# Patient Record
Sex: Male | Born: 1955
Health system: Southern US, Community
[De-identification: ages and names within clinical notes are randomized; demographics above are authoritative.]

## PROBLEM LIST (undated history)

## (undated) DIAGNOSIS — N189 Chronic kidney disease, unspecified: Secondary | ICD-10-CM

## (undated) DIAGNOSIS — F32A Depression, unspecified: Secondary | ICD-10-CM

## (undated) DIAGNOSIS — F329 Major depressive disorder, single episode, unspecified: Secondary | ICD-10-CM

## (undated) DIAGNOSIS — M1990 Unspecified osteoarthritis, unspecified site: Secondary | ICD-10-CM

## (undated) DIAGNOSIS — I10 Essential (primary) hypertension: Secondary | ICD-10-CM

## (undated) HISTORY — DX: Unspecified osteoarthritis, unspecified site: M19.90

## (undated) HISTORY — PX: TONSILLECTOMY: SUR1361

## (undated) HISTORY — DX: Essential (primary) hypertension: I10

## (undated) HISTORY — DX: Chronic kidney disease, unspecified: N18.9

---

## 2011-10-13 ENCOUNTER — Emergency Department (HOSPITAL_COMMUNITY)
Admission: EM | Admit: 2011-10-13 | Discharge: 2011-10-13 | Disposition: A | Discharge status: Home / Self Care | Payer: Self-pay | Attending: Emergency Medicine | Admitting: Emergency Medicine

## 2011-10-13 ENCOUNTER — Encounter (HOSPITAL_COMMUNITY): Payer: Self-pay | Admitting: *Deleted

## 2011-10-13 DIAGNOSIS — S91009A Unspecified open wound, unspecified ankle, initial encounter: Secondary | ICD-10-CM | POA: Insufficient documentation

## 2011-10-13 DIAGNOSIS — W01119A Fall on same level from slipping, tripping and stumbling with subsequent striking against unspecified sharp object, initial encounter: Secondary | ICD-10-CM | POA: Insufficient documentation

## 2011-10-13 DIAGNOSIS — W278XXA Contact with other nonpowered hand tool, initial encounter: Secondary | ICD-10-CM | POA: Insufficient documentation

## 2011-10-13 DIAGNOSIS — IMO0002 Reserved for concepts with insufficient information to code with codable children: Secondary | ICD-10-CM

## 2011-10-13 DIAGNOSIS — S81009A Unspecified open wound, unspecified knee, initial encounter: Secondary | ICD-10-CM | POA: Insufficient documentation

## 2011-10-13 MED ORDER — CEPHALEXIN 500 MG PO CAPS: 500.0000 mg | ORAL_CAPSULE | Freq: Two times a day (BID) | ORAL | Status: DC

## 2011-10-13 NOTE — ED Provider Notes (Signed)
History  This chart was scribed for Eric Gaskins, MD by Ladona Ridgel Day. This patient was seen in room TR06C/TR06C and the patient's care was started at 1332.   CSN: [id number]  Arrival date & time 10/13/11  1332   First MD Initiated Contact with Patient 10/13/11 1359      Chief Complaint  Patient presents with  . Extremity Laceration   Patient is a 56 y.o. male presenting with skin laceration. The history is provided by the patient. No language interpreter was used.  Laceration  The incident occurred 1 to 2 hours ago. The laceration is located on the left leg. The laceration is 1 cm in size. The laceration mechanism was a a metal edge. The pain is mild. The pain has been constant since onset. It is unknown if a foreign body is present. His tetanus status is UTD.   Eric Crane is a 56 y.o. male who presents to the Emergency Department complaining of LLE laceration from the hitch of a lawnmower after falling while operating it today. His TD is UTD. He states mild blood loss but he wrapped it with an ace bandage to prevent any subsequent bleeding. He denies any other injuries. He states able to move his left foot normally and has normal sensation, no tingling or numbness.  PMH  History reviewed. No pertinent past surgical history.  History reviewed. No pertinent family history.  History  Substance Use Topics  . Smoking status: Never Smoker   . Smokeless tobacco: Not on file  . Alcohol Use: No      Review of Systems  Constitutional: Negative for fever.  Respiratory: Negative for shortness of breath.   Cardiovascular: Negative for chest pain.  Gastrointestinal: Negative for abdominal pain.  Skin: Positive for wound (laceration LLE).    Allergies  Review of patient's allergies indicates no known allergies.  Home Medications   Current Outpatient Rx  Name Route Sig Dispense Refill  . B COMPLEX PO TABS Oral Take 1 tablet by mouth daily.    Marland Kitchen GLUCOSAMINE COMPLEX PO Oral  Take 1 tablet by mouth daily.    . CO Q 10 100 MG PO CAPS Oral Take 1 capsule by mouth daily.    Marland Kitchen DHEA 50 MG PO CAPS Oral Take 1 capsule by mouth daily.    . OMEGA-3 FATTY ACIDS 1000 MG PO CAPS Oral Take 1 g by mouth daily.    Marland Kitchen MAGNESIUM 100 MG PO CAPS Oral Take 1 capsule by mouth daily.    . MULTI-VITAMIN/MINERALS PO TABS Oral Take 1 tablet by mouth daily.    Marland Kitchen VITAMIN E 400 UNITS PO CAPS Oral Take 400 Units by mouth daily.    Marland Kitchen ZINC 50 MG PO CAPS Oral Take 1 capsule by mouth daily.      Triage Vitals: BP 160/79  Pulse 55  Temp 98 F (36.7 C) (Oral)  Resp 17  SpO2 98%  Physical Exam CONSTITUTIONAL: Well developed/well nourished HEAD AND FACE: Normocephalic/atraumatic EYES: EOMI/PERRL ENMT: Mucous membranes moist NECK: supple no meningeal signs SPINE:entire spine nontender CV: S1/S2 noted, no murmurs/rubs/gallops noted LUNGS: Lungs are clear to auscultation bilaterally, no apparent distress ABDOMEN: soft, nontender, no rebound or guarding GU:no cva tenderness NEURO: Pt is awake/alert, moves all extremitiesx4 EXTREMITIES: pulses normal, full ROM.  Full rom of left ankle and no focal weakness noted with power of left foot/toes SKIN: warm, color normal, a 2 cm laceration to distal aspect of left tibia.  No bone/tendon exposed PSYCH: no  abnormalities of mood noted ED Course  LACERATION REPAIR Date/Time: 10/13/2011 2:40 PM Performed by: Manuela Schwartz Authorized by: Eric Crane Consent: Verbal consent obtained. Body area: lower extremity Location details: left lower leg Foreign bodies: no foreign bodies Tendon involvement: none Nerve involvement: none Anesthesia: local infiltration Local anesthetic: lidocaine 1% with epinephrine Irrigation solution: saline Irrigation method: syringe Amount of cleaning: standard Debridement: none Wound skin closure material used: 7 staples. Patient tolerance: Patient tolerated the procedure well with no immediate  complications. Comments: Prepped with betadine and rinsed with high pressure saline. Placed 7 staples on 2 cm LLE laceration. Patient tolerated the procedure well with no immediate complications.    DIAGNOSTIC STUDIES: Oxygen Saturation is 98% on room air, normal by my interpretation.    COORDINATION OF CARE: At 210 PM Discussed treatment plan with patient which includes wound . Patient agrees.   Pt requesting oral abx for fear of wound infection.  Xray deferred as no bony tenderness and thoroughly cleansed and no foreign bodies noted     MDM  Nursing notes including past medical history and social history reviewed and considered in documentation    I personally performed the services described in this documentation, which was scribed in my presence. The recorded information has been reviewed and considered.          Eric Gaskins, MD 10/13/11 [phone]

## 2011-10-13 NOTE — ED Notes (Addendum)
Pt with laceration to left lower extremity, sustained by lawn mower. Reports turned lawn mower off and the mower got caught on a rock and pts leg went under leg. CMS intact distally. On exam laceration appears to not have involved tendons and bones. Pt is able to flex and extend foot without difficulty.

## 2011-10-13 NOTE — ED Notes (Signed)
MD in room now .  ?

## 2011-10-13 NOTE — ED Notes (Signed)
NAD noted at time of d/c home 

## 2015-10-02 ENCOUNTER — Other Ambulatory Visit: Payer: Self-pay | Admitting: Gastroenterology

## 2015-11-06 NOTE — Progress Notes (Signed)
Spoke with pt today-states" procedure has been cancelled for 11-12-15, has spoken to Dr. Henriette CombsJohnson's office, will reschedule at a later time"

## 2015-11-12 ENCOUNTER — Ambulatory Visit (HOSPITAL_COMMUNITY): Admission: RE | Admit: 2015-11-12 | Payer: 59 | Source: Ambulatory Visit | Admitting: Gastroenterology

## 2015-11-12 ENCOUNTER — Encounter (HOSPITAL_COMMUNITY): Admission: RE | Payer: Self-pay | Source: Ambulatory Visit

## 2015-11-12 SURGERY — COLONOSCOPY WITH PROPOFOL
Anesthesia: Monitor Anesthesia Care

## 2016-01-09 ENCOUNTER — Other Ambulatory Visit: Payer: Self-pay | Admitting: Gastroenterology

## 2016-02-17 ENCOUNTER — Encounter (HOSPITAL_COMMUNITY): Payer: Self-pay

## 2016-02-18 ENCOUNTER — Ambulatory Visit (HOSPITAL_COMMUNITY)
Admission: RE | Admit: 2016-02-18 | Discharge: 2016-02-18 | Disposition: A | Discharge status: Home / Self Care | Payer: 59 | Source: Ambulatory Visit | Attending: Gastroenterology | Admitting: Gastroenterology

## 2016-02-18 ENCOUNTER — Ambulatory Visit (HOSPITAL_COMMUNITY): Payer: 59 | Admitting: Anesthesiology

## 2016-02-18 ENCOUNTER — Encounter (HOSPITAL_COMMUNITY): Payer: Self-pay

## 2016-02-18 ENCOUNTER — Encounter (HOSPITAL_COMMUNITY)
Admission: RE | Disposition: A | Discharge status: Home / Self Care | Payer: Self-pay | Source: Ambulatory Visit | Attending: Gastroenterology

## 2016-02-18 DIAGNOSIS — F319 Bipolar disorder, unspecified: Secondary | ICD-10-CM | POA: Diagnosis not present

## 2016-02-18 DIAGNOSIS — F329 Major depressive disorder, single episode, unspecified: Secondary | ICD-10-CM | POA: Insufficient documentation

## 2016-02-18 DIAGNOSIS — Z1211 Encounter for screening for malignant neoplasm of colon: Secondary | ICD-10-CM | POA: Insufficient documentation

## 2016-02-18 DIAGNOSIS — Z86010 Personal history of colonic polyps: Secondary | ICD-10-CM | POA: Insufficient documentation

## 2016-02-18 DIAGNOSIS — J45909 Unspecified asthma, uncomplicated: Secondary | ICD-10-CM | POA: Diagnosis not present

## 2016-02-18 HISTORY — DX: Major depressive disorder, single episode, unspecified: F32.9

## 2016-02-18 HISTORY — DX: Depression, unspecified: F32.A

## 2016-02-18 HISTORY — PX: COLONOSCOPY WITH PROPOFOL: SHX5780

## 2016-02-18 SURGERY — COLONOSCOPY WITH PROPOFOL
Anesthesia: Monitor Anesthesia Care

## 2016-02-18 MED ORDER — SODIUM CHLORIDE 0.9 % IV SOLN: INTRAVENOUS | Status: DC

## 2016-02-18 MED ORDER — PROPOFOL 500 MG/50ML IV EMUL
INTRAVENOUS | Status: DC | PRN
  Administered 2016-02-18: 100 ug/kg/min via INTRAVENOUS

## 2016-02-18 MED ORDER — PROPOFOL 10 MG/ML IV BOLUS
INTRAVENOUS | Status: AC
  Filled 2016-02-18: qty 40

## 2016-02-18 MED ORDER — LACTATED RINGERS IV SOLN
INTRAVENOUS | Status: DC
Start: 2016-02-18 — End: 2016-02-18
  Administered 2016-02-18: 13:00:00 via INTRAVENOUS

## 2016-02-18 MED ORDER — PROPOFOL 500 MG/50ML IV EMUL
INTRAVENOUS | Status: DC | PRN
  Administered 2016-02-18: 50 mg via INTRAVENOUS
  Administered 2016-02-18: 10 mg via INTRAVENOUS

## 2016-02-18 SURGICAL SUPPLY — 21 items

## 2016-02-18 NOTE — Transfer of Care (Signed)
Immediate Anesthesia Transfer of Care Note  Patient: Eric Crane  Procedure(s) Performed: Procedure(s): COLONOSCOPY WITH PROPOFOL (N/A)  Patient Location: PACU  Anesthesia Type:MAC  Level of Consciousness: awake, alert  and oriented  Airway & Oxygen Therapy: Patient Spontanous Breathing and Patient connected to face mask oxygen  Post-op Assessment: Report given to RN and Post -op Vital signs reviewed and stable  Post vital signs: Reviewed and stable  Last Vitals:  Vitals:   02/18/16 1241  BP: 139/65  Pulse: 60  Resp: 10  Temp: 36.4 C    Last Pain:  Vitals:   02/18/16 1241  TempSrc: Oral         Complications: No apparent anesthesia complications

## 2016-02-18 NOTE — Op Note (Signed)
[facility] Patient Name: Eric Crane Procedure Date: 02/18/2016 MRN: [id number] Attending MD: Charolett Bumpers , MD Date of Birth: 1955/08/21 CSN: [id number] Age: 61 Admit Type: Outpatient Procedure:                Colonoscopy Indications:              High risk colon cancer surveillance: Personal                            history of non-advanced adenoma Providers:                Charolett Bumpers, MD, Omelia Blackwater RN, RN,                            [facility] Tech, Technician, Roni Bread,                            CRNA Referring MD:              Medicines:                Propofol per Anesthesia Complications:            No immediate complications. Estimated Blood Loss:     Estimated blood loss: none. Procedure:                Pre-Anesthesia Assessment:                           - Prior to the procedure, a History and Physical                            was performed, and patient medications and                            allergies were reviewed. The patient's tolerance of                            previous anesthesia was also reviewed. The risks                            and benefits of the procedure and the sedation                            options and risks were discussed with the patient.                            All questions were answered, and informed consent                            was obtained. Prior Anticoagulants: The patient has                            taken aspirin, last dose was 1 day prior to                            procedure.  ASA Grade Assessment: II - A patient                            with mild systemic disease. After reviewing the                            risks and benefits, the patient was deemed in                            satisfactory condition to undergo the procedure.                           After obtaining informed consent, the colonoscope                            was passed under direct vision.  Throughout the                            procedure, the patient's blood pressure, pulse, and                            oxygen saturations were monitored continuously. The                            EC-[id number] ([id number] scope was introduced through                            the anus and advanced to the the cecum, identified                            by appendiceal orifice and ileocecal valve. The                            colonoscopy was performed without difficulty. The                            patient tolerated the procedure well. The quality                            of the bowel preparation was good. The appendiceal                            orifice and the rectum were photographed. Scope In: 1:44:58 PM Scope Out: 2:08:08 PM Scope Withdrawal Time: 0 hours 12 minutes 58 seconds  Total Procedure Duration: 0 hours 23 minutes 10 seconds  Findings:      The perianal and digital rectal examinations were normal.      The entire examined colon appeared normal. Impression:               - The entire examined colon is normal.                           - No specimens collected. Moderate Sedation:      N/A- Per Anesthesia Care Recommendation:           -  Patient has a contact number available for                            emergencies. The signs and symptoms of potential                            delayed complications were discussed with the                            patient. Return to normal activities tomorrow.                            Written discharge instructions were provided to the                            patient.                           - Repeat colonoscopy in 5 years for surveillance.                           - Resume previous diet.                           - Continue present medications. Procedure Code(s):        --- Professional ---                           Z6109, Colorectal cancer screening; colonoscopy on                            individual at high  risk Diagnosis Code(s):        --- Professional ---                           Z86.010, Personal history of colonic polyps CPT copyright 2016 American Medical Association. All rights reserved. The codes documented in this report are preliminary and upon coder review may  be revised to meet current compliance requirements. Danise Edge, MD Charolett Bumpers, MD 02/18/2016 2:18:22 PM This report has been signed electronically. Number of Addenda: 0

## 2016-02-18 NOTE — Anesthesia Preprocedure Evaluation (Signed)
Anesthesia Evaluation  Patient identified by MRN, date of birth, ID band Patient awake    Reviewed: Allergy & Precautions, NPO status , Patient's Chart, lab work & pertinent test results  History of Anesthesia Complications Negative for: history of anesthetic complications  Airway Mallampati: II  TM Distance: >3 FB Neck ROM: Full    Dental  (+) Upper Dentures, Lower Dentures   Pulmonary neg pulmonary ROS,    breath sounds clear to auscultation       Cardiovascular negative cardio ROS   Rhythm:Regular     Neuro/Psych PSYCHIATRIC DISORDERS Depression negative neurological ROS     GI/Hepatic negative GI ROS, Neg liver ROS,   Endo/Other  negative endocrine ROS  Renal/GU negative Renal ROS     Musculoskeletal negative musculoskeletal ROS (+)   Abdominal   Peds  Hematology negative hematology ROS (+)   Anesthesia Other Findings   Reproductive/Obstetrics                             Anesthesia Physical Anesthesia Plan  ASA: II  Anesthesia Plan: MAC   Post-op Pain Management:    Induction: Intravenous  Airway Management Planned: Natural Airway, Nasal Cannula and Simple Face Mask  Additional Equipment: None  Intra-op Plan:   Post-operative Plan:   Informed Consent: I have reviewed the patients History and Physical, chart, labs and discussed the procedure including the risks, benefits and alternatives for the proposed anesthesia with the patient or authorized representative who has indicated his/her understanding and acceptance.   Dental advisory given  Plan Discussed with: CRNA and Surgeon  Anesthesia Plan Comments:         Anesthesia Quick Evaluation

## 2016-02-18 NOTE — H&P (Signed)
Procedure: Surveillance colonoscopy. History of adenomatous colon polyps removed colonoscopically in the past  History: The patient is a 61 year old male born [birth year]. He is scheduled to undergo a surveillance colonoscopy today.  Past medical history: Right arm fracture surgery performed at age 61. Bipolar disorder. Asthma.  Medication allergies: None  Exam: The patient is alert and lying comfortably on the endoscopy stretcher. Abdomen is soft and nontender to palpation. Lungs are clear to auscultation. Cardiac exam reveals a regular rhythm  Plan: Proceed with surveillance colonoscopy

## 2016-02-18 NOTE — Discharge Instructions (Signed)

## 2016-02-19 ENCOUNTER — Encounter (HOSPITAL_COMMUNITY): Payer: Self-pay | Admitting: Gastroenterology

## 2016-02-19 NOTE — Anesthesia Postprocedure Evaluation (Addendum)
Anesthesia Post Note  Patient: Eric Crane  Procedure(s) Performed: Procedure(s) (LRB): COLONOSCOPY WITH PROPOFOL (N/A)  Patient location during evaluation: PACU Anesthesia Type: MAC Level of consciousness: awake and alert Pain management: pain level controlled Vital Signs Assessment: post-procedure vital signs reviewed and stable Respiratory status: spontaneous breathing, nonlabored ventilation, respiratory function stable and patient connected to nasal cannula oxygen Cardiovascular status: stable and blood pressure returned to baseline Anesthetic complications: no       Last Vitals:  Vitals:   02/18/16 1417 02/18/16 1440  BP: 131/70 (!) 173/88  Pulse: 63   Resp: 12   Temp: 36.4 C     Last Pain:  Vitals:   02/18/16 1417  TempSrc: Oral                 Keyshun Elpers

## 2016-03-09 ENCOUNTER — Other Ambulatory Visit: Payer: Self-pay | Admitting: Internal Medicine

## 2016-03-09 ENCOUNTER — Ambulatory Visit
Admission: RE | Admit: 2016-03-09 | Discharge: 2016-03-09 | Disposition: A | Discharge status: Home / Self Care | Payer: 59 | Source: Ambulatory Visit | Attending: Internal Medicine | Admitting: Internal Medicine

## 2016-03-09 DIAGNOSIS — R1032 Left lower quadrant pain: Secondary | ICD-10-CM

## 2016-03-09 DIAGNOSIS — M25552 Pain in left hip: Secondary | ICD-10-CM | POA: Diagnosis not present

## 2016-06-01 DIAGNOSIS — H40013 Open angle with borderline findings, low risk, bilateral: Secondary | ICD-10-CM | POA: Diagnosis not present

## 2016-06-29 NOTE — Addendum Note (Signed)
Addendum  created 06/29/16 1024 by Val EagleMoser, Jermiya Reichl, MD   Sign clinical note

## 2016-09-29 DIAGNOSIS — Z0000 Encounter for general adult medical examination without abnormal findings: Secondary | ICD-10-CM | POA: Diagnosis not present

## 2016-09-29 DIAGNOSIS — E7800 Pure hypercholesterolemia, unspecified: Secondary | ICD-10-CM | POA: Diagnosis not present

## 2016-09-29 DIAGNOSIS — Z5181 Encounter for therapeutic drug level monitoring: Secondary | ICD-10-CM | POA: Diagnosis not present

## 2016-12-14 DIAGNOSIS — H40013 Open angle with borderline findings, low risk, bilateral: Secondary | ICD-10-CM | POA: Diagnosis not present

## 2017-06-14 DIAGNOSIS — H40013 Open angle with borderline findings, low risk, bilateral: Secondary | ICD-10-CM | POA: Diagnosis not present

## 2017-10-04 DIAGNOSIS — E7800 Pure hypercholesterolemia, unspecified: Secondary | ICD-10-CM | POA: Diagnosis not present

## 2017-10-04 DIAGNOSIS — R739 Hyperglycemia, unspecified: Secondary | ICD-10-CM | POA: Diagnosis not present

## 2017-12-15 DIAGNOSIS — H40013 Open angle with borderline findings, low risk, bilateral: Secondary | ICD-10-CM | POA: Diagnosis not present

## 2018-01-25 DIAGNOSIS — H6123 Impacted cerumen, bilateral: Secondary | ICD-10-CM | POA: Diagnosis not present

## 2018-06-15 DIAGNOSIS — H40023 Open angle with borderline findings, high risk, bilateral: Secondary | ICD-10-CM | POA: Diagnosis not present

## 2018-12-30 ENCOUNTER — Ambulatory Visit (INDEPENDENT_AMBULATORY_CARE_PROVIDER_SITE_OTHER): Payer: 59 | Admitting: Physician Assistant

## 2018-12-30 ENCOUNTER — Encounter: Payer: Self-pay | Admitting: Physician Assistant

## 2018-12-30 ENCOUNTER — Ambulatory Visit: Payer: Self-pay

## 2018-12-30 ENCOUNTER — Other Ambulatory Visit: Payer: Self-pay

## 2018-12-30 ENCOUNTER — Ambulatory Visit (INDEPENDENT_AMBULATORY_CARE_PROVIDER_SITE_OTHER): Payer: 59

## 2018-12-30 VITALS — Ht 68.0 in | Wt 183.0 lb

## 2018-12-30 DIAGNOSIS — M25561 Pain in right knee: Secondary | ICD-10-CM | POA: Diagnosis not present

## 2018-12-30 NOTE — Progress Notes (Addendum)
Office Visit Note   Patient: Eric Crane           Date of Birth: 1955/10/28           MRN: [id number] Visit Date: 12/30/2018              Requested by: Kirby Funk, MD 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    Assessment & Plan: Visit Diagnoses:  1. Right knee pain, unspecified chronicity     Plan: Clinical exam and history most consistent with arthritis of the right knee with a ruptured Baker's cyst.  I would like for him to work on ankle range of motion and I also would like for him to use a compression sock 24/7.  He does not have any pain with active or passive dorsiflexion of the ankle and a negative Homans' sign and no focal tenderness in the back of the calf.  However I did tell him if these symptoms changed or he began having the symptoms he needed to be evaluated urgently he will follow up in 4 weeks  Follow-Up Instructions: No follow-ups on file.   Ortho Exam  Patient is alert, oriented, no adenopathy, well-dressed, normal affect, normal respiratory effort. Right knee moderate amount of soft tissue swelling extending from the knee into the foot both his dorsalis pedis and his posterior tibial pulses are palpable and strong.  He has a moderate amount of soft tissue swelling but no discoloration.  He does also have some pain over the medial joint line and varus alignment of his leg.  Compartments are  compressible.Distal sensation is intact. No pain in calf with passive stretch. Active Ankle ROM is intact.   Imaging: No results found. No images are attached to the encounter.  Labs: No results found for: HGBA1C, ESRSEDRATE, CRP, LABURIC, REPTSTATUS, GRAMSTAIN, CULT, LABORGA   No results found for: ALBUMIN, PREALBUMIN, LABURIC  No results found for: MG No results found for: VD25OH  No results found for: PREALBUMIN No flowsheet data found.   Body mass index is 27.83 kg/m.  Orders:  Orders Placed This Encounter  Procedures   XR Knee 1-2 Views Right   XR Tibia/Fibula Right   No orders of the defined types were placed in this encounter.    Procedures: No procedures performed  Clinical Data: No additional findings.  ROS:  All other systems negative, except as noted in the HPI. Review of Systems  Objective: Vital Signs: Ht 5\' 8"  (1.727 m)    Wt 183 lb (83 kg)    BMI 27.83 kg/m   Specialty Comments:  No specialty comments available.  PMFS History: There are no active problems to display for this patient.  Past Medical History:  Diagnosis Date   Depression     No family history on file.  Past Surgical History:  Procedure Laterality Date  COLONOSCOPY WITH PROPOFOL N/A 02/18/2016   Procedure: COLONOSCOPY WITH PROPOFOL;  Surgeon: Garlan Fair, MD;  Location: [facility];  Service: Endoscopy;  Laterality: N/A;   Social History   Occupational History   Not on file  Tobacco Use   Smoking status: Never Smoker   Smokeless tobacco: Never Used  Substance and Sexual Activity   Alcohol use: No   Drug use: No   Sexual activity: Not on file

## 2019-01-02 ENCOUNTER — Telehealth: Payer: Self-pay | Admitting: Physician Assistant

## 2019-01-02 MED ORDER — IBUPROFEN 800 MG PO TABS: 800.0000 mg | ORAL_TABLET | Freq: Three times a day (TID) | ORAL | 1 refills | Status: DC | PRN

## 2019-01-02 NOTE — Telephone Encounter (Signed)
Patient called and requesting am inflammatory medication for swelling in legs. Patient states he has taken Advil and tylenol and swelling has not gone down. Patient seen PA Audrea Muscat Person 12/30/2018. Patient would like medication to be sent to [facility] in [place]. Patient phone number is [phone]

## 2019-01-02 NOTE — Telephone Encounter (Signed)
I left a message for the patient. He is supposed to be wearing a compression sock. If he is and the swelling is not getting better I would like for him to get a doppler US of the leg. If He feels he is getting better but still has swelling we can try  Ibuprofen 600 TID with food 90.

## 2019-01-02 NOTE — Telephone Encounter (Signed)
Ok, thank you

## 2019-01-02 NOTE — Addendum Note (Signed)
Addended by: Georgette Dover on: 01/02/2019 03:19 PM   Modules accepted: Orders

## 2019-01-02 NOTE — Telephone Encounter (Signed)
Swelling is actually getting better. He just would like the NSAID to help things along

## 2019-01-02 NOTE — Telephone Encounter (Signed)
Eric Crane please advise, thank you. 

## 2019-01-13 ENCOUNTER — Other Ambulatory Visit: Payer: Self-pay

## 2019-01-13 ENCOUNTER — Ambulatory Visit (INDEPENDENT_AMBULATORY_CARE_PROVIDER_SITE_OTHER): Payer: 59 | Admitting: Physician Assistant

## 2019-01-13 ENCOUNTER — Encounter: Payer: Self-pay | Admitting: Physician Assistant

## 2019-01-13 VITALS — Ht 68.0 in | Wt 183.0 lb

## 2019-01-13 DIAGNOSIS — M25561 Pain in right knee: Secondary | ICD-10-CM

## 2019-01-13 MED ORDER — METHYLPREDNISOLONE ACETATE 40 MG/ML IJ SUSP
40.0000 mg | INTRAMUSCULAR | Status: AC | PRN
  Administered 2019-01-13: 15:00:00 40 mg via INTRA_ARTICULAR

## 2019-01-13 MED ORDER — LIDOCAINE HCL 1 % IJ SOLN
5.0000 mL | INTRAMUSCULAR | Status: AC | PRN
  Administered 2019-01-13: 5 mL

## 2019-01-13 NOTE — Progress Notes (Signed)
Office Visit Note   Patient: Eric Crane           Date of Birth: 1955/11/22           MRN: [id number] Visit Date: 01/13/2019              Requested by: Lavone Orn, MD [address] [place],  [place] [address] PCP: Lavone Orn, MD  Chief Complaint  Patient presents with  . Right Knee - Pain      HPI: This is a pleasant 63 year old gentleman that I have been following for right knee pain and lower leg swelling the swelling in his calf has gotten significantly better with compression socks.  But still remains is aching and pain in his knee.  He focuses around the joint line.  He denies any locking or popping he does feel like he still has a little swelling and going down stairs is difficult he denies any instability  Assessment & Plan: Visit Diagnoses: No diagnosis found.  Plan: He will follow up in 3 weeks.  If he does not have significant relief I told him the next step may be an MRI.  He is in agreement with the plan  Follow-Up Instructions: No follow-ups on file.   Ortho Exam  Patient is alert, oriented, no adenopathy, well-dressed, normal affect, normal respiratory effort. Right knee: Mild soft tissue swelling no cellulitis or erythema tenderness along the joint line increased with terminal flexion  Imaging: No results found. No images are attached to the encounter.  Labs: No results found for: HGBA1C, ESRSEDRATE, CRP, LABURIC, REPTSTATUS, GRAMSTAIN, CULT, LABORGA   No results found for: ALBUMIN, PREALBUMIN, LABURIC  No results found for: MG No results found for: VD25OH  No results found for: PREALBUMIN No flowsheet data found.   Body mass index is 27.83 kg/m.  Orders:  No orders of the defined types were placed in this encounter.  No orders of the defined types were placed in this encounter.    Procedures: Large Joint Inj: R knee on 01/13/2019 3:20 PM Indications: pain and diagnostic evaluation Details: 22 G 1.5 in needle,  anteromedial approach  Arthrogram: No  Medications: 40 mg methylPREDNISolone acetate 40 MG/ML; 5 mL lidocaine 1 % Outcome: tolerated well, no immediate complications Procedure, treatment alternatives, risks and benefits explained, specific risks discussed. Consent was given by the patient. Immediately prior to procedure a time out was called to verify the correct patient, procedure, equipment, support staff and site/side marked as required. Patient was prepped and draped in the usual sterile fashion.      Clinical Data: No additional findings.  ROS:  All other systems negative, except as noted in the HPI. Review of Systems  Objective: Vital Signs: Ht 5\' 8"  (1.727 m)   Wt 183 lb (83 kg)   BMI 27.83 kg/m   Specialty Comments:  No specialty comments available.  PMFS History: There are no problems to display for this patient.  Past Medical History:  Diagnosis Date  . Depression     No family history on file.  Past Surgical History:  Procedure Laterality Date  . COLONOSCOPY WITH PROPOFOL N/A 02/18/2016   Procedure: COLONOSCOPY WITH PROPOFOL;  Surgeon: Garlan Fair, MD;  Location: [facility];  Service: Endoscopy;  Laterality: N/A;   Social History   Occupational History  . Not on file  Tobacco Use  . Smoking status: Never Smoker  . Smokeless tobacco: Never Used  Substance and Sexual Activity  . Alcohol  use: No  . Drug use: No  . Sexual activity: Not on file

## 2019-02-03 ENCOUNTER — Ambulatory Visit: Payer: 59 | Admitting: Physician Assistant

## 2019-02-07 ENCOUNTER — Encounter: Payer: Self-pay | Admitting: Physician Assistant

## 2019-02-07 ENCOUNTER — Other Ambulatory Visit: Payer: Self-pay

## 2019-02-07 ENCOUNTER — Ambulatory Visit (INDEPENDENT_AMBULATORY_CARE_PROVIDER_SITE_OTHER): Payer: 59 | Admitting: Physician Assistant

## 2019-02-07 VITALS — Ht 68.0 in | Wt 183.0 lb

## 2019-02-07 DIAGNOSIS — M25561 Pain in right knee: Secondary | ICD-10-CM

## 2019-02-07 MED ORDER — IBUPROFEN 800 MG PO TABS: 800.0000 mg | ORAL_TABLET | Freq: Two times a day (BID) | ORAL | 2 refills | Status: DC | PRN

## 2019-02-07 NOTE — Progress Notes (Signed)
Office Visit Note   Patient: Eric Crane           Date of Birth: Mar 08, 1955           MRN: [id number] Visit Date: 02/07/2019              Requested by: Kirby Funk, MD [address] [place],  [place] [address] PCP: Kirby Funk, MD  Chief Complaint  Patient presents with  . Right Knee - Follow-up    01/13/19 s/p cortisone injection       HPI: This is a pleasant 64 year old gentleman seen today in follow-up for right knee pain as well as some swelling to his right lower leg.  He has had good relief of the swelling with compression garments and is wearing these today.  He continues to have some aching medial knee pain.  With occasional swelling and pain extending into the popliteal fossa.  He did have a Depo-Medrol injection about 3 weeks ago which she states has worked well he is now having a variable amount of aching knee pain this is worse with weightbearing.  He feels comfortable tolerating this level of pain.  Would not like to proceed with any further evaluation.   Assessment & Plan: Visit Diagnoses: No diagnosis found.  Plan: He will follow up as needed.  We will refill his ibuprofen as this is providing him adequate relief.  Follow-Up Instructions: Return if symptoms worsen or fail to improve.   Ortho Exam  Patient is alert, oriented, no adenopathy, well-dressed, normal affect, normal respiratory effort. Exam unchanged. Right knee: Mild soft tissue swelling no cellulitis or erythema. tenderness along the joint line increased with terminal flexion  Imaging: No results found. No images are attached to the encounter.  Labs: No results found for: HGBA1C, ESRSEDRATE, CRP, LABURIC, REPTSTATUS, GRAMSTAIN, CULT, LABORGA   No results found for: ALBUMIN, PREALBUMIN, LABURIC  No results found for: MG No results found for: VD25OH  No results found for: PREALBUMIN No flowsheet data found.   Body mass index is 27.83 kg/m.  Orders:  No orders of  the defined types were placed in this encounter.  Meds ordered this encounter  Medications  . ibuprofen (ADVIL) 800 MG tablet    Sig: Take 1 tablet (800 mg total) by mouth 2 (two) times daily as needed.    Dispense:  60 tablet    Refill:  2     Procedures: No procedures performed  Clinical Data: No additional findings.  ROS:  All other systems negative, except as noted in the HPI. Review of Systems  Constitutional: Negative for chills and fever.  Musculoskeletal: Positive for arthralgias and joint swelling.    Objective: Vital Signs: Ht 5\' 8"  (1.727 m)   Wt 183 lb (83 kg)   BMI 27.83 kg/m   Specialty Comments:  No specialty comments available.  PMFS History: There are no problems to display for this patient.  Past Medical History:  Diagnosis Date  . Depression     History reviewed. No pertinent family history.  Past Surgical History:  Procedure Laterality Date  . COLONOSCOPY WITH PROPOFOL N/A 02/18/2016   Procedure: COLONOSCOPY WITH PROPOFOL;  Surgeon: 02/20/2016, MD;  Location: [facility];  Service: Endoscopy;  Laterality: N/A;   Social History   Occupational History  . Not on file  Tobacco Use  . Smoking status: Never Smoker  . Smokeless tobacco: Never Used  Substance and Sexual Activity  . Alcohol use: No  .  Drug use: No  . Sexual activity: Not on file       

## 2019-02-24 ENCOUNTER — Other Ambulatory Visit: Payer: Self-pay | Admitting: Family

## 2019-02-24 MED ORDER — IBUPROFEN 800 MG PO TABS: 800.0000 mg | ORAL_TABLET | Freq: Two times a day (BID) | ORAL | 0 refills | Status: DC | PRN

## 2019-04-24 ENCOUNTER — Telehealth: Payer: Self-pay | Admitting: Physician Assistant

## 2019-04-24 NOTE — Telephone Encounter (Signed)
Pls advise. Thanks.  

## 2019-04-24 NOTE — Telephone Encounter (Signed)
This is for Eric Crane F

## 2019-04-24 NOTE — Telephone Encounter (Signed)
Patient stated he had seen [place] and she diagnosed him with a torn  monecious and it is still giving him problems. Patient stated that he was told the   next step would be an MRI.  Please call patient to advise.  [phone]

## 2019-04-24 NOTE — Telephone Encounter (Signed)
Note for Eric Crane

## 2019-04-26 ENCOUNTER — Other Ambulatory Visit: Payer: Self-pay | Admitting: Physician Assistant

## 2019-04-26 MED ORDER — PREDNISONE 10 MG PO TABS: 20.0000 mg | ORAL_TABLET | Freq: Every day | ORAL | 1 refills | Status: DC

## 2019-04-26 NOTE — Telephone Encounter (Signed)
Appt cx.

## 2019-04-26 NOTE — Telephone Encounter (Signed)
I spoke with the patient today. He is having shooting pain from his lower back down his leg. He thinks it is from his altered gate  from when his knee was sore. No weakness . Advil has helped wondering if there is anything else. I called him in some prednisone to take with food. He should stop other NSAIDS.  Follow up if no better  Can we cancel his appointment that he had next Wed Thanks

## 2019-05-03 ENCOUNTER — Ambulatory Visit: Payer: 59 | Admitting: Physician Assistant

## 2019-07-13 ENCOUNTER — Telehealth: Payer: Self-pay | Admitting: Orthopedic Surgery

## 2019-07-13 NOTE — Telephone Encounter (Signed)
Pt would like to see if he can be prescribed some steroids for the back pain radiating down his leg; pt would like a CB letting him know wether this will be possible or not?   [phone]

## 2019-07-13 NOTE — Telephone Encounter (Signed)
Can you please call and make an appt? We have not treated him for his back and his last appt was in January. Will need appt for eval before we can treat.

## 2019-07-14 ENCOUNTER — Ambulatory Visit (INDEPENDENT_AMBULATORY_CARE_PROVIDER_SITE_OTHER): Payer: 59 | Admitting: Physician Assistant

## 2019-07-14 ENCOUNTER — Other Ambulatory Visit: Payer: Self-pay

## 2019-07-14 ENCOUNTER — Encounter: Payer: Self-pay | Admitting: Physician Assistant

## 2019-07-14 ENCOUNTER — Ambulatory Visit: Payer: Self-pay

## 2019-07-14 VITALS — Ht 68.0 in | Wt 183.0 lb

## 2019-07-14 DIAGNOSIS — M545 Low back pain, unspecified: Secondary | ICD-10-CM

## 2019-07-14 MED ORDER — PREDNISONE 10 MG PO TABS: 20.0000 mg | ORAL_TABLET | Freq: Every day | ORAL | 1 refills | Status: DC

## 2019-07-14 NOTE — Progress Notes (Signed)
Office Visit Note   Patient: Eric Crane           Date of Birth: [birth year]           MRN: [id number] Visit Date: 07/14/2019              Requested by: Kirby Funk, MD [address] [place],  [place] [address] PCP: Kirby Funk, MD  Chief Complaint  Patient presents with  . Right Leg - Pain, Follow-up      HPI: This is a pleasant gentleman who was seen for right knee issues in the past.  He had been taking a course of steroids for lower back pain going into his right leg.  Prior to giving him any more steroids he felt this need to be evaluated.  He denies any previous difficulties with this back many years ago he had similar symptoms.  He denies any weakness  Assessment & Plan: Visit Diagnoses:  1. Low back pain, unspecified back pain laterality, unspecified chronicity, unspecified whether sciatica present     Plan: Patient has significant degenerative changes in his spine and a scoliosis in the lower back.  Surprisingly he has been very asymptomatic until recently.  I will renew his steroids once however he knows that this will be the final time if his symptoms return we recommend an MRI and follow-up with Dr. Alvester Morin.  Also if he gets any significant symptoms such as weakness he should contact us immediately  Follow-Up Instructions: No follow-ups on file.   Ortho Exam  Patient is alert, oriented, no adenopathy, well-dressed, normal affect, normal respiratory effort. Focused examination of his spine demonstrates no abnormality.  He has good plantar flexion dorsiflexion on the right side some tenderness in the lower back.  Negative straight leg raise.  Imaging: No results found. No images are attached to the encounter.  Labs: No results found for: HGBA1C, ESRSEDRATE, CRP, LABURIC, REPTSTATUS, GRAMSTAIN, CULT, LABORGA   No results found for: ALBUMIN, PREALBUMIN, LABURIC  No results found for: MG No results found for: VD25OH  No results found for:  PREALBUMIN No flowsheet data found.   Body mass index is 27.83 kg/m.  Orders:  Orders Placed This Encounter  Procedures  . XR Lumbar Spine 2-3 Views   Meds ordered this encounter  Medications  . predniSONE (DELTASONE) 10 MG tablet    Sig: Take 2 tablets (20 mg total) by mouth daily with breakfast.    Dispense:  50 tablet    Refill:  1     Procedures: No procedures performed  Clinical Data: No additional findings.  ROS:  All other systems negative, except as noted in the HPI. Review of Systems  Objective: Vital Signs: Ht 5\' 8"  (1.727 m)   Wt 183 lb (83 kg)   BMI 27.83 kg/m   Specialty Comments:  No specialty comments available.  PMFS History: There are no problems to display for this patient.  Past Medical History:  Diagnosis Date  . Depression     No family history on file.  Past Surgical History:  Procedure Laterality Date  . COLONOSCOPY WITH PROPOFOL N/A 02/18/2016   Procedure: COLONOSCOPY WITH PROPOFOL;  Surgeon: 02/20/2016, MD;  Location: [facility];  Service: Endoscopy;  Laterality: N/A;   Social History   Occupational History  . Not on file  Tobacco Use  . Smoking status: Never Smoker  . Smokeless tobacco: Never Used  Substance and Sexual Activity  . Alcohol use: No  .  Drug use: No  . Sexual activity: Not on file       

## 2019-09-11 ENCOUNTER — Other Ambulatory Visit: Payer: Self-pay

## 2019-09-11 ENCOUNTER — Telehealth: Payer: Self-pay | Admitting: Physician Assistant

## 2019-09-11 DIAGNOSIS — M545 Low back pain, unspecified: Secondary | ICD-10-CM

## 2019-09-11 NOTE — Telephone Encounter (Signed)
Called pt to advise that order has been placed and that once pre cert is obtained will call to sch procedure. I did fail to note in the order that [facility] advised follow up with FN after MRI obtained. Can you please include this? Otherwise pt is aware and will await call.

## 2019-09-11 NOTE — Telephone Encounter (Signed)
Patient called requesting PA Persons send referral for MRI. Patient states back is worse and need to be scheduled. Please call patient about this matter at [phone].

## 2019-09-12 NOTE — Telephone Encounter (Signed)
Noted in referral notes 

## 2019-09-19 ENCOUNTER — Other Ambulatory Visit: Payer: Self-pay | Admitting: Physician Assistant

## 2019-09-19 ENCOUNTER — Telehealth: Payer: Self-pay | Admitting: Physician Assistant

## 2019-09-19 MED ORDER — PREDNISONE 10 MG PO TABS: 20.0000 mg | ORAL_TABLET | Freq: Every day | ORAL | 1 refills | Status: DC

## 2019-09-19 NOTE — Telephone Encounter (Signed)
I called and lm on vm for pt to advise that rx has been sent to the pharm.

## 2019-09-19 NOTE — Telephone Encounter (Signed)
Patient called requesting a refill of steroid medication. Patient states his appt is set for MRI and the steroid medication is his only pain relief. Please call patient when medication has been sent to pharmacy on file. Patient phone number is [phone].

## 2019-09-19 NOTE — Telephone Encounter (Signed)
Please see message and advise 

## 2019-09-19 NOTE — Telephone Encounter (Signed)
Rx sent to [facility]

## 2019-09-20 ENCOUNTER — Telehealth: Payer: Self-pay | Admitting: Physician Assistant

## 2019-09-20 NOTE — Telephone Encounter (Signed)
Pt called and sw triage and advised that he will pick up rx at the pharm that we sent rx to.

## 2019-09-20 NOTE — Telephone Encounter (Signed)
Pt called stating his rx was sent to the wrong pharmacy; he would like [place] to send it to the [facility] [place] on [address]

## 2019-09-27 ENCOUNTER — Ambulatory Visit
Admission: RE | Admit: 2019-09-27 | Discharge: 2019-09-27 | Disposition: A | Discharge status: Home / Self Care | Payer: 59 | Source: Ambulatory Visit | Attending: Physician Assistant | Admitting: Physician Assistant

## 2019-09-27 DIAGNOSIS — M545 Low back pain, unspecified: Secondary | ICD-10-CM

## 2019-10-03 ENCOUNTER — Ambulatory Visit (INDEPENDENT_AMBULATORY_CARE_PROVIDER_SITE_OTHER): Payer: 59 | Admitting: Orthopedic Surgery

## 2019-10-03 ENCOUNTER — Encounter: Payer: Self-pay | Admitting: Orthopedic Surgery

## 2019-10-03 VITALS — Ht 68.0 in | Wt 183.0 lb

## 2019-10-03 DIAGNOSIS — M5416 Radiculopathy, lumbar region: Secondary | ICD-10-CM

## 2019-10-03 DIAGNOSIS — M48061 Spinal stenosis, lumbar region without neurogenic claudication: Secondary | ICD-10-CM | POA: Diagnosis not present

## 2019-10-03 NOTE — Progress Notes (Signed)
   Office Visit Note   Patient: Eric Crane           Date of Birth: May 04, 1955           MRN: [id number] Visit Date: 10/03/2019              Requested by: Kirby Funk, MD [address] [place],  [place] [address] PCP: Kirby Funk, MD  Chief Complaint  Patient presents with  . Lower Back - Follow-up    MRI review L spine       HPI: Patient is a 34 gentleman who presents in follow-up status post MRI scan for his right-sided radicular pain.  Patient states he feels like this all started when he was limping.  He states the prednisone has provided some temporary relief.  Assessment & Plan: Visit Diagnoses:  1. Spinal stenosis of lumbar region without neurogenic claudication   2. Radiculopathy, lumbar region     Plan: With the MRI scan findings showing these severe foraminal impingement on the right at L5-S1 and L4-5 feel the patient should benefit from a transforaminal injection.  A referral has been made to Dr. Alvester Morin.  Follow-Up Instructions: No follow-ups on file.   Ortho Exam  Patient is alert, oriented, no adenopathy, well-dressed, normal affect, normal respiratory effort. Examination patient has a negative straight leg raise bilaterally good motor strength in all motor groups of both lower extremities he has radicular pain from his lumbar spine down the lateral aspect of the right leg.  Review of the MRI scan shows a high-grade foraminal impingement at the right at L4-5 and L5-S1 consistent with his symptoms.  Patient also has further degenerative changes but do not seem consistent with his clinical findings.  Imaging: No results found. No images are attached to the encounter.  Labs: No results found for: HGBA1C, ESRSEDRATE, CRP, LABURIC, REPTSTATUS, GRAMSTAIN, CULT, LABORGA   No results found for: ALBUMIN, PREALBUMIN, LABURIC  No results found for: MG No results found for: VD25OH  No results found for: PREALBUMIN No flowsheet data  found.   Body mass index is 27.83 kg/m.  Orders:  Orders Placed This Encounter  Procedures  . Ambulatory referral to Physical Medicine Rehab   No orders of the defined types were placed in this encounter.    Procedures: No procedures performed  Clinical Data: No additional findings.  ROS:  All other systems negative, except as noted in the HPI. Review of Systems  Objective: Vital Signs: Ht 5\' 8"  (1.727 m)   Wt 183 lb (83 kg)   BMI 27.83 kg/m   Specialty Comments:  No specialty comments available.  PMFS History: There are no problems to display for this patient.  Past Medical History:  Diagnosis Date  . Depression     History reviewed. No pertinent family history.  Past Surgical History:  Procedure Laterality Date  . COLONOSCOPY WITH PROPOFOL N/A 02/18/2016   Procedure: COLONOSCOPY WITH PROPOFOL;  Surgeon: 02/20/2016, MD;  Location: [facility];  Service: Endoscopy;  Laterality: N/A;   Social History   Occupational History  . Not on file  Tobacco Use  . Smoking status: Never Smoker  . Smokeless tobacco: Never Used  Substance and Sexual Activity  . Alcohol use: No  . Drug use: No  . Sexual activity: Not on file

## 2019-10-09 ENCOUNTER — Telehealth: Payer: Self-pay | Admitting: Physical Medicine and Rehabilitation

## 2019-10-09 NOTE — Telephone Encounter (Signed)
Please advise 

## 2019-10-09 NOTE — Telephone Encounter (Signed)
Pt called checking on his referral for Dr.Newton; states his pain is getting a worse and he would like to get scheduled as soon as possible.   [phone]

## 2019-10-10 NOTE — Telephone Encounter (Signed)
Called pt and sch for 10/26/19

## 2019-10-26 ENCOUNTER — Encounter: Payer: Self-pay | Admitting: Physical Medicine and Rehabilitation

## 2019-10-26 ENCOUNTER — Ambulatory Visit (INDEPENDENT_AMBULATORY_CARE_PROVIDER_SITE_OTHER): Payer: 59 | Admitting: Physical Medicine and Rehabilitation

## 2019-10-26 ENCOUNTER — Other Ambulatory Visit: Payer: Self-pay

## 2019-10-26 ENCOUNTER — Ambulatory Visit: Payer: Self-pay

## 2019-10-26 VITALS — BP 141/75 | HR 60

## 2019-10-26 DIAGNOSIS — M5416 Radiculopathy, lumbar region: Secondary | ICD-10-CM

## 2019-10-26 MED ORDER — BETAMETHASONE SOD PHOS & ACET 6 (3-3) MG/ML IJ SUSP
12.0000 mg | Freq: Once | INTRAMUSCULAR | Status: AC
  Administered 2019-10-26: 12 mg

## 2019-10-26 NOTE — Progress Notes (Signed)
Pt state lower back pain that travels down his right leg. Pt state walking on har floors at work. Pt state he waits till it goes a way or bringing hes knees to his chest mhelps ease the pain. Pt state pain meds helps.  Numeric Pain Rating Scale and Functional Assessment Average Pain 3   In the last MONTH (on 0-10 scale) has pain interfered with the following?  1. General activity like being  able to carry out your everyday physical activities such as walking, climbing stairs, carrying groceries, or moving a chair?  Rating(4)   +Driver, -BT, -Dye Allergies.

## 2019-10-26 NOTE — Progress Notes (Signed)
Eric Crane - 64 y.o. male MRN [id number]  Date of birth: Jun 11, 1955  Office Visit Note: Visit Date: 10/26/2019 PCP: Kirby Funk, MD Referred by: Kirby Funk, MD  Subjective: Chief Complaint  Patient presents with  . Lower Back - Pain  . Right Leg - Pain   HPI:  Eric Crane is a 64 y.o. male who comes in today at the request of Dr. Aldean Baker for planned Right L5-S1 Lumbar epidural steroid injection with fluoroscopic guidance.  The patient has failed conservative care including home exercise, medications, time and activity modification.  This injection will be diagnostic and hopefully therapeutic.  Please see requesting physician notes for further details and justification.  MRI reviewed with images and spine model.  MRI reviewed in the note below.   ROS Otherwise per HPI.  Assessment & Plan: Visit Diagnoses:  1. Lumbar radiculopathy     Plan: No additional findings.   Meds & Orders:  Meds ordered this encounter  Medications  . betamethasone acetate-betamethasone sodium phosphate (CELESTONE) injection 12 mg    Orders Placed This Encounter  Procedures  . XR C-ARM NO REPORT  . Epidural Steroid injection    Follow-up: Return if symptoms worsen or fail to improve.   Procedures: No procedures performed  Lumbosacral Transforaminal Epidural Steroid Injection - Sub-Pedicular Approach with Fluoroscopic Guidance  Patient: Eric Crane      Date of Birth: May 01, 1955 MRN: [id number] PCP: Kirby Funk, MD      Visit Date: 10/26/2019   Universal Protocol:    Date/Time: 10/26/2019  Consent Given By: the patient  Position: PRONE  Additional Comments: Vital signs were monitored before and after the procedure. Patient was prepped and draped in the usual sterile fashion. The correct patient, procedure, and site was verified.   Injection Procedure Details:  Procedure Site One Meds Administered:  Meds ordered this encounter  Medications  .  betamethasone acetate-betamethasone sodium phosphate (CELESTONE) injection 12 mg    Laterality: Right  Location/Site:  L5-S1  Needle size: 22 G  Needle type: Spinal  Needle Placement: Transforaminal  Findings:    -Comments: Excellent flow of contrast along the nerve, nerve root and into the epidural space.  Procedure Details: After squaring off the end-plates to get a true AP view, the C-arm was positioned so that an oblique view of the foramen as noted above was visualized. The target area is just inferior to the "nose of the scotty dog" or sub pedicular. The soft tissues overlying this structure were infiltrated with 2-3 ml. of 1% Lidocaine without Epinephrine.  The spinal needle was inserted toward the target using a "trajectory" view along the fluoroscope beam.  Under AP and lateral visualization, the needle was advanced so it did not puncture dura and was located close the 6 O'Clock position of the pedical in AP tracterory. Biplanar projections were used to confirm position. Aspiration was confirmed to be negative for CSF and/or blood. A 1-2 ml. volume of Isovue-250 was injected and flow of contrast was noted at each level. Radiographs were obtained for documentation purposes.   After attaining the desired flow of contrast documented above, a 0.5 to 1.0 ml test dose of 0.25% Marcaine was injected into each respective transforaminal space.  The patient was observed for 90 seconds post injection.  After no sensory deficits were reported, and normal lower extremity motor function was noted,   the above injectate was administered so that equal amounts of the injectate were placed at each foramen (level) into the  transforaminal epidural space.   Additional Comments:  The patient tolerated the procedure well Dressing: 2 x 2 sterile gauze and Band-Aid    Post-procedure details: Patient was observed during the procedure. Post-procedure instructions were reviewed.  Patient left the  clinic in stable condition.      Clinical History: Low back pain with right leg and hip pain.  EXAM: MRI LUMBAR SPINE WITHOUT CONTRAST  TECHNIQUE: Multiplanar, multisequence MR imaging of the lumbar spine was performed. No intravenous contrast was administered.  COMPARISON:  Lumbar radiography 07/14/2019  FINDINGS: Segmentation: Presuming hypoplastic ribs at T12 there are 5 lumbar type vertebrae.  Alignment:  Dextroscoliosis.  L5-S1 anterolisthesis.  Vertebrae: No evidence of fracture, discitis, or aggressive bone lesion.  Conus medullaris and cauda equina: Conus extends to the T12-L1 level. Conus and cauda equina appear normal.  Paraspinal and other soft tissues: Bilateral renal cystic intensities.  Disc levels:  T12- L1: Asymmetric leftward disc narrowing and bulging with endplate ridging. Asymmetric left facet spurring. Moderate left foraminal narrowing. Patent spinal canal. Bulky left far-lateral spur deforming the upper psoas.  L1-L2: Asymmetric leftward disc narrowing and endplate ridging with bulging. Moderate left foraminal narrowing. The canal is patent  L2-L3: Asymmetric leftward disc narrowing and bulging with endplate ridging. Mild facet spurring.  L3-L4: Disc collapse and right eccentric bulging/ridging. Degenerative facet spurring asymmetric to the right where there is at least moderate foraminal stenosis. Moderate spinal stenosis. Asymmetric effacement of the right subarticular recess but the descending nerve root is medial and not compressed.  L4-L5: Asymmetric rightward disc collapse and far-lateral ridging. Asymmetric right degenerative facet spurring. Right foraminal impingement. The canal and bilateral subarticular recesses are patent.  L5-S1:Facet osteoarthritis with spurring and anterolisthesis. Bilateral joint effusion is present. The disc is narrowed and bulging with a right foraminal protrusion superimposed based  on sagittal images. Advanced right foraminal impingement with L5 root flattening. Right subarticular recess stenosis without static compression.  IMPRESSION: 1. Generalized degenerative disease with dextroscoliosis. 2. T12-L1 and L1-2 moderate left foraminal stenosis. A bulky left far-lateral osteophyte at T12-L1 displaces the upper psoas and could contact the upper lumbar plexus. 3. L3-4 moderate right foraminal and subarticular recess stenosis. 4. L4-5 and L5-S1 high-grade right foraminal impingement.   Electronically Signed   By: Marnee Spring M.D.   On: 09/28/2019 10:16     Objective:  VS:  HT:    WT:   BMI:     BP:(!) 141/75  HR:60bpm  TEMP: ( )  RESP:  Physical Exam Constitutional:      General: He is not in acute distress.    Appearance: Normal appearance. He is not ill-appearing.  HENT:     Head: Normocephalic and atraumatic.     Right Ear: External ear normal.     Left Ear: External ear normal.  Eyes:     Extraocular Movements: Extraocular movements intact.  Cardiovascular:     Rate and Rhythm: Normal rate.     Pulses: Normal pulses.  Abdominal:     General: There is no distension.     Palpations: Abdomen is soft.  Musculoskeletal:        General: No tenderness or signs of injury.     Right lower leg: No edema.     Left lower leg: No edema.     Comments: Patient has good distal strength without clonus.  Skin:    Findings: No erythema or rash.  Neurological:     General: No focal deficit present.  Mental Status: He is alert and oriented to person, place, and time.     Sensory: No sensory deficit.     Motor: No weakness or abnormal muscle tone.     Coordination: Coordination normal.  Psychiatric:        Mood and Affect: Mood normal.        Behavior: Behavior normal.      Imaging: No results found.

## 2019-10-26 NOTE — Procedures (Signed)
Lumbosacral Transforaminal Epidural Steroid Injection - Sub-Pedicular Approach with Fluoroscopic Guidance  Patient: Eric Crane      Date of Birth: January 26, 1956 MRN: [id number] PCP: Kirby Funk, MD      Visit Date: 10/26/2019   Universal Protocol:    Date/Time: 10/26/2019  Consent Given By: the patient  Position: PRONE  Additional Comments: Vital signs were monitored before and after the procedure. Patient was prepped and draped in the usual sterile fashion. The correct patient, procedure, and site was verified.   Injection Procedure Details:  Procedure Site One Meds Administered:  Meds ordered this encounter  Medications  . betamethasone acetate-betamethasone sodium phosphate (CELESTONE) injection 12 mg    Laterality: Right  Location/Site:  L5-S1  Needle size: 22 G  Needle type: Spinal  Needle Placement: Transforaminal  Findings:    -Comments: Excellent flow of contrast along the nerve, nerve root and into the epidural space.  Procedure Details: After squaring off the end-plates to get a true AP view, the C-arm was positioned so that an oblique view of the foramen as noted above was visualized. The target area is just inferior to the "nose of the scotty dog" or sub pedicular. The soft tissues overlying this structure were infiltrated with 2-3 ml. of 1% Lidocaine without Epinephrine.  The spinal needle was inserted toward the target using a "trajectory" view along the fluoroscope beam.  Under AP and lateral visualization, the needle was advanced so it did not puncture dura and was located close the 6 O'Clock position of the pedical in AP tracterory. Biplanar projections were used to confirm position. Aspiration was confirmed to be negative for CSF and/or blood. A 1-2 ml. volume of Isovue-250 was injected and flow of contrast was noted at each level. Radiographs were obtained for documentation purposes.   After attaining the desired flow of contrast documented  above, a 0.5 to 1.0 ml test dose of 0.25% Marcaine was injected into each respective transforaminal space.  The patient was observed for 90 seconds post injection.  After no sensory deficits were reported, and normal lower extremity motor function was noted,   the above injectate was administered so that equal amounts of the injectate were placed at each foramen (level) into the transforaminal epidural space.   Additional Comments:  The patient tolerated the procedure well Dressing: 2 x 2 sterile gauze and Band-Aid    Post-procedure details: Patient was observed during the procedure. Post-procedure instructions were reviewed.  Patient left the clinic in stable condition.

## 2019-12-17 ENCOUNTER — Other Ambulatory Visit: Payer: Self-pay

## 2019-12-17 ENCOUNTER — Emergency Department (HOSPITAL_BASED_OUTPATIENT_CLINIC_OR_DEPARTMENT_OTHER)
Admission: EM | Admit: 2019-12-17 | Discharge: 2019-12-17 | Disposition: A | Discharge status: Home / Self Care | Payer: 59 | Attending: Emergency Medicine | Admitting: Emergency Medicine

## 2019-12-17 ENCOUNTER — Encounter (HOSPITAL_BASED_OUTPATIENT_CLINIC_OR_DEPARTMENT_OTHER): Payer: Self-pay | Admitting: Emergency Medicine

## 2019-12-17 DIAGNOSIS — Z7982 Long term (current) use of aspirin: Secondary | ICD-10-CM | POA: Insufficient documentation

## 2019-12-17 DIAGNOSIS — S81811A Laceration without foreign body, right lower leg, initial encounter: Secondary | ICD-10-CM | POA: Diagnosis not present

## 2019-12-17 DIAGNOSIS — W208XXA Other cause of strike by thrown, projected or falling object, initial encounter: Secondary | ICD-10-CM | POA: Insufficient documentation

## 2019-12-17 DIAGNOSIS — Z23 Encounter for immunization: Secondary | ICD-10-CM | POA: Insufficient documentation

## 2019-12-17 DIAGNOSIS — Z79899 Other long term (current) drug therapy: Secondary | ICD-10-CM | POA: Insufficient documentation

## 2019-12-17 MED ORDER — LIDOCAINE-EPINEPHRINE 2 %-1:100000 IJ SOLN: 20.0000 mL | Freq: Once | INTRAMUSCULAR | Status: DC

## 2019-12-17 MED ORDER — LIDOCAINE-EPINEPHRINE (PF) 2 %-1:200000 IJ SOLN
INTRAMUSCULAR | Status: AC
  Administered 2019-12-17: 20 mL
  Filled 2019-12-17: qty 20

## 2019-12-17 MED ORDER — TETANUS-DIPHTH-ACELL PERTUSSIS 5-2.5-18.5 LF-MCG/0.5 IM SUSY
0.5000 mL | PREFILLED_SYRINGE | Freq: Once | INTRAMUSCULAR | Status: AC
  Administered 2019-12-17: 0.5 mL via INTRAMUSCULAR
  Filled 2019-12-17: qty 0.5

## 2019-12-17 NOTE — ED Provider Notes (Signed)
[facility] EMERGENCY DEPARTMENT Provider Note   CSN: [id number] Arrival date & time: 12/17/19  1553     History Chief Complaint  Patient presents with  . Extremity Laceration    Eric Crane is a 64 y.o. male history depression who presented with laceration.  Patient states that he was cutting down a tree and the tree fell onto his right leg and he has a laceration of the right lower leg. Denies any other injuries.  He is unclear when his last tetanus shot was.  The history is provided by the patient.       Past Medical History:  Diagnosis Date  . Depression     There are no problems to display for this patient.   Past Surgical History:  Procedure Laterality Date  . COLONOSCOPY WITH PROPOFOL N/A 02/18/2016   Procedure: COLONOSCOPY WITH PROPOFOL;  Surgeon: Charolett Bumpers, MD;  Location: [facility];  Service: Endoscopy;  Laterality: N/A;       No family history on file.  Social History   Tobacco Use  . Smoking status: Never Smoker  . Smokeless tobacco: Never Used  Substance Use Topics  . Alcohol use: No  . Drug use: No    Home Medications Prior to Admission medications   Medication Sig Start Date End Date Taking? Authorizing Provider  aspirin 81 MG tablet Take 81 mg by mouth daily.    [provider]  b complex vitamins tablet Take 1 tablet by mouth daily.    [provider]  Boswellia-Glucosamine-Vit D (GLUCOSAMINE COMPLEX PO) Take 1 tablet by mouth daily.    [provider]  CALCIUM PO Take 1 tablet by mouth daily.    [provider]  cholecalciferol (VITAMIN D) 1000 units tablet Take 1,000 Units by mouth daily.    [provider]  Coenzyme Q10 (CO Q 10) 100 MG CAPS Take 100 mg by mouth daily.     [provider]  DHEA 50 MG CAPS Take 100 mg by mouth daily.     [provider]  fish oil-omega-3 fatty acids 1000 MG capsule Take 1 g by mouth daily.    [provider]   Flaxseed, Linseed, (FLAXSEED OIL) 1000 MG CAPS Take 1,000 mg by mouth daily.    [provider]  ibuprofen (ADVIL) 800 MG tablet Take 1 tablet (800 mg total) by mouth 2 (two) times daily as needed. 02/24/19   Adonis Huguenin, NP  lamoTRIgine (LAMICTAL) 200 MG tablet Take 400 mg by mouth daily.    [provider]  Magnesium 100 MG CAPS Take 1 capsule by mouth daily.    [provider]  Multiple Vitamins-Minerals (MULTIVITAMIN WITH MINERALS) tablet Take 1 tablet by mouth daily.    [provider]  niacin 250 MG tablet Take 250 mg by mouth at bedtime.    [provider]  predniSONE (DELTASONE) 10 MG tablet Take 2 tablets (20 mg total) by mouth daily with breakfast. 09/19/19   Persons, [place], PA  QUEtiapine (SEROQUEL XR) 400 MG 24 hr tablet Take 800 mg by mouth at bedtime.    [provider]  selenium 50 MCG TABS tablet Take 50 mcg by mouth daily.    [provider]  vitamin E 400 UNIT capsule Take 400 Units by mouth daily.    [provider]  Zinc 50 MG CAPS Take 1 capsule by mouth daily.    [provider]    Allergies  Patient has no known allergies.  Review of Systems   Review of Systems  Skin: Positive for wound.  All other systems reviewed and are negative.   Physical Exam Updated Vital Signs BP (!) 152/91 (BP Location: Right Arm)   Pulse 70   Temp 98.4 F (36.9 C) (Oral)   Resp 18   Ht 5\' 8"  (1.727 m)   Wt 79.4 kg   SpO2 97%   BMI 26.61 kg/m   Physical Exam Vitals and nursing note reviewed.  HENT:     Head: Normocephalic.     Nose: Nose normal.     Mouth/Throat:     Mouth: Mucous membranes are moist.  Eyes:     Extraocular Movements: Extraocular movements intact.     Pupils: Pupils are equal, round, and reactive to light.  Cardiovascular:     Rate and Rhythm: Normal rate and regular rhythm.     Pulses: Normal pulses.     Heart sounds: Normal heart sounds.  Pulmonary:     Effort:  Pulmonary effort is normal.     Breath sounds: Normal breath sounds.  Abdominal:     General: Abdomen is flat.     Palpations: Abdomen is soft.  Musculoskeletal:        General: Normal range of motion.     Cervical back: Normal range of motion.  Skin:    Capillary Refill: Capillary refill takes less than 2 seconds.     Comments: R lower leg 8 cm laceration with some subcutaneous tissue exposed, missing part of the skin on the lateral aspect.  Patient is able to wiggle his toes and has 2+ DP pulses.  There is no bony tenderness he is able to bear weight on the right leg.  Neurological:     General: No focal deficit present.     Mental Status: He is alert and oriented to person, place, and time.  Psychiatric:        Mood and Affect: Mood normal.        Behavior: Behavior normal.     ED Results / Procedures / Treatments   Labs (all labs ordered are listed, but only abnormal results are displayed) Labs Reviewed - No data to display  EKG None  Radiology No results found.  Procedures Procedures (including critical care time)  LACERATION REPAIR Performed by: Authorized by: Richardean Canal Consent: Verbal consent obtained. Risks and benefits: risks, benefits and alternatives were discussed Consent given by: patient Patient identity confirmed: provided demographic data Prepped and Draped in normal sterile fashion Wound explored  Laceration Location: R lower leg   Laceration Length: 8 cm  No Foreign Bodies seen or palpated  Anesthesia: local infiltration  Local anesthetic: lidocaine 2 % with epinephrine  Anesthetic total: 10 ml  Irrigation method: syringe Amount of cleaning: standard  Skin closure: multi layer  Number of sutures: 4 4-0 subcutaneous and 8 4-0 ethilon simple interrupted   Technique: multi layer as above   Patient tolerance: Patient tolerated the procedure well with no immediate complications.   Medications Ordered in ED Medications   lidocaine-EPINEPHrine (XYLOCAINE W/EPI) 2 %-1:100000 (with pres) injection 20 mL (has no administration in time range)  lidocaine-EPINEPHrine (XYLOCAINE W/EPI) 2 %-1:200000 (PF) injection (has no administration in time range)  Tdap (BOOSTRIX) injection 0.5 mL (has no administration in time range)    ED Course  I have reviewed the triage vital signs and the nursing notes.  Pertinent labs & imaging results that  were available during my care of the patient were reviewed by me and considered in my medical decision making (see chart for details).    MDM Rules/Calculators/A&P                         Eric Crane is a 64 y.o. male presenting with right leg laceration.  I was able to irrigate the wound extensively.  There is no foreign body found.  I was able to suture the laceration.  Patient is missing a small piece of skin on the lateral aspect.  Told him that there may be a scar.  Patient can return in a week for suture removal.   Final Clinical Impression(s) / ED Diagnoses Final diagnoses:  Laceration of right lower extremity, initial encounter    Rx / DC Orders ED Discharge Orders    None       Charlynne Pander, MD 12/17/19 1652

## 2019-12-17 NOTE — Discharge Instructions (Addendum)
Take tylenol, motrin for pain  Suture removal in a week   See your doctor   Return to ER if you have uncontrolled bleeding, worse redness, severe pain

## 2019-12-17 NOTE — ED Triage Notes (Signed)
Reports while cutting a tree with a chain saw the tree fell onto his right leg causing a laceration.  Wrapped in towel at triage.

## 2019-12-17 NOTE — ED Notes (Signed)
Pt discharged to home. Discharge instructions have been discussed with patient and/or family members. Pt verbally acknowledges understanding d/c instructions, and endorses comprehension to checkout at registration before leaving.  °

## 2019-12-17 NOTE — ED Notes (Signed)
Pt sts the tree he was cutting down slid into his RLE

## 2020-01-18 ENCOUNTER — Telehealth: Payer: Self-pay | Admitting: Orthopedic Surgery

## 2020-01-18 NOTE — Telephone Encounter (Signed)
Can you advise or hold for [place]?

## 2020-01-18 NOTE — Telephone Encounter (Signed)
Pt called wondering if [place] could get him refill on steroids because he left his job and no longer has insurance. If so he would like you to fill it [facility] [address] [place].

## 2020-01-18 NOTE — Telephone Encounter (Signed)
Hold for MA.

## 2020-01-22 NOTE — Telephone Encounter (Signed)
No as he has not been seen since September

## 2020-01-22 NOTE — Telephone Encounter (Signed)
I called patient and advised. He thinks that he will have insurance beginning in January.  He will call back to schedule appointment when insurance has gone into effect.

## 2020-01-22 NOTE — Telephone Encounter (Signed)
Please advise 

## 2020-02-01 ENCOUNTER — Other Ambulatory Visit: Payer: Self-pay | Admitting: Physician Assistant

## 2020-02-01 ENCOUNTER — Other Ambulatory Visit: Payer: Self-pay

## 2020-02-01 ENCOUNTER — Encounter: Payer: Self-pay | Admitting: Orthopedic Surgery

## 2020-02-01 ENCOUNTER — Ambulatory Visit: Payer: 59 | Admitting: Physician Assistant

## 2020-02-01 DIAGNOSIS — M48061 Spinal stenosis, lumbar region without neurogenic claudication: Secondary | ICD-10-CM

## 2020-02-01 MED ORDER — PREDNISONE 10 MG PO TABS: 20.0000 mg | ORAL_TABLET | Freq: Every day | ORAL | 1 refills | Status: DC

## 2020-02-01 MED ORDER — IBUPROFEN 800 MG PO TABS: 800.0000 mg | ORAL_TABLET | Freq: Two times a day (BID) | ORAL | 0 refills | Status: DC | PRN

## 2020-02-01 NOTE — Progress Notes (Signed)
   Office Visit Note   Patient: Eric Crane           Date of Birth: 03-Nov-1955           MRN: [id number] Visit Date: 02/01/2020              Requested by: Kirby Funk, MD [address] [place],  [place] [address] PCP: Kirby Funk, MD  Chief Complaint  Patient presents with  . Right Leg - Pain  . Lower Back - Pain      HPI: This is a pleasant 65 year old gentleman with a history of lumbar stenosis.  He had a epidural steroid injection with Dr. Alvester Morin approximately 3 months ago.  He feels he did better with intermittent steroids and is wondering if he could have a refill of the steroids as he has had a recent flare.  Assessment & Plan: Visit Diagnoses: No diagnosis found.  Plan: Patient was given a prescription for his prednisone.  He was told not to take this at the same time as the ibuprofen which he also requested a refill of for his knee.  He understands the risks of GI complications including ulcers.  Follow-up as needed  Follow-Up Instructions: No follow-ups on file.   Ortho Exam  Patient is alert, oriented, no adenopathy, well-dressed, normal affect, normal respiratory effort. Focused examination demonstrates radiating pain down his buttock into his right leg.  He does not have any plantar flexion strength deficits.  No palpable abnormalities  Imaging: No results found. No images are attached to the encounter.  Labs: No results found for: HGBA1C, ESRSEDRATE, CRP, LABURIC, REPTSTATUS, GRAMSTAIN, CULT, LABORGA   No results found for: ALBUMIN, PREALBUMIN, LABURIC  No results found for: MG No results found for: VD25OH  No results found for: PREALBUMIN No flowsheet data found.   There is no height or weight on file to calculate BMI.  Orders:  No orders of the defined types were placed in this encounter.  Meds ordered this encounter  Medications  . ibuprofen (ADVIL) 800 MG tablet    Sig: Take 1 tablet (800 mg total) by mouth 2 (two)  times daily as needed.    Dispense:  180 tablet    Refill:  0  . predniSONE (DELTASONE) 10 MG tablet    Sig: Take 2 tablets (20 mg total) by mouth daily with breakfast.    Dispense:  50 tablet    Refill:  1     Procedures: No procedures performed  Clinical Data: No additional findings.  ROS:  All other systems negative, except as noted in the HPI. Review of Systems  Objective: Vital Signs: There were no vitals taken for this visit.  Specialty Comments:  No specialty comments available.  PMFS History: There are no problems to display for this patient.  Past Medical History:  Diagnosis Date  . Depression     No family history on file.  Past Surgical History:  Procedure Laterality Date  . COLONOSCOPY WITH PROPOFOL N/A 02/18/2016   Procedure: COLONOSCOPY WITH PROPOFOL;  Surgeon: Charolett Bumpers, MD;  Location: [facility];  Service: Endoscopy;  Laterality: N/A;   Social History   Occupational History  . Not on file  Tobacco Use  . Smoking status: Never Smoker  . Smokeless tobacco: Never Used  Substance and Sexual Activity  . Alcohol use: No  . Drug use: No  . Sexual activity: Not on file

## 2020-04-03 ENCOUNTER — Other Ambulatory Visit: Payer: Self-pay | Admitting: Family

## 2020-04-03 MED ORDER — IBUPROFEN 800 MG PO TABS: 800.0000 mg | ORAL_TABLET | Freq: Two times a day (BID) | ORAL | 0 refills | Status: DC | PRN

## 2020-06-17 ENCOUNTER — Other Ambulatory Visit: Payer: Self-pay | Admitting: Physician Assistant

## 2020-06-17 ENCOUNTER — Telehealth: Payer: Self-pay | Admitting: Physician Assistant

## 2020-06-17 MED ORDER — IBUPROFEN 800 MG PO TABS: 800.0000 mg | ORAL_TABLET | Freq: Two times a day (BID) | ORAL | 0 refills | Status: DC | PRN

## 2020-06-17 MED ORDER — PREDNISONE 10 MG PO TABS: 20.0000 mg | ORAL_TABLET | Freq: Every day | ORAL | 1 refills | Status: DC

## 2020-06-17 NOTE — Telephone Encounter (Signed)
Patient called needing Rx refilled   Prednisone and Ibuprofen. The number to contact patient is [phone]

## 2020-06-17 NOTE — Telephone Encounter (Signed)
Last refill 01/2020

## 2020-06-17 NOTE — Telephone Encounter (Signed)
I refilled both but he should not take these at the same time which I told him at his last visit. Will need follow up if he wants prednisone refilled again

## 2020-06-18 NOTE — Telephone Encounter (Signed)
I called patient and advised. 

## 2020-10-02 ENCOUNTER — Other Ambulatory Visit: Payer: Self-pay | Admitting: Physician Assistant

## 2020-10-23 ENCOUNTER — Encounter: Payer: Self-pay | Admitting: Physician Assistant

## 2020-10-23 ENCOUNTER — Ambulatory Visit (INDEPENDENT_AMBULATORY_CARE_PROVIDER_SITE_OTHER): Payer: 59 | Admitting: Physician Assistant

## 2020-10-23 ENCOUNTER — Ambulatory Visit: Payer: Self-pay

## 2020-10-23 DIAGNOSIS — M25561 Pain in right knee: Secondary | ICD-10-CM | POA: Diagnosis not present

## 2020-10-23 MED ORDER — IBUPROFEN 800 MG PO TABS: 800.0000 mg | ORAL_TABLET | Freq: Two times a day (BID) | ORAL | 0 refills | Status: DC | PRN

## 2020-10-23 NOTE — Progress Notes (Signed)
Office Visit Note   Patient: Eric Crane           Date of Birth: [birth year]           MRN: [id number] Visit Date: 10/23/2020              Requested by: Kirby Funk, MD [address] [place],  [place] [address] PCP: Kirby Funk, MD  No chief complaint on file.     HPI: Patient is a pleasant 65 year old gentleman with a history of right knee varus arthritis.  He has been taking ibuprofen 800 mg usually daily or every other day as needed.  He recently noticed a painless clicking around the outside of his knee And was concerned and thought he should get it checked out.  This is not really painful at all.  Assessment & Plan: Visit Diagnoses:  1. Right knee pain, unspecified chronicity     Plan: Discussed with the patient close chain quadricep strengthening exercises.  He should do 33 times a day this was demonstrated.  I did refill his ibuprofen.  I did have a conversation with him that he needs to establish with a primary care provider as he has not seen one in 2 years and if he is on chronic anti-inflammatories he needs to have his renal function followed.  I will give him 1 more refill.  He is going to call his insurance has not recently changed and establish with a primary care  Follow-Up Instructions: No follow-ups on file.   Ortho Exam  Patient is alert, oriented, no adenopathy, well-dressed, normal affect, normal respiratory effort. Right knee no effusion no redness no cellulitis.  He has clinical varus malalignment.  No tenderness over the medial lateral joint line.  He does have some painless clicking along the lateral patella border.  Overall good stability  Imaging: No results found. No images are attached to the encounter.  Labs: No results found for: HGBA1C, ESRSEDRATE, CRP, LABURIC, REPTSTATUS, GRAMSTAIN, CULT, LABORGA   No results found for: ALBUMIN, PREALBUMIN, CBC  No results found for: MG No results found for: VD25OH  No results  found for: PREALBUMIN No flowsheet data found.   There is no height or weight on file to calculate BMI.  Orders:  Orders Placed This Encounter  Procedures   XR Knee 1-2 Views Right   Meds ordered this encounter  Medications   ibuprofen (ADVIL) 800 MG tablet    Sig: Take 1 tablet (800 mg total) by mouth 2 (two) times daily as needed.    Dispense:  180 tablet    Refill:  0     Procedures: No procedures performed  Clinical Data: No additional findings.  ROS:  All other systems negative, except as noted in the HPI. Review of Systems  Objective: Vital Signs: There were no vitals taken for this visit.  Specialty Comments:  No specialty comments available.  PMFS History: There are no problems to display for this patient.  Past Medical History:  Diagnosis Date   Depression     No family history on file.  Past Surgical History:  Procedure Laterality Date   COLONOSCOPY WITH PROPOFOL N/A 02/18/2016   Procedure: COLONOSCOPY WITH PROPOFOL;  Surgeon: Charolett Bumpers, MD;  Location: [facility];  Service: Endoscopy;  Laterality: N/A;   Social History   Occupational History   Not on file  Tobacco Use   Smoking status: Never   Smokeless tobacco: Never  Substance and Sexual Activity  Alcohol use: No   Drug use: No   Sexual activity: Not on file

## 2021-03-10 ENCOUNTER — Ambulatory Visit (INDEPENDENT_AMBULATORY_CARE_PROVIDER_SITE_OTHER): Payer: 59 | Admitting: Registered Nurse

## 2021-03-10 ENCOUNTER — Encounter: Payer: Self-pay | Admitting: Registered Nurse

## 2021-03-10 VITALS — BP 143/68 | HR 66 | Temp 98.3°F | Resp 16 | Ht 67.0 in | Wt 185.6 lb

## 2021-03-10 DIAGNOSIS — Z13228 Encounter for screening for other metabolic disorders: Secondary | ICD-10-CM

## 2021-03-10 DIAGNOSIS — Z13220 Encounter for screening for lipoid disorders: Secondary | ICD-10-CM

## 2021-03-10 DIAGNOSIS — Z130 Encounter for screening for diseases of the blood and blood-forming organs and certain disorders involving the immune mechanism: Secondary | ICD-10-CM

## 2021-03-10 DIAGNOSIS — Z0000 Encounter for general adult medical examination without abnormal findings: Secondary | ICD-10-CM | POA: Diagnosis not present

## 2021-03-10 DIAGNOSIS — R062 Wheezing: Secondary | ICD-10-CM

## 2021-03-10 DIAGNOSIS — Z1329 Encounter for screening for other suspected endocrine disorder: Secondary | ICD-10-CM

## 2021-03-10 DIAGNOSIS — M545 Low back pain, unspecified: Secondary | ICD-10-CM | POA: Diagnosis not present

## 2021-03-10 DIAGNOSIS — G8929 Other chronic pain: Secondary | ICD-10-CM

## 2021-03-10 MED ORDER — ALBUTEROL SULFATE HFA 108 (90 BASE) MCG/ACT IN AERS: 2.0000 | INHALATION_SPRAY | Freq: Four times a day (QID) | RESPIRATORY_TRACT | 2 refills | Status: DC | PRN

## 2021-03-10 MED ORDER — IBUPROFEN 800 MG PO TABS: 800.0000 mg | ORAL_TABLET | Freq: Two times a day (BID) | ORAL | 0 refills | Status: DC | PRN

## 2021-03-10 NOTE — Patient Instructions (Addendum)
[name] -  [name] to meet you  Your exam is normal  Let's check labs  See you annually, sooner with concerns  Thank you,  Eric Crane     If you have lab work done today you will be contacted with your lab results within the next 2 weeks.  If you have not heard from [place] then please contact us. The fastest way to get your results is to register for My Chart.   IF you received an x-ray today, you will receive an invoice from [facility]. Please contact [facility] at [phone] with questions or concerns regarding your invoice.   IF you received labwork today, you will receive an invoice from [place]. Please contact [facility] at [phone] with questions or concerns regarding your invoice.   Our billing staff will not be able to assist you with questions regarding bills from these companies.  You will be contacted with the lab results as soon as they are available. The fastest way to get your results is to activate your My Chart account. Instructions are located on the last page of this paperwork. If you have not heard from [place] regarding the results in 2 weeks, please contact this office.

## 2021-03-10 NOTE — Progress Notes (Signed)
New Patient Office Visit  Subjective:  Patient ID: Eric Crane, male    DOB: April 04, 1955  Age: 66 y.o. MRN: [id number]  CC:  Chief Complaint  Patient presents with   New Patient (Initial Visit)    Patient states he is here to establish care and CPE.    HPI Eric Crane presents for visit to est care and CPE  No acute concerns.   Needs refill on ibuprofen, uses PRN for pain.  Needs refill on albuterol. Wheezes with allergens. Uses sparingly.  Very active, plays hockey 2-3 times a week Works part time at [employer]  Hx of depression Managed by Dr. Evelene Crane. Stable. No concerns.   Histories reviewed and updated with patient.   Past Medical History:  Diagnosis Date   Depression     Past Surgical History:  Procedure Laterality Date   COLONOSCOPY WITH PROPOFOL N/A 02/18/2016   Procedure: COLONOSCOPY WITH PROPOFOL;  Surgeon: Eric Bumpers, MD;  Location: [facility];  Service: Endoscopy;  Laterality: N/A;    Family History  Problem Relation Age of Onset   Heart disease Mother     Social History   Socioeconomic History   Marital status: Divorced    Spouse name: Not on file   Number of children: 0   Years of education: Not on file   Highest education level: Not on file  Occupational History   Occupation: Cytogeneticist  Tobacco Use   Smoking status: Never   Smokeless tobacco: Never  Vaping Use   Vaping Use: Never used  Substance and Sexual Activity   Alcohol use: No   Drug use: No   Sexual activity: Not on file  Other Topics Concern   Not on file  Social History Narrative   Not on file   Social Determinants of Health   Financial Resource Strain: Not on file  Food Insecurity: Not on file  Transportation Needs: Not on file  Physical Activity: Not on file  Stress: Not on file  Social Connections: Not on file  Intimate Partner Violence: Not on file    ROS Review of Systems  Constitutional: Negative.   HENT: Negative.     Eyes: Negative.   Respiratory: Negative.    Cardiovascular: Negative.   Gastrointestinal: Negative.   Genitourinary: Negative.   Musculoskeletal: Negative.   Skin: Negative.   Neurological: Negative.   Psychiatric/Behavioral: Negative.    All other systems reviewed and are negative.  Objective:   Today's Vitals: BP (!) 143/68    Pulse 66    Temp 98.3 F (36.8 C) (Temporal)    Resp 16    Ht 5\' 7"  (1.702 m)    Wt 185 lb 9.6 oz (84.2 kg)    SpO2 97%    BMI 29.07 kg/m   Physical Exam Vitals and nursing note reviewed.  Constitutional:      General: He is not in acute distress.    Appearance: Normal appearance. He is normal weight. He is not ill-appearing, toxic-appearing or diaphoretic.  HENT:     Head: Normocephalic and atraumatic.     Right Ear: Tympanic membrane, ear canal and external ear normal. There is no impacted cerumen.     Left Ear: Tympanic membrane, ear canal and external ear normal. There is no impacted cerumen.     Nose: Nose normal. No congestion or rhinorrhea.     Mouth/Throat:     Mouth: Mucous membranes are moist.     Pharynx: Oropharynx is clear. No oropharyngeal exudate  or posterior oropharyngeal erythema.  Eyes:     General: No scleral icterus.       Right eye: No discharge.        Left eye: No discharge.     Extraocular Movements: Extraocular movements intact.     Conjunctiva/sclera: Conjunctivae normal.     Pupils: Pupils are equal, round, and reactive to light.  Neck:     Vascular: No carotid bruit.  Cardiovascular:     Rate and Rhythm: Normal rate and regular rhythm.     Pulses: Normal pulses.     Heart sounds: Normal heart sounds. No murmur heard.   No friction rub. No gallop.  Pulmonary:     Effort: Pulmonary effort is normal. No respiratory distress.     Breath sounds: Normal breath sounds. No stridor. No wheezing, rhonchi or rales.  Chest:     Chest wall: No tenderness.  Abdominal:     General: Abdomen is flat. Bowel sounds are normal.  There is no distension.     Palpations: Abdomen is soft. There is no mass.     Tenderness: There is no abdominal tenderness. There is no right CVA tenderness, left CVA tenderness, guarding or rebound.     Hernia: No hernia is present.  Musculoskeletal:        General: No swelling, tenderness, deformity or signs of injury. Normal range of motion.     Cervical back: Normal range of motion and neck supple. No rigidity or tenderness.     Right lower leg: No edema.     Left lower leg: No edema.  Lymphadenopathy:     Cervical: No cervical adenopathy.  Skin:    General: Skin is warm and dry.     Capillary Refill: Capillary refill takes less than 2 seconds.     Coloration: Skin is not jaundiced or pale.     Findings: No bruising, erythema, lesion or rash.  Neurological:     General: No focal deficit present.     Mental Status: He is alert and oriented to person, place, and time. Mental status is at baseline.     Cranial Nerves: No cranial nerve deficit.     Sensory: No sensory deficit.     Motor: No weakness.     Coordination: Coordination normal.     Gait: Gait normal.     Deep Tendon Reflexes: Reflexes normal.  Psychiatric:        Mood and Affect: Mood normal.        Behavior: Behavior normal.        Thought Content: Thought content normal.        Judgment: Judgment normal.    Assessment & Plan:   Problem List Items Addressed This Visit   None Visit Diagnoses     Chronic bilateral low back pain without sciatica    -  Primary   Relevant Medications   ibuprofen (ADVIL) 800 MG tablet   Wheezing       Relevant Medications   albuterol (VENTOLIN HFA) 108 (90 Base) MCG/ACT inhaler   Screening for endocrine, metabolic and immunity disorder       Relevant Orders   CBC with Differential/Platelet   Comprehensive metabolic panel   Hemoglobin A1c   TSH   Annual physical exam       Lipid screening       Relevant Orders   Lipid panel       Outpatient Encounter Medications as of  03/10/2021  Medication Sig  albuterol (VENTOLIN HFA) 108 (90 Base) MCG/ACT inhaler Inhale 2 puffs into the lungs every 6 (six) hours as needed for wheezing or shortness of breath.   aspirin 81 MG tablet Take 81 mg by mouth daily.   b complex vitamins tablet Take 1 tablet by mouth daily.   Boswellia-Glucosamine-Vit D (GLUCOSAMINE COMPLEX PO) Take 1 tablet by mouth daily.   CALCIUM PO Take 1 tablet by mouth daily.   cholecalciferol (VITAMIN D) 1000 units tablet Take 1,000 Units by mouth daily.   Coenzyme Q10 (CO Q 10) 100 MG CAPS Take 100 mg by mouth daily.    DHEA 50 MG CAPS Take 100 mg by mouth daily.    fish oil-omega-3 fatty acids 1000 MG capsule Take 1 g by mouth daily.   Flaxseed, Linseed, (FLAXSEED OIL) 1000 MG CAPS Take 1,000 mg by mouth daily.   lamoTRIgine (LAMICTAL) 200 MG tablet Take 400 mg by mouth daily.   Magnesium 100 MG CAPS Take 1 capsule by mouth daily.   Multiple Vitamins-Minerals (MULTIVITAMIN WITH MINERALS) tablet Take 1 tablet by mouth daily.   niacin 250 MG tablet Take 250 mg by mouth at bedtime.   predniSONE (DELTASONE) 10 MG tablet Take 2 tablets (20 mg total) by mouth daily with breakfast.   QUEtiapine (SEROQUEL XR) 400 MG 24 hr tablet Take 800 mg by mouth at bedtime.   selenium 50 MCG TABS tablet Take 50 mcg by mouth daily.   vitamin E 400 UNIT capsule Take 400 Units by mouth daily.   Zinc 50 MG CAPS Take 1 capsule by mouth daily.   [DISCONTINUED] ibuprofen (ADVIL) 800 MG tablet Take 1 tablet (800 mg total) by mouth 2 (two) times daily as needed.   ibuprofen (ADVIL) 800 MG tablet Take 1 tablet (800 mg total) by mouth 2 (two) times daily as needed.   No facility-administered encounter medications on file as of 03/10/2021.    Follow-up: Return in about 1 year (around 03/10/2022) for CPE. Needs lab only within 3 weeks.   PLAN Exam unremarkable Labs collected. Will follow up with the patient as warranted. Return annually Patient encouraged to call clinic with any  questions, comments, or concerns.  Janeece Agee, NP

## 2021-03-11 ENCOUNTER — Other Ambulatory Visit (INDEPENDENT_AMBULATORY_CARE_PROVIDER_SITE_OTHER): Payer: 59

## 2021-03-11 DIAGNOSIS — Z13228 Encounter for screening for other metabolic disorders: Secondary | ICD-10-CM

## 2021-03-11 DIAGNOSIS — Z1329 Encounter for screening for other suspected endocrine disorder: Secondary | ICD-10-CM

## 2021-03-11 DIAGNOSIS — Z130 Encounter for screening for diseases of the blood and blood-forming organs and certain disorders involving the immune mechanism: Secondary | ICD-10-CM

## 2021-03-11 DIAGNOSIS — Z13220 Encounter for screening for lipoid disorders: Secondary | ICD-10-CM

## 2021-03-11 LAB — CBC WITH DIFFERENTIAL/PLATELET
Basophils Absolute: 0.1 10*3/uL (ref 0.0–0.1)
Basophils Relative: 1.2 % (ref 0.0–3.0)
Eosinophils Absolute: 0.3 10*3/uL (ref 0.0–0.7)
Eosinophils Relative: 6.5 % — ABNORMAL HIGH (ref 0.0–5.0)
HCT: 44.5 % (ref 39.0–52.0)
Hemoglobin: 14.7 g/dL (ref 13.0–17.0)
Lymphocytes Relative: 32 % (ref 12.0–46.0)
Lymphs Abs: 1.7 10*3/uL (ref 0.7–4.0)
MCHC: 33.1 g/dL (ref 30.0–36.0)
MCV: 87 fl (ref 78.0–100.0)
Monocytes Absolute: 0.7 10*3/uL (ref 0.1–1.0)
Monocytes Relative: 13.2 % — ABNORMAL HIGH (ref 3.0–12.0)
Neutro Abs: 2.5 10*3/uL (ref 1.4–7.7)
Neutrophils Relative %: 47.1 % (ref 43.0–77.0)
Platelets: 142 10*3/uL — ABNORMAL LOW (ref 150.0–400.0)
RBC: 5.12 Mil/uL (ref 4.22–5.81)
RDW: 13.5 % (ref 11.5–15.5)
WBC: 5.2 10*3/uL (ref 4.0–10.5)

## 2021-03-11 LAB — TSH: TSH: 1.02 u[IU]/mL (ref 0.35–5.50)

## 2021-03-11 LAB — HEMOGLOBIN A1C: Hgb A1c MFr Bld: 6.3 % (ref 4.6–6.5)

## 2021-03-12 ENCOUNTER — Encounter: Payer: Self-pay | Admitting: Registered Nurse

## 2021-03-12 LAB — LIPID PANEL
Cholesterol: 248 mg/dL — ABNORMAL HIGH (ref <200)
HDL: 44 mg/dL (ref >=40)
LDL Cholesterol (Calc): 175 mg/dL (calc) — ABNORMAL HIGH
Non-HDL Cholesterol (Calc): 204 mg/dL (calc) — ABNORMAL HIGH (ref <130)
Total CHOL/HDL Ratio: 5.6 (calc) — ABNORMAL HIGH (ref <5.0)
Triglycerides: 145 mg/dL (ref <150)

## 2021-03-12 LAB — COMPREHENSIVE METABOLIC PANEL
AG Ratio: 1.7 (calc) (ref 1.0–2.5)
ALT: 18 U/L (ref 9–46)
AST: 17 U/L (ref 10–35)
Albumin: 4.1 g/dL (ref 3.6–5.1)
Alkaline phosphatase (APISO): 101 U/L (ref 35–144)
BUN/Creatinine Ratio: 22 (calc) (ref 6–22)
BUN: 28 mg/dL — ABNORMAL HIGH (ref 7–25)
CO2: 21 mmol/L (ref 20–32)
Calcium: 9 mg/dL (ref 8.6–10.3)
Chloride: 106 mmol/L (ref 98–110)
Creat: 1.3 mg/dL (ref 0.70–1.35)
Globulin: 2.4 g/dL (calc) (ref 1.9–3.7)
Glucose, Bld: 89 mg/dL (ref 65–99)
Potassium: 4.1 mmol/L (ref 3.5–5.3)
Sodium: 140 mmol/L (ref 135–146)
Total Bilirubin: 0.4 mg/dL (ref 0.2–1.2)
Total Protein: 6.5 g/dL (ref 6.1–8.1)

## 2021-04-03 ENCOUNTER — Telehealth: Payer: Self-pay

## 2021-04-03 NOTE — Telephone Encounter (Signed)
Caller name:Eric Crane  ? ?On DPR? :No ? ?Call back number:[phone] ? ?Provider they see: Gerlene Burdock ? ?Reason for call:Pt is calling wanting to know if Gerlene Burdock can prescribe pt Adderall pt was seeing Dr Evelene Croon and Karin Golden will not take this doctors anymore pt take 20mg  90 tablets a month?  ? ?

## 2021-04-04 NOTE — Telephone Encounter (Signed)
We would need an appointment to discuss new medication  ?

## 2021-04-04 NOTE — Telephone Encounter (Signed)
LM asking pt to call back to schedule an appt  

## 2021-04-07 ENCOUNTER — Ambulatory Visit (INDEPENDENT_AMBULATORY_CARE_PROVIDER_SITE_OTHER): Payer: 59 | Admitting: Physician Assistant

## 2021-04-07 ENCOUNTER — Encounter: Payer: Self-pay | Admitting: Physician Assistant

## 2021-04-07 ENCOUNTER — Encounter: Payer: Self-pay | Admitting: Registered Nurse

## 2021-04-07 VITALS — Ht 67.0 in | Wt 185.0 lb

## 2021-04-07 DIAGNOSIS — M1711 Unilateral primary osteoarthritis, right knee: Secondary | ICD-10-CM

## 2021-04-07 DIAGNOSIS — M25561 Pain in right knee: Secondary | ICD-10-CM

## 2021-04-07 MED ORDER — MELOXICAM 15 MG PO TABS: 15.0000 mg | ORAL_TABLET | Freq: Every day | ORAL | 2 refills | Status: DC

## 2021-04-07 MED ORDER — LIDOCAINE HCL 1 % IJ SOLN
5.0000 mL | INTRAMUSCULAR | Status: AC | PRN
  Administered 2021-04-07: 5 mL

## 2021-04-07 MED ORDER — METHYLPREDNISOLONE ACETATE 40 MG/ML IJ SUSP
40.0000 mg | INTRAMUSCULAR | Status: AC | PRN
  Administered 2021-04-07: 40 mg via INTRA_ARTICULAR

## 2021-04-07 NOTE — Progress Notes (Signed)
? ?Office Visit Note ?  ?Patient: Eric Crane           ?Date of Birth: 02/18/1955           ?MRN: [id number] ?Visit Date: 04/07/2021 ?             ?Requested by: Janeece Agee, NP ?[phone] ?[place],  [place] [address] ?PCP: Janeece Agee, NP ? ?Chief Complaint  ?Patient presents with  ? Right Knee - Pain  ? ? ? ? ?HPI: ?Patient is a very pleasant active 66 year old gentleman who has ongoing right varus arthritis in his knee.  I last saw him last year where he was doing exercises and taking ibuprofen.  He still finds his ibuprofen to be helpful.  But he does feel his symptoms are worsening.  He enjoys playing recreational hockey and does not have too much trouble with that but he stands on concrete floors at work and is having increased pain after a short time at his job. ? ?Assessment & Plan: ?Visit Diagnoses: Right varus knee arthritis. ? ?Plan: Had a long discussion with him today.  He does feel like this is becoming a quality-of-life issue.  We will try another steroid injection today and I will also call him in some meloxicam.  He understands not to take the ibuprofen while he is on this medication.  If he gets short-term relief he can call here and we can try and authorize gel injections.  Ultimately he feels like he may want to discuss knee replacement and we briefly discussed it today.  He will have a follow-up appointment with Dr. Roda Shutters in a month if he wishes to discuss this further ? ?Follow-Up Instructions: No follow-ups on file.  ? ?Ortho Exam ? ?Patient is alert, oriented, no adenopathy, well-dressed, normal affect, normal respiratory effort. ?Examination of his right knee he has obvious varus alignment.  He has no effusion no redness.  He is tender globally both with the patellofemoral joint and the medial joint line.  To a lesser degree the lateral joint line.  He does have significant grinding with range of motion.  Previous x-rays in September demonstrate advanced varus arthritis and  patellofemoral arthritis with some chondral sclerosis and osteophyte formation ? ?Imaging: ?No results found. ?No images are attached to the encounter. ? ?Labs: ?Lab Results  ?Component Value Date  ? HGBA1C 6.3 03/11/2021  ? ? ? ?No results found for: ALBUMIN, PREALBUMIN, CBC ? ?No results found for: MG ?No results found for: VD25OH ? ?No results found for: PREALBUMIN ?CBC EXTENDED Latest Ref Rng & Units 03/11/2021  ?WBC 4.0 - 10.5 K/uL 5.2  ?RBC 4.22 - 5.81 Mil/uL 5.12  ?HGB 13.0 - 17.0 g/dL 22.9  ?HCT 39.0 - 52.0 % 44.5  ?PLT 150.0 - 400.0 K/uL 142.0(L)  ?NEUTROABS 1.4 - 7.7 K/uL 2.5  ?LYMPHSABS 0.7 - 4.0 K/uL 1.7  ? ? ? ?Body mass index is 28.98 kg/m?. ? ?Orders:  ?No orders of the defined types were placed in this encounter. ? ?No orders of the defined types were placed in this encounter. ? ? ? Procedures: ?Large Joint Inj: R knee on 04/07/2021 9:35 AM ?Indications: pain and diagnostic evaluation ?Details: 25 G 1.5 in needle, anteromedial approach ? ?Arthrogram: No ? ?Medications: 40 mg methylPREDNISolone acetate 40 MG/ML; 5 mL lidocaine 1 % ?Outcome: tolerated well, no immediate complications ?Procedure, treatment alternatives, risks and benefits explained, specific risks discussed. Consent was given by the patient and spouse. Immediately prior  to procedure a time out was called to verify the correct patient, procedure, equipment, support staff and site/side marked as required.  ? ? ? ?Clinical Data: ?No additional findings. ? ?ROS: ? ?All other systems negative, except as noted in the HPI. ?Review of Systems  ?All other systems reviewed and are negative. ? ?Objective: ?Vital Signs: Ht 5\' 7"  (1.702 m)   Wt 185 lb (83.9 kg)   BMI 28.98 kg/m?  ? ?Specialty Comments:  ?No specialty comments available. ? ?PMFS History: ?There are no problems to display for this patient. ? ?Past Medical History:  ?Diagnosis Date  ? Depression   ?  ?Family History  ?Problem Relation Age of Onset  ? Heart disease Mother   ?  ?Past  Surgical History:  ?Procedure Laterality Date  ? COLONOSCOPY WITH PROPOFOL N/A 02/18/2016  ? Procedure: COLONOSCOPY WITH PROPOFOL;  Surgeon: 02/20/2016, MD;  Location: [facility];  Service: Endoscopy;  Laterality: N/A;  ? ?Social History  ? ?Occupational History  ? Occupation: [employer]  ?Tobacco Use  ? Smoking status: Never  ? Smokeless tobacco: Never  ?Vaping Use  ? Vaping Use: Never used  ?Substance and Sexual Activity  ? Alcohol use: No  ? Drug use: No  ? Sexual activity: Not on file  ? ? ? ? ? ?

## 2021-04-10 ENCOUNTER — Other Ambulatory Visit: Payer: Self-pay | Admitting: Registered Nurse

## 2021-05-13 ENCOUNTER — Ambulatory Visit: Payer: 59 | Admitting: Orthopaedic Surgery

## 2021-05-29 ENCOUNTER — Telehealth: Payer: Self-pay | Admitting: Physician Assistant

## 2021-05-29 NOTE — Telephone Encounter (Signed)
Patient would like another injection in his right knee (preferably) he is not sure if its too early for another one and would like to know what are other options if it is too early. ?

## 2021-06-06 NOTE — Telephone Encounter (Signed)
Noted  

## 2021-06-10 ENCOUNTER — Telehealth: Payer: Self-pay | Admitting: Physician Assistant

## 2021-06-10 NOTE — Telephone Encounter (Signed)
Pt called requesting a call back from April J. For update if left knee gel injection. Pt states PA Persons was to submit to insurance company. Please call pt at [phone]. ?

## 2021-06-11 NOTE — Telephone Encounter (Signed)
Will check status

## 2021-06-12 NOTE — Telephone Encounter (Signed)
Called and left a Vm advising patient that an authorization is required for gel injection and once approved, I will give a call to schedule.

## 2021-07-07 ENCOUNTER — Other Ambulatory Visit: Payer: Self-pay | Admitting: Registered Nurse

## 2021-07-07 DIAGNOSIS — M545 Low back pain, unspecified: Secondary | ICD-10-CM

## 2021-07-07 MED ORDER — IBUPROFEN 800 MG PO TABS: 800.0000 mg | ORAL_TABLET | Freq: Two times a day (BID) | ORAL | 0 refills | Status: DC | PRN

## 2021-07-11 ENCOUNTER — Telehealth: Payer: Self-pay | Admitting: Physician Assistant

## 2021-07-11 NOTE — Telephone Encounter (Signed)
Patient called. Would like [place] to know that his insurance will not cover the gel injections, he can not have his surgery until January. Also would like to know if he could have a cortisone shot? His call back number is [phone]

## 2021-07-11 NOTE — Telephone Encounter (Signed)
Contacted patient and he has been scheduled to have right knee injection

## 2021-07-14 ENCOUNTER — Encounter: Payer: Self-pay | Admitting: Physician Assistant

## 2021-07-14 ENCOUNTER — Ambulatory Visit (INDEPENDENT_AMBULATORY_CARE_PROVIDER_SITE_OTHER): Payer: 59 | Admitting: Physician Assistant

## 2021-07-14 DIAGNOSIS — M1711 Unilateral primary osteoarthritis, right knee: Secondary | ICD-10-CM

## 2021-07-14 MED ORDER — LIDOCAINE HCL 1 % IJ SOLN
4.0000 mL | INTRAMUSCULAR | Status: AC | PRN
  Administered 2021-07-14: 4 mL

## 2021-07-14 MED ORDER — METHYLPREDNISOLONE ACETATE 40 MG/ML IJ SUSP
80.0000 mg | INTRAMUSCULAR | Status: AC | PRN
  Administered 2021-07-14: 80 mg via INTRA_ARTICULAR

## 2021-07-14 NOTE — Progress Notes (Signed)
Office Visit Note   Patient: Eric Crane           Date of Birth: Dec 30, 1955           MRN: [id number] Visit Date: 07/14/2021              Requested by: Janeece Agee, NP [address] [place],  [place] [address] PCP: Janeece Agee, NP  Chief Complaint  Patient presents with  . Right Knee - Follow-up      HPI: Patient is a pleasant 66 year old gentleman with a history of varus arthritis to his right knee.  He does get some relief with cortisone injections.  He has a very high deductible to pay for viscosupplementation.  He would like to go forward with an injection into the right knee today.  No new injury  Assessment & Plan: Visit Diagnoses: Osteoarthritis right knee  Plan: Micah Flesher forward with an injection into his right knee he may follow-up as needed  Follow-Up Instructions: Return if symptoms worsen or fail to improve.   Ortho Exam  Patient is alert, oriented, no adenopathy, well-dressed, normal affect, normal respiratory effort. Patient has varus malalignment with pain over the medial joint line no effusion no redness no cellulitis  Imaging: No results found. No images are attached to the encounter.  Labs: Lab Results  Component Value Date   HGBA1C 6.3 03/11/2021     No results found for: "ALBUMIN", "PREALBUMIN", "CBC"  No results found for: "MG" No results found for: "VD25OH"  No results found for: "PREALBUMIN"    Latest Ref Rng & Units 03/11/2021    8:12 AM  CBC EXTENDED  WBC 4.0 - 10.5 K/uL 5.2   RBC 4.22 - 5.81 Mil/uL 5.12   Hemoglobin 13.0 - 17.0 g/dL 32.4   HCT 40.1 - 02.7 % 44.5   Platelets 150.0 - 400.0 K/uL 142.0   NEUT# 1.4 - 7.7 K/uL 2.5   Lymph# 0.7 - 4.0 K/uL 1.7      There is no height or weight on file to calculate BMI.  Orders:  No orders of the defined types were placed in this encounter.  No orders of the defined types were placed in this encounter.    Procedures: Large Joint Inj: R knee on 07/14/2021 3:41  PM Indications: pain and diagnostic evaluation Details: 25 G 1.5 in needle, anteromedial approach  Arthrogram: No  Medications: 80 mg methylPREDNISolone acetate 40 MG/ML; 4 mL lidocaine 1 % Outcome: tolerated well, no immediate complications Procedure, treatment alternatives, risks and benefits explained, specific risks discussed. Consent was given by the patient.     Clinical Data: No additional findings.  ROS:  All other systems negative, except as noted in the HPI. Review of Systems  Objective: Vital Signs: There were no vitals taken for this visit.  Specialty Comments:  No specialty comments available.  PMFS History: Patient Active Problem List   Diagnosis Date Noted  . Unilateral primary osteoarthritis, right knee 04/07/2021   Past Medical History:  Diagnosis Date  . Depression     Family History  Problem Relation Age of Onset  . Heart disease Mother     Past Surgical History:  Procedure Laterality Date  . COLONOSCOPY WITH PROPOFOL N/A 02/18/2016   Procedure: COLONOSCOPY WITH PROPOFOL;  Surgeon: Charolett Bumpers, MD;  Location: [facility];  Service: Endoscopy;  Laterality: N/A;   Social History   Occupational History  . Occupation: Cytogeneticist  Tobacco Use  . Smoking status: Never  .  Smokeless tobacco: Never  Vaping Use  . Vaping Use: Never used  Substance and Sexual Activity  . Alcohol use: No  . Drug use: No  . Sexual activity: Not on file

## 2021-07-24 ENCOUNTER — Telehealth: Payer: Self-pay | Admitting: Physician Assistant

## 2021-07-24 NOTE — Telephone Encounter (Signed)
Pt called stating PA Chales Abrahams told him to see Dr. Roda Shutters for right knee. I did not see on pt chart that PA Chales Abrahams recommends to see Dr. Roda Shutters. Please call pt if PA Chales Abrahams recommended to see Dr Roda Shutters and set appt. Pt phone number is [phone].

## 2021-07-28 NOTE — Telephone Encounter (Signed)
Patient was last seen on 07/14/2021 and received injection. Did you want patient to see Dr. Roda Shutters for his right knee pain?

## 2021-07-28 NOTE — Telephone Encounter (Signed)
Contacted patient and he has been scheduled to see Dr. Roda Shutters for his right knee pain.

## 2021-08-12 ENCOUNTER — Ambulatory Visit (INDEPENDENT_AMBULATORY_CARE_PROVIDER_SITE_OTHER): Payer: 59

## 2021-08-12 ENCOUNTER — Ambulatory Visit (INDEPENDENT_AMBULATORY_CARE_PROVIDER_SITE_OTHER): Payer: 59 | Admitting: Orthopaedic Surgery

## 2021-08-12 DIAGNOSIS — M1711 Unilateral primary osteoarthritis, right knee: Secondary | ICD-10-CM | POA: Insufficient documentation

## 2021-08-12 NOTE — Progress Notes (Signed)
Office Visit Note   Patient: Eric Crane           Date of Birth: Jul 29, 1955           MRN: [id number] Visit Date: 08/12/2021              Requested by: Janeece Agee, NP [address] [place],  [place] [address] PCP: Janeece Agee, NP   Assessment & Plan: Visit Diagnoses:  1. Primary osteoarthritis of right knee     Plan: Impression is end-stage varus right knee DJD.  He is bone-on-bone in the medial compartment.  At this point he has elected to move forward with a right total knee replacement sometime in early December.  Most recent cortisone injection was 07/14/2021 which lasted a week.  Risk benefits prognosis reviewed with the patient in detail.  I think it is reasonable that he will be able to return back to hockey once he has recovered from the surgery.  Denies history of DVT or nickel allergy.  Follow-Up Instructions: No follow-ups on file.   Orders:  Orders Placed This Encounter  Procedures   XR KNEE 3 VIEW RIGHT   No orders of the defined types were placed in this encounter.     Procedures: No procedures performed   Clinical Data: No additional findings.   Subjective: Chief Complaint  Patient presents with   Right Knee - Pain    HPI Eric Crane is a very pleasant 66 year old gentleman here for chronic severe right knee pain interfering with ADLs and quality of life.  He is managed this for several years with injections and medications and activity modifications.  Works part-time at a [employer].  He is an avid Librarian, academic.  Advil no longer provides any relief.  Most recent cortisone injection in mid June lasted a week.  Review of Systems  Constitutional: Negative.   All other systems reviewed and are negative.    Objective: Vital Signs: There were no vitals taken for this visit.  Physical Exam Vitals and nursing note reviewed.  Constitutional:      Appearance: He is well-developed.  Pulmonary:     Effort: Pulmonary effort  is normal.  Abdominal:     Palpations: Abdomen is soft.  Skin:    General: Skin is warm.  Neurological:     Mental Status: He is alert and oriented to person, place, and time.  Psychiatric:        Behavior: Behavior normal.        Thought Content: Thought content normal.        Judgment: Judgment normal.     Ortho Exam Examination of right knee shows varus deformity.  No joint effusion.  Medial joint line tenderness.  Pain and crepitus throughout range of motion.  Collaterals and cruciates are stable. Specialty Comments:  No specialty comments available.  Imaging: XR KNEE 3 VIEW RIGHT  Result Date: 08/12/2021 Advanced tricompartmental degenerative joint disease.  Bone-on-bone joint space narrowing.  Varus deformity.    PMFS History: Patient Active Problem List   Diagnosis Date Noted   Primary osteoarthritis of right knee 08/12/2021   Unilateral primary osteoarthritis, right knee 04/07/2021   Past Medical History:  Diagnosis Date   Depression     Family History  Problem Relation Age of Onset   Heart disease Mother     Past Surgical History:  Procedure Laterality Date   COLONOSCOPY WITH PROPOFOL N/A 02/18/2016   Procedure: COLONOSCOPY WITH PROPOFOL;  Surgeon:  Charolett Bumpers, MD;  Location: [facility];  Service: Endoscopy;  Laterality: N/A;   Social History   Occupational History   Occupation: Cytogeneticist  Tobacco Use   Smoking status: Never   Smokeless tobacco: Never  Vaping Use   Vaping Use: Never used  Substance and Sexual Activity   Alcohol use: No   Drug use: No   Sexual activity: Not on file

## 2021-09-06 ENCOUNTER — Other Ambulatory Visit: Payer: Self-pay | Admitting: Physician Assistant

## 2021-11-17 ENCOUNTER — Telehealth: Payer: Self-pay | Admitting: Orthopaedic Surgery

## 2021-11-20 ENCOUNTER — Encounter: Payer: Self-pay | Admitting: Internal Medicine

## 2021-11-20 ENCOUNTER — Ambulatory Visit (INDEPENDENT_AMBULATORY_CARE_PROVIDER_SITE_OTHER): Payer: 59 | Admitting: Internal Medicine

## 2021-11-20 VITALS — BP 138/68 | HR 63 | Temp 98.0°F | Ht 68.0 in | Wt 185.8 lb

## 2021-11-20 DIAGNOSIS — M1711 Unilateral primary osteoarthritis, right knee: Secondary | ICD-10-CM | POA: Diagnosis not present

## 2021-11-20 DIAGNOSIS — E785 Hyperlipidemia, unspecified: Secondary | ICD-10-CM | POA: Diagnosis not present

## 2021-11-20 DIAGNOSIS — M479 Spondylosis, unspecified: Secondary | ICD-10-CM | POA: Diagnosis not present

## 2021-11-20 DIAGNOSIS — D696 Thrombocytopenia, unspecified: Secondary | ICD-10-CM | POA: Diagnosis not present

## 2021-11-20 DIAGNOSIS — F909 Attention-deficit hyperactivity disorder, unspecified type: Secondary | ICD-10-CM | POA: Insufficient documentation

## 2021-11-20 DIAGNOSIS — R69 Illness, unspecified: Secondary | ICD-10-CM | POA: Diagnosis not present

## 2021-11-20 DIAGNOSIS — F3181 Bipolar II disorder: Secondary | ICD-10-CM

## 2021-11-20 DIAGNOSIS — E663 Overweight: Secondary | ICD-10-CM

## 2021-11-20 DIAGNOSIS — R638 Other symptoms and signs concerning food and fluid intake: Secondary | ICD-10-CM | POA: Insufficient documentation

## 2021-11-20 DIAGNOSIS — Z8249 Family history of ischemic heart disease and other diseases of the circulatory system: Secondary | ICD-10-CM | POA: Insufficient documentation

## 2021-11-20 HISTORY — DX: Family history of ischemic heart disease and other diseases of the circulatory system: Z82.49

## 2021-11-20 HISTORY — DX: Overweight: E66.3

## 2021-11-20 HISTORY — DX: Spondylosis, unspecified: M47.9

## 2021-11-20 HISTORY — DX: Bipolar II disorder: F31.81

## 2021-11-20 HISTORY — DX: Thrombocytopenia, unspecified: D69.6

## 2021-11-20 MED ORDER — ROSUVASTATIN CALCIUM 40 MG PO TABS: 40.0000 mg | ORAL_TABLET | Freq: Every day | ORAL | 3 refills | Status: DC

## 2021-11-20 NOTE — Assessment & Plan Note (Signed)
Stable, not bothering Recommended limit contact sports (ice hockey)

## 2021-11-20 NOTE — Progress Notes (Signed)
Today's healthcare provider: Loralee Pacas, MD  Phone: [phone]  New patient visit  Visit Date: 11/20/2021 Patient: Eric Crane   DOB: [birth year]   66 y.o. Male  MRN: [id number]  Assessment and Plan:    Today's visit is just a transfer of care visit primarily and he does not have any active problems except for he would like to be referred for possible knee replacement.     Demarion was seen today for transitions of care.  Primary osteoarthritis of right knee Assessment & Plan: .   Overweight  FH: heart disease Overview: Aunt died early 27s of some weird heart thing 3 aunts total died in 58s of heart problems Hasn't spoken to father since 38 so doesn't know that history but knows they have high longevity.    Osteoarthritis of lower back Overview: MRI 2021 Rarely ever hurts  T12- L1: Asymmetric leftward disc narrowing and bulging with endplate ridging. Asymmetric left facet spurring. Moderate left foraminal narrowing. Patent spinal canal. Bulky left far-lateral spur deforming the upper psoas.   L1-L2: Asymmetric leftward disc narrowing and endplate ridging with bulging. Moderate left foraminal narrowing. The canal is patent   L2-L3: Asymmetric leftward disc narrowing and bulging with endplate ridging. Mild facet spurring.   L3-L4: Disc collapse and right eccentric bulging/ridging. Degenerative facet spurring asymmetric to the right where there is at least moderate foraminal stenosis. Moderate spinal stenosis. Asymmetric effacement of the right subarticular recess but the descending nerve root is medial and not compressed.   L4-L5: Asymmetric rightward disc collapse and far-lateral ridging. Asymmetric right degenerative facet spurring. Right foraminal impingement. The canal and bilateral subarticular recesses are patent.   L5-S1:Facet osteoarthritis with spurring and anterolisthesis. Bilateral joint effusion is present. The disc is narrowed  and bulging with a right foraminal protrusion superimposed based on sagittal images. Advanced right foraminal impingement with L5 root flattening. Right subarticular recess stenosis without static compression.   IMPRESSION: 1. Generalized degenerative disease with dextroscoliosis. 2. T12-L1 and L1-2 moderate left foraminal stenosis. A bulky left far-lateral osteophyte at T12-L1 displaces the upper psoas and could contact the upper lumbar plexus.   Assessment & Plan: Stable, not bothering Recommended limit contact sports (ice hockey)   Hyperlipidemia, acquired Overview: Lipid Panel     Component Value Date/Time   CHOL 248 (H) 03/11/2021 0812   TRIG 145 03/11/2021 0812   HDL 44 03/11/2021 0812   CHOLHDL 5.6 (H) 03/11/2021 0812   LDLCALC 175 (H) 03/11/2021 0812   The 10-year ASCVD risk score (Arnett DK, et al., 2019) is: 16.8%   Values used to calculate the score:     Age: 58 years     Sex: Male     Is Non-Hispanic African American: No     Diabetic: No     Tobacco smoker: No     Systolic Blood Pressure: 0000000 mmHg     Is BP treated: No     HDL Cholesterol: 44 mg/dL     Total Cholesterol: 248 mg/dL    Thrombocytopenia (HCC) Overview: Lab Results  Component Value Date/Time   PLT 142.0 (L) 03/11/2021 08:12 AM     Attention deficit hyperactivity disorder (ADHD), unspecified ADHD type Overview: Managed by Dr. Robina Ade   Bipolar 2 disorder [facility]) Assessment & Plan: Following with Dr. Robina Ade    Unilateral primary osteoarthritis, right knee Overview: Planned 2024 Replacement in January by Dr. Erlinda Hong      Health Maintenance  Topic Date Due   HIV Screening  Never done  COVID-19 Vaccine (1) 12/06/2021 (Originally 07/23/1956)   Zoster Vaccines- Shingrix (1 of 2) 02/20/2022 (Originally 01/23/2007)   Hepatitis C Screening  03/10/2022 (Originally 01/23/1975)   INFLUENZA VACCINE  04/26/2022 (Originally 08/26/2021)   COLONOSCOPY (Pts 45-51yrs Insurance coverage will need to  be confirmed)  02/17/2026   TETANUS/TDAP  12/16/2029   HPV VACCINES  Aged Out     Recommended follow up: No follow-ups on file.   Subjective:  Patient presents today to establish care.  Prior patient of Orland Mustard.  Chief Complaint  Patient presents with   Transitions Of Care    For history taking, I took a per problem history from the patient and chart review as follows: Problem  Overweight  Fh: Heart Disease   Aunt died early 31s of some weird heart thing 3 aunts total died in 31s of heart problems Hasn't spoken to father since 67 so doesn't know that history but knows they have high longevity.    Osteoarthritis of Lower Back   MRI 2021 Rarely ever hurts  T12- L1: Asymmetric leftward disc narrowing and bulging with endplate ridging. Asymmetric left facet spurring. Moderate left foraminal narrowing. Patent spinal canal. Bulky left far-lateral spur deforming the upper psoas.   L1-L2: Asymmetric leftward disc narrowing and endplate ridging with bulging. Moderate left foraminal narrowing. The canal is patent   L2-L3: Asymmetric leftward disc narrowing and bulging with endplate ridging. Mild facet spurring.   L3-L4: Disc collapse and right eccentric bulging/ridging. Degenerative facet spurring asymmetric to the right where there is at least moderate foraminal stenosis. Moderate spinal stenosis. Asymmetric effacement of the right subarticular recess but the descending nerve root is medial and not compressed.   L4-L5: Asymmetric rightward disc collapse and far-lateral ridging. Asymmetric right degenerative facet spurring. Right foraminal impingement. The canal and bilateral subarticular recesses are patent.   L5-S1:Facet osteoarthritis with spurring and anterolisthesis. Bilateral joint effusion is present. The disc is narrowed and bulging with a right foraminal protrusion superimposed based on sagittal images. Advanced right foraminal impingement with L5 root flattening.  Right subarticular recess stenosis without static compression.   IMPRESSION: 1. Generalized degenerative disease with dextroscoliosis. 2. T12-L1 and L1-2 moderate left foraminal stenosis. A bulky left far-lateral osteophyte at T12-L1 displaces the upper psoas and could contact the upper lumbar plexus.    Hyperlipidemia, Acquired   Lipid Panel     Component Value Date/Time   CHOL 248 (H) 03/11/2021 0812   TRIG 145 03/11/2021 0812   HDL 44 03/11/2021 0812   CHOLHDL 5.6 (H) 03/11/2021 0812   LDLCALC 175 (H) 03/11/2021 0812  The 10-year ASCVD risk score (Arnett DK, et al., 2019) is: 16.8%   Values used to calculate the score:     Age: 89 years     Sex: Male     Is Non-Hispanic African American: No     Diabetic: No     Tobacco smoker: No     Systolic Blood Pressure: 0000000 mmHg     Is BP treated: No     HDL Cholesterol: 44 mg/dL     Total Cholesterol: 248 mg/dL    Thrombocytopenia (Hcc)   Lab Results  Component Value Date/Time   PLT 142.0 (L) 03/11/2021 08:12 AM     Attention Deficit Hyperactivity Disorder (Adhd)   Managed by Dr. Robina Ade   Bipolar 2 Disorder (Hcc)  Unilateral Primary Osteoarthritis, Right Knee   Planned 2024 Replacement in January by Dr. Erlinda Hong   Primary Osteoarthritis of Right Knee (Resolved)  Depression Screen    12-16-21    8:51 AM 03/10/2021    3:26 PM  PHQ 2/9 Scores  PHQ - 2 Score 0 0  PHQ- 9 Score  0   The following were reviewed and entered/updated in epic: Past Medical History:  Diagnosis Date   Bipolar 2 disorder ([place]) 16-Dec-2021   Depression    FH: heart disease 16-Dec-2021   Aunt died early 62s of some weird heart thing 3 aunts total died in 30s of heart problems Hasn't spoken to father since 1979 so doesn't know that history but knows they have high longevity.    Osteoarthritis of lower back 2021/12/16   MRI 2021 Rarely ever hurts  T12- L1: Asymmetric leftward disc narrowing and bulging with endplate ridging. Asymmetric left facet  spurring. Moderate left foraminal narrowing. Patent spinal canal. Bulky left far-lateral spur deforming the upper psoas.   L1-L2: Asymmetric leftward disc narrowing and endplate ridging with bulging. Moderate left foraminal narrowing. The canal is patent   L2-L3: Asymmet   Overweight 2021/12/16   Thrombocytopenia ([place]) 12-16-2021   Past Surgical History:  Procedure Laterality Date   COLONOSCOPY WITH PROPOFOL N/A 02/18/2016   Procedure: COLONOSCOPY WITH PROPOFOL;  Surgeon: Garlan Fair, MD;  Location: [facility];  Service: Endoscopy;  Laterality: N/A;   Past Surgical History:  Procedure Laterality Date   COLONOSCOPY WITH PROPOFOL N/A 02/18/2016   Procedure: COLONOSCOPY WITH PROPOFOL;  Surgeon: Garlan Fair, MD;  Location: [facility]COPY;  Service: Endoscopy;  Laterality: N/A;   Family Status  Relation Name Status   Mother  (Not Specified)   Family History  Problem Relation Age of Onset   Heart disease Mother    Outpatient Medications Prior to Visit  Medication Sig Dispense Refill   albuterol (VENTOLIN HFA) 108 (90 Base) MCG/ACT inhaler Inhale 2 puffs into the lungs every 6 (six) hours as needed for wheezing or shortness of breath. 8 g 2   b complex vitamins tablet Take 1 tablet by mouth daily.     Boswellia-Glucosamine-Vit D (GLUCOSAMINE COMPLEX PO) Take 1 tablet by mouth daily.     CALCIUM PO Take 1 tablet by mouth daily.     cholecalciferol (VITAMIN D) 1000 units tablet Take 1,000 Units by mouth daily.     Coenzyme Q10 (CO Q 10) 100 MG CAPS Take 100 mg by mouth daily.      DHEA 50 MG CAPS Take 100 mg by mouth daily.      fish oil-omega-3 fatty acids 1000 MG capsule Take 1 g by mouth daily.     Flaxseed, Linseed, (FLAXSEED OIL) 1000 MG CAPS Take 1,000 mg by mouth daily.     FLUoxetine (PROZAC) 20 MG tablet Take 20 mg by mouth daily.     ibuprofen (ADVIL) 800 MG tablet Take 1 tablet (800 mg total) by mouth 2 (two) times daily as needed. 180 tablet 0   lamoTRIgine (LAMICTAL)  200 MG tablet Take 400 mg by mouth daily.     Magnesium 100 MG CAPS Take 1 capsule by mouth daily.     Multiple Vitamins-Minerals (MULTIVITAMIN WITH MINERALS) tablet Take 1 tablet by mouth daily.     niacin 250 MG tablet Take 250 mg by mouth at bedtime.     QUEtiapine (SEROQUEL XR) 400 MG 24 hr tablet Take 150 mg by mouth at bedtime.     selenium 50 MCG TABS tablet Take 50 mcg by mouth daily.     vitamin E 400 UNIT capsule Take  400 Units by mouth daily.     Zinc 50 MG CAPS Take 1 capsule by mouth daily.     amphetamine-dextroamphetamine (ADDERALL) 20 MG tablet Take 20 mg by mouth 3 (three) times daily.     aspirin 81 MG tablet Take 81 mg by mouth daily. (Patient not taking: Reported on 11/20/2021)     VRAYLAR 3 MG capsule Take 3 mg by mouth daily.     meloxicam (MOBIC) 15 MG tablet TAKE ONE TABLET BY MOUTH DAILY 30 tablet 2   predniSONE (DELTASONE) 10 MG tablet Take 2 tablets (20 mg total) by mouth daily with breakfast. 50 tablet 1   No facility-administered medications prior to visit.    Not on File Social History   Tobacco Use   Smoking status: Never   Smokeless tobacco: Never  Vaping Use   Vaping Use: Never used  Substance Use Topics   Alcohol use: No   Drug use: No    Immunization History  Administered Date(s) Administered   Tdap 12/17/2019     Objective:  BP 138/68   Pulse 63   Temp 98 F (36.7 C)   Ht 5\' 8"  (1.727 m)   Wt 185 lb 12.8 oz (84.3 kg)   SpO2 97%   BMI 28.25 kg/m  Body mass index is 28.25 kg/m.  He  is a very cordial and polite person who was a pleasure to meet.  Gen: NAD, resting comfortably HEENT: Mucous membranes are moist. Sclera conjunctiva and lids grossly normal Neck: no thyromegaly, no cervical lymphadExt: no edema Skin: warm, dry Neuro: grossly intact  No images are attached to the encounter or orders placed in the encounter.  Results for orders placed or performed in visit on 03/11/21  TSH  Result Value Ref Range   TSH 1.02 0.35 -  5.50 uIU/mL  CBC with Differential/Platelet  Result Value Ref Range   WBC 5.2 4.0 - 10.5 K/uL   RBC 5.12 4.22 - 5.81 Mil/uL   Hemoglobin 14.7 13.0 - 17.0 g/dL   HCT 44.5 39.0 - 52.0 %   MCV 87.0 78.0 - 100.0 fl   MCHC 33.1 30.0 - 36.0 g/dL   RDW 13.5 11.5 - 15.5 %   Platelets 142.0 (L) 150.0 - 400.0 K/uL   Neutrophils Relative % 47.1 43.0 - 77.0 %   Lymphocytes Relative 32.0 12.0 - 46.0 %   Monocytes Relative 13.2 (H) 3.0 - 12.0 %   Eosinophils Relative 6.5 (H) 0.0 - 5.0 %   Basophils Relative 1.2 0.0 - 3.0 %   Neutro Abs 2.5 1.4 - 7.7 K/uL   Lymphs Abs 1.7 0.7 - 4.0 K/uL   Monocytes Absolute 0.7 0.1 - 1.0 K/uL   Eosinophils Absolute 0.3 0.0 - 0.7 K/uL   Basophils Absolute 0.1 0.0 - 0.1 K/uL  Comprehensive metabolic panel  Result Value Ref Range   Glucose, Bld 89 65 - 99 mg/dL   BUN 28 (H) 7 - 25 mg/dL   Creat 1.30 0.70 - 1.35 mg/dL   BUN/Creatinine Ratio 22 6 - 22 (calc)   Sodium 140 135 - 146 mmol/L   Potassium 4.1 3.5 - 5.3 mmol/L   Chloride 106 98 - 110 mmol/L   CO2 21 20 - 32 mmol/L   Calcium 9.0 8.6 - 10.3 mg/dL   Total Protein 6.5 6.1 - 8.1 g/dL   Albumin 4.1 3.6 - 5.1 g/dL   Globulin 2.4 1.9 - 3.7 g/dL (calc)   AG Ratio 1.7 1.0 - 2.5 (calc)  Total Bilirubin 0.4 0.2 - 1.2 mg/dL   Alkaline phosphatase (APISO) 101 35 - 144 U/L   AST 17 10 - 35 U/L   ALT 18 9 - 46 U/L  Hemoglobin A1c  Result Value Ref Range   Hgb A1c MFr Bld 6.3 4.6 - 6.5 %  Lipid panel  Result Value Ref Range   Cholesterol 248 (H) <200 mg/dL   HDL 44 > OR = 40 mg/dL   Triglycerides 145 <150 mg/dL   LDL Cholesterol (Calc) 175 (H) mg/dL (calc)   Total CHOL/HDL Ratio 5.6 (H) <5.0 (calc)   Non-HDL Cholesterol (Calc) 204 (H) <130 mg/dL (calc)

## 2021-11-20 NOTE — Assessment & Plan Note (Signed)
Following with Dr. Robina Ade

## 2021-11-20 NOTE — Patient Instructions (Addendum)
It was a pleasure seeing you today!  Today the plan is...  Primary osteoarthritis of right knee Assessment & Plan: .   Overweight  FH: heart disease  Osteoarthritis of lower back Assessment & Plan: Stable, not bothering Recommended limit contact sports (ice hockey)   Hyperlipidemia, acquired  Thrombocytopenia (HCC)  Attention deficit hyperactivity disorder (ADHD), unspecified ADHD type  Bipolar 2 disorder [facility]) Assessment & Plan: Following with Dr. Robina Ade    Unilateral primary osteoarthritis, right knee     Eric Pacas, MD   Return in about 4 months (around 03/23/2022) for welcome to medicare AWV.  If you are not doing as well as expected, call and return to the office sooner If your condition begins to worsen or become severe:  go to the ER If you have follow-up questions / concerns: please contact me via phone [phone] or MyChart messaging Please bring all your medicines to your next appointment. This is the best way for me to know exactly what you're taking.    IF you received an x-ray today, you will receive an invoice from [facility]. Please contact [facility] at [phone] with questions or concerns regarding your invoice.    IF you received labwork today, you will receive an invoice from [facility]. Please contact [facility] at [phone] with questions or concerns regarding your invoice.    Our billing staff will not be able to assist you with questions regarding bills from these companies.   --------------------------------------------------------------------------------------------------------------------  You will be contacted with the lab results as soon as they are available. The fastest way to get your results is to activate your My Chart account. Instructions are located on the last page of this paperwork. If you have not heard from [place] regarding the results in 2 weeks, please contact this office. For any labs or imaging  tests, we will call you if the results are significantly abnormal.  Most normal results will be posted to myChart as soon as they are available and I will comment on them there within 2-3 business days.

## 2021-12-30 DIAGNOSIS — H40023 Open angle with borderline findings, high risk, bilateral: Secondary | ICD-10-CM | POA: Diagnosis not present

## 2021-12-30 DIAGNOSIS — H40013 Open angle with borderline findings, low risk, bilateral: Secondary | ICD-10-CM | POA: Diagnosis not present

## 2022-01-05 ENCOUNTER — Other Ambulatory Visit: Payer: Self-pay | Admitting: Family Medicine

## 2022-01-05 DIAGNOSIS — M545 Low back pain, unspecified: Secondary | ICD-10-CM

## 2022-01-13 ENCOUNTER — Telehealth: Payer: Self-pay | Admitting: Physician Assistant

## 2022-01-13 NOTE — Telephone Encounter (Signed)
Patient wants to know if his ibuprofen 800

## 2022-01-20 NOTE — Pre-Procedure Instructions (Signed)
Surgical Instructions    Your procedure is scheduled on Monday, January 8th.  Report to [facility] Main Entrance "A" at 08:30 A.M., then check in with the Admitting office.  Call this number if you have problems the morning of surgery:  [phone]   If you have any questions prior to your surgery date call [phone]: Open Monday-Friday 8am-4pm    Remember:  Do not eat after midnight the night before your surgery  You may drink clear liquids until 08:00 AM the morning of your surgery.   Clear liquids allowed are: Water, Non-Citrus Juices (without pulp), Carbonated Beverages, Clear Tea, Black Coffee Only (NO MILK, CREAM OR POWDERED CREAMER of any kind), and Gatorade.   Patient Instructions  The night before surgery:  No food after midnight. ONLY clear liquids after midnight  The day of surgery (if you do NOT have diabetes):  Drink ONE (1) Pre-Surgery Clear Ensure by 08:00 AM the morning of surgery. Drink in one sitting. Do not sip.  This drink was given to you during your hospital  pre-op appointment visit.  Nothing else to drink after completing the  Pre-Surgery Clear Ensure.          If you have questions, please contact your surgeon's office.     Take these medicines the morning of surgery with A SIP OF WATER  lamoTRIgine (LAMICTAL)  lurasidone (LATUDA)   If needed: albuterol (VENTOLIN HFA)- if needed, bring with you on day of surgery   As of today, STOP taking any Aspirin (unless otherwise instructed by your surgeon) Aleve, Naproxen, Ibuprofen, Motrin, Advil, Goody's, BC's, all herbal medications, fish oil, and all vitamins.                     Do NOT Smoke (Tobacco/Vaping) for 24 hours prior to your procedure.  If you use a CPAP at night, you may bring your mask/headgear for your overnight stay.   Contacts, glasses, piercing's, hearing aid's, dentures or partials may not be worn into surgery, please bring cases for these belongings.    For patients admitted  to the hospital, discharge time will be determined by your treatment team.   Patients discharged the day of surgery will not be allowed to drive home, and someone needs to stay with them for 24 hours.  SURGICAL WAITING ROOM VISITATION Patients having surgery or a procedure may have no more than 2 support people in the waiting area - these visitors may rotate.   Children under the age of 50 must have an adult with them who is not the patient. If the patient needs to stay at the hospital during part of their recovery, the visitor guidelines for inpatient rooms apply. Pre-op nurse will coordinate an appropriate time for 1 support person to accompany patient in pre-op.  This support person may not rotate.   Please refer to the [facility] website for the visitor guidelines for Inpatients (after your surgery is over and you are in a regular room).    Special instructions:   [place]- Preparing For Surgery  Before surgery, you can play an important role. Because skin is not sterile, your skin needs to be as free of germs as possible. You can reduce the number of germs on your skin by washing with CHG (chlorahexidine gluconate) Soap before surgery.  CHG is an antiseptic cleaner which kills germs and bonds with the skin to continue killing germs even after washing.    Oral Hygiene is also important to  reduce your risk of infection.  Remember - BRUSH YOUR TEETH THE MORNING OF SURGERY WITH YOUR REGULAR TOOTHPASTE  Please do not use if you have an allergy to CHG or antibacterial soaps. If your skin becomes reddened/irritated stop using the CHG.  Do not shave (including legs and underarms) for at least 48 hours prior to first CHG shower. It is OK to shave your face.  Please follow these instructions carefully.   Shower the NIGHT BEFORE SURGERY and the MORNING OF SURGERY  If you chose to wash your hair, wash your hair first as usual with your normal shampoo.  After you shampoo, rinse your hair and  body thoroughly to remove the shampoo.  Use CHG Soap as you would any other liquid soap. You can apply CHG directly to the skin and wash gently with a scrungie or a clean washcloth.   Apply the CHG Soap to your body ONLY FROM THE NECK DOWN.  Do not use on open wounds or open sores. Avoid contact with your eyes, ears, mouth and genitals (private parts). Wash Face and genitals (private parts)  with your normal soap.   Wash thoroughly, paying special attention to the area where your surgery will be performed.  Thoroughly rinse your body with warm water from the neck down.  DO NOT shower/wash with your normal soap after using and rinsing off the CHG Soap.  Pat yourself dry with a CLEAN TOWEL.  Wear CLEAN PAJAMAS to bed the night before surgery  Place CLEAN SHEETS on your bed the night before your surgery  DO NOT SLEEP WITH PETS.   Day of Surgery: Take a shower with CHG soap. Do not wear jewelry or makeup Do not wear lotions, powders, colognes, or deodorant. Men may shave face and neck. Do not bring valuables to the hospital. [facility] is not responsible for any belongings or valuables.  Wear Clean/Comfortable clothing the morning of surgery Remember to brush your teeth WITH YOUR REGULAR TOOTHPASTE.   Please read over the following fact sheets that you were given.    If you received a COVID test during your pre-op visit  it is requested that you wear a mask when out in public, stay away from anyone that may not be feeling well and notify your surgeon if you develop symptoms. If you have been in contact with anyone that has tested positive in the last 10 days please notify you surgeon.

## 2022-01-21 ENCOUNTER — Encounter (HOSPITAL_COMMUNITY)
Admission: RE | Admit: 2022-01-21 | Discharge: 2022-01-21 | Disposition: A | Discharge status: Home / Self Care | Payer: Medicare HMO | Source: Ambulatory Visit | Attending: Orthopaedic Surgery | Admitting: Orthopaedic Surgery

## 2022-01-21 ENCOUNTER — Encounter (HOSPITAL_COMMUNITY): Payer: Self-pay

## 2022-01-21 ENCOUNTER — Other Ambulatory Visit: Payer: Self-pay

## 2022-01-21 VITALS — BP 150/75 | HR 65 | Temp 97.8°F | Resp 17 | Ht 67.0 in | Wt 174.7 lb

## 2022-01-21 DIAGNOSIS — Z01812 Encounter for preprocedural laboratory examination: Secondary | ICD-10-CM | POA: Insufficient documentation

## 2022-01-21 DIAGNOSIS — M1711 Unilateral primary osteoarthritis, right knee: Secondary | ICD-10-CM | POA: Insufficient documentation

## 2022-01-21 DIAGNOSIS — Z01818 Encounter for other preprocedural examination: Secondary | ICD-10-CM

## 2022-01-21 LAB — CBC
HCT: 47.8 % (ref 39.0–52.0)
Hemoglobin: 16.2 g/dL (ref 13.0–17.0)
MCH: 29.3 pg (ref 26.0–34.0)
MCHC: 33.9 g/dL (ref 30.0–36.0)
MCV: 86.4 fL (ref 80.0–100.0)
Platelets: 206 10*3/uL (ref 150–400)
RBC: 5.53 MIL/uL (ref 4.22–5.81)
RDW: 12.9 % (ref 11.5–15.5)
WBC: 6.5 10*3/uL (ref 4.0–10.5)
nRBC: 0 % (ref 0.0–0.2)

## 2022-01-21 LAB — BASIC METABOLIC PANEL
Anion gap: 11 (ref 5–15)
BUN: 14 mg/dL (ref 8–23)
CO2: 24 mmol/L (ref 22–32)
Calcium: 9.3 mg/dL (ref 8.9–10.3)
Chloride: 101 mmol/L (ref 98–111)
Creatinine, Ser: 1.47 mg/dL — ABNORMAL HIGH (ref 0.61–1.24)
GFR, Estimated: 53 mL/min — ABNORMAL LOW (ref >60)
Glucose, Bld: 117 mg/dL — ABNORMAL HIGH (ref 70–99)
Potassium: 3.7 mmol/L (ref 3.5–5.1)
Sodium: 136 mmol/L (ref 135–145)

## 2022-01-21 LAB — SURGICAL PCR SCREEN
MRSA, PCR: NEGATIVE
Staphylococcus aureus: NEGATIVE

## 2022-01-21 NOTE — Progress Notes (Signed)
PCP - Dr. Glenetta Hew Cardiologist - denies  PPM/ICD - denies   Chest x-ray - denies EKG - denies Stress Test - denies ECHO - denies Cardiac Cath - denies  Sleep Study - denies   DM- denies    ASA/Blood Thinner Instructions: n/a   ERAS Protcol - yes PRE-SURGERY Ensure given at PAT  COVID TEST- n/a   Anesthesia review: no  Patient denies shortness of breath, fever, cough and chest pain at PAT appointment   All instructions explained to the patient, with a verbal understanding of the material. Patient agrees to go over the instructions while at home for a better understanding.  The opportunity to ask questions was provided.

## 2022-01-28 ENCOUNTER — Other Ambulatory Visit: Payer: Self-pay | Admitting: Physician Assistant

## 2022-01-28 MED ORDER — ASPIRIN 81 MG PO TBEC: 81.0000 mg | DELAYED_RELEASE_TABLET | Freq: Two times a day (BID) | ORAL | 0 refills | Status: DC

## 2022-01-28 MED ORDER — DOCUSATE SODIUM 100 MG PO CAPS: 100.0000 mg | ORAL_CAPSULE | Freq: Every day | ORAL | 2 refills | Status: DC | PRN

## 2022-01-28 MED ORDER — OXYCODONE-ACETAMINOPHEN 5-325 MG PO TABS: 1.0000 | ORAL_TABLET | Freq: Four times a day (QID) | ORAL | 0 refills | Status: DC | PRN

## 2022-01-28 MED ORDER — METHOCARBAMOL 750 MG PO TABS: 750.0000 mg | ORAL_TABLET | Freq: Two times a day (BID) | ORAL | 2 refills | Status: DC | PRN

## 2022-01-28 MED ORDER — ONDANSETRON HCL 4 MG PO TABS: 4.0000 mg | ORAL_TABLET | Freq: Three times a day (TID) | ORAL | 0 refills | Status: DC | PRN

## 2022-01-30 MED ORDER — TRANEXAMIC ACID 1000 MG/10ML IV SOLN
2000.0000 mg | INTRAVENOUS | Status: DC
  Filled 2022-01-30: qty 20

## 2022-02-01 NOTE — Anesthesia Preprocedure Evaluation (Signed)
Anesthesia Evaluation  Patient identified by MRN, date of birth, ID band Patient awake    Reviewed: Allergy & Precautions, NPO status , Patient's Chart, lab work & pertinent test results  History of Anesthesia Complications Negative for: history of anesthetic complications  Airway Mallampati: IV  TM Distance: >3 FB Neck ROM: Full    Dental  (+) Partial Lower, Partial Upper   Pulmonary neg pulmonary ROS   Pulmonary exam normal        Cardiovascular negative cardio ROS Normal cardiovascular exam     Neuro/Psych    Depression Bipolar Disorder   negative neurological ROS     GI/Hepatic negative GI ROS, Neg liver ROS,,,  Endo/Other  negative endocrine ROS    Renal/GU Renal InsufficiencyRenal disease (Cr 1.47)  negative genitourinary   Musculoskeletal  (+) Arthritis ,    Abdominal   Peds  Hematology negative hematology ROS (+)   Anesthesia Other Findings Day of surgery medications reviewed with patient.  Reproductive/Obstetrics                              Anesthesia Physical Anesthesia Plan  ASA: 2  Anesthesia Plan: Spinal   Post-op Pain Management: Tylenol PO (pre-op)* and Regional block*   Induction:   PONV Risk Score and Plan: 2 and Treatment may vary due to age or medical condition, Ondansetron, Propofol infusion, Dexamethasone and Midazolam  Airway Management Planned: Natural Airway and Simple Face Mask  Additional Equipment: None  Intra-op Plan:   Post-operative Plan:   Informed Consent: I have reviewed the patients History and Physical, chart, labs and discussed the procedure including the risks, benefits and alternatives for the proposed anesthesia with the patient or authorized representative who has indicated his/her understanding and acceptance.       Plan Discussed with: CRNA  Anesthesia Plan Comments:         Anesthesia Quick Evaluation

## 2022-02-02 ENCOUNTER — Observation Stay (HOSPITAL_COMMUNITY): Payer: Medicare HMO

## 2022-02-02 ENCOUNTER — Encounter (HOSPITAL_COMMUNITY)
Admission: RE | Disposition: A | Discharge status: Home / Self Care | Payer: Self-pay | Source: Home / Self Care | Attending: Orthopaedic Surgery

## 2022-02-02 ENCOUNTER — Ambulatory Visit (HOSPITAL_COMMUNITY): Payer: Medicare HMO | Admitting: Anesthesiology

## 2022-02-02 ENCOUNTER — Ambulatory Visit (HOSPITAL_BASED_OUTPATIENT_CLINIC_OR_DEPARTMENT_OTHER): Payer: Medicare HMO | Admitting: Anesthesiology

## 2022-02-02 ENCOUNTER — Encounter (HOSPITAL_COMMUNITY): Payer: Self-pay | Admitting: Orthopaedic Surgery

## 2022-02-02 ENCOUNTER — Observation Stay (HOSPITAL_COMMUNITY)
Admission: RE | Admit: 2022-02-02 | Discharge: 2022-02-03 | Disposition: A | Discharge status: Home / Self Care | Payer: Medicare HMO | Attending: Orthopaedic Surgery | Admitting: Orthopaedic Surgery

## 2022-02-02 ENCOUNTER — Other Ambulatory Visit: Payer: Self-pay

## 2022-02-02 DIAGNOSIS — M1711 Unilateral primary osteoarthritis, right knee: Principal | ICD-10-CM | POA: Insufficient documentation

## 2022-02-02 DIAGNOSIS — Z96659 Presence of unspecified artificial knee joint: Secondary | ICD-10-CM

## 2022-02-02 DIAGNOSIS — Z471 Aftercare following joint replacement surgery: Secondary | ICD-10-CM | POA: Diagnosis not present

## 2022-02-02 DIAGNOSIS — G8918 Other acute postprocedural pain: Secondary | ICD-10-CM | POA: Diagnosis not present

## 2022-02-02 DIAGNOSIS — Z96651 Presence of right artificial knee joint: Secondary | ICD-10-CM

## 2022-02-02 DIAGNOSIS — R7309 Other abnormal glucose: Secondary | ICD-10-CM | POA: Diagnosis not present

## 2022-02-02 DIAGNOSIS — M21161 Varus deformity, not elsewhere classified, right knee: Secondary | ICD-10-CM | POA: Diagnosis not present

## 2022-02-02 DIAGNOSIS — Z01818 Encounter for other preprocedural examination: Secondary | ICD-10-CM | POA: Diagnosis not present

## 2022-02-02 HISTORY — PX: TOTAL KNEE ARTHROPLASTY: SHX125

## 2022-02-02 SURGERY — ARTHROPLASTY, KNEE, TOTAL
Anesthesia: Spinal | Site: Knee | Laterality: Right

## 2022-02-02 MED ORDER — DEXAMETHASONE SODIUM PHOSPHATE 10 MG/ML IJ SOLN
INTRAMUSCULAR | Status: AC
  Filled 2022-02-02: qty 1

## 2022-02-02 MED ORDER — OXYCODONE HCL 5 MG/5ML PO SOLN: 5.0000 mg | Freq: Once | ORAL | Status: DC | PRN

## 2022-02-02 MED ORDER — EPHEDRINE 5 MG/ML INJ
INTRAVENOUS | Status: AC
  Filled 2022-02-02: qty 5

## 2022-02-02 MED ORDER — VANCOMYCIN HCL 1000 MG IV SOLR
INTRAVENOUS | Status: DC | PRN
  Administered 2022-02-02: 1000 mg via TOPICAL

## 2022-02-02 MED ORDER — ACETAMINOPHEN 325 MG PO TABS: 325.0000 mg | ORAL_TABLET | Freq: Four times a day (QID) | ORAL | Status: DC | PRN

## 2022-02-02 MED ORDER — 0.9 % SODIUM CHLORIDE (POUR BTL) OPTIME
TOPICAL | Status: DC | PRN
  Administered 2022-02-02: 1000 mL

## 2022-02-02 MED ORDER — KETOROLAC TROMETHAMINE 15 MG/ML IJ SOLN
15.0000 mg | Freq: Four times a day (QID) | INTRAMUSCULAR | Status: DC
  Administered 2022-02-02 – 2022-02-03 (×3): 15 mg via INTRAVENOUS
  Filled 2022-02-02 (×3): qty 1

## 2022-02-02 MED ORDER — SODIUM CHLORIDE 0.9 % IR SOLN
Status: DC | PRN
  Administered 2022-02-02: 1000 mL

## 2022-02-02 MED ORDER — MIDAZOLAM HCL 2 MG/2ML IJ SOLN
INTRAMUSCULAR | Status: AC
  Filled 2022-02-02: qty 2

## 2022-02-02 MED ORDER — VANCOMYCIN HCL 1000 MG IV SOLR
INTRAVENOUS | Status: AC
  Filled 2022-02-02: qty 20

## 2022-02-02 MED ORDER — CEFAZOLIN SODIUM-DEXTROSE 2-4 GM/100ML-% IV SOLN
2.0000 g | INTRAVENOUS | Status: AC
  Administered 2022-02-02: 2 g via INTRAVENOUS
  Filled 2022-02-02: qty 100

## 2022-02-02 MED ORDER — DEXAMETHASONE SODIUM PHOSPHATE 10 MG/ML IJ SOLN
INTRAMUSCULAR | Status: DC | PRN
  Administered 2022-02-02: 10 mg via INTRAVENOUS

## 2022-02-02 MED ORDER — POVIDONE-IODINE 10 % EX SWAB
2.0000 | Freq: Once | CUTANEOUS | Status: AC
  Administered 2022-02-02: 2 via TOPICAL

## 2022-02-02 MED ORDER — FENTANYL CITRATE (PF) 100 MCG/2ML IJ SOLN
INTRAMUSCULAR | Status: AC
  Filled 2022-02-02: qty 2

## 2022-02-02 MED ORDER — ONDANSETRON HCL 4 MG PO TABS: 4.0000 mg | ORAL_TABLET | Freq: Four times a day (QID) | ORAL | Status: DC | PRN

## 2022-02-02 MED ORDER — OXYCODONE HCL 5 MG PO TABS
5.0000 mg | ORAL_TABLET | ORAL | Status: DC | PRN
  Administered 2022-02-03: 10 mg via ORAL

## 2022-02-02 MED ORDER — BUPIVACAINE-MELOXICAM ER 400-12 MG/14ML IJ SOLN
INTRAMUSCULAR | Status: AC
  Filled 2022-02-02: qty 1

## 2022-02-02 MED ORDER — LIDOCAINE 2% (20 MG/ML) 5 ML SYRINGE
INTRAMUSCULAR | Status: AC
  Filled 2022-02-02: qty 5

## 2022-02-02 MED ORDER — TRANEXAMIC ACID 1000 MG/10ML IV SOLN
INTRAVENOUS | Status: DC | PRN
  Administered 2022-02-02: 2000 mg via TOPICAL

## 2022-02-02 MED ORDER — MIDAZOLAM HCL 2 MG/2ML IJ SOLN
INTRAMUSCULAR | Status: DC | PRN
  Administered 2022-02-02: 2 mg via INTRAVENOUS

## 2022-02-02 MED ORDER — ACETAMINOPHEN 500 MG PO TABS
1000.0000 mg | ORAL_TABLET | Freq: Once | ORAL | Status: AC
  Administered 2022-02-02: 1000 mg via ORAL
  Filled 2022-02-02: qty 2

## 2022-02-02 MED ORDER — PROPOFOL 10 MG/ML IV BOLUS
INTRAVENOUS | Status: AC
  Filled 2022-02-02: qty 20

## 2022-02-02 MED ORDER — LACTATED RINGERS IV SOLN: INTRAVENOUS | Status: DC

## 2022-02-02 MED ORDER — AMISULPRIDE (ANTIEMETIC) 5 MG/2ML IV SOLN: 10.0000 mg | Freq: Once | INTRAVENOUS | Status: DC | PRN

## 2022-02-02 MED ORDER — BUPIVACAINE IN DEXTROSE 0.75-8.25 % IT SOLN
INTRATHECAL | Status: DC | PRN
  Administered 2022-02-02: 1.6 mL via INTRATHECAL

## 2022-02-02 MED ORDER — ACETAMINOPHEN 500 MG PO TABS
1000.0000 mg | ORAL_TABLET | Freq: Four times a day (QID) | ORAL | Status: DC
  Administered 2022-02-02 – 2022-02-03 (×3): 1000 mg via ORAL
  Filled 2022-02-02 (×3): qty 2

## 2022-02-02 MED ORDER — OXYCODONE HCL 5 MG PO TABS: 5.0000 mg | ORAL_TABLET | Freq: Once | ORAL | Status: DC | PRN

## 2022-02-02 MED ORDER — EPHEDRINE SULFATE-NACL 50-0.9 MG/10ML-% IV SOSY
PREFILLED_SYRINGE | INTRAVENOUS | Status: DC | PRN
  Administered 2022-02-02: 15 mg via INTRAVENOUS
  Administered 2022-02-02 (×2): 10 mg via INTRAVENOUS
  Administered 2022-02-02: 5 mg via INTRAVENOUS
  Administered 2022-02-02: 10 mg via INTRAVENOUS

## 2022-02-02 MED ORDER — TRANEXAMIC ACID-NACL 1000-0.7 MG/100ML-% IV SOLN
1000.0000 mg | Freq: Once | INTRAVENOUS | Status: AC
  Administered 2022-02-02: 1000 mg via INTRAVENOUS
  Filled 2022-02-02: qty 100

## 2022-02-02 MED ORDER — FENTANYL CITRATE (PF) 100 MCG/2ML IJ SOLN: 25.0000 ug | INTRAMUSCULAR | Status: DC | PRN

## 2022-02-02 MED ORDER — HYDROMORPHONE HCL 1 MG/ML IJ SOLN: 0.5000 mg | INTRAMUSCULAR | Status: DC | PRN

## 2022-02-02 MED ORDER — OXYCODONE HCL ER 10 MG PO T12A
10.0000 mg | EXTENDED_RELEASE_TABLET | Freq: Two times a day (BID) | ORAL | Status: DC
  Administered 2022-02-02 – 2022-02-03 (×2): 10 mg via ORAL
  Filled 2022-02-02 (×3): qty 1

## 2022-02-02 MED ORDER — METOCLOPRAMIDE HCL 5 MG PO TABS: 5.0000 mg | ORAL_TABLET | Freq: Three times a day (TID) | ORAL | Status: DC | PRN

## 2022-02-02 MED ORDER — DEXAMETHASONE SODIUM PHOSPHATE 10 MG/ML IJ SOLN
10.0000 mg | Freq: Once | INTRAMUSCULAR | Status: AC
  Administered 2022-02-03: 10 mg via INTRAVENOUS
  Filled 2022-02-02: qty 1

## 2022-02-02 MED ORDER — METOCLOPRAMIDE HCL 5 MG/ML IJ SOLN: 5.0000 mg | Freq: Three times a day (TID) | INTRAMUSCULAR | Status: DC | PRN

## 2022-02-02 MED ORDER — PHENYLEPHRINE HCL-NACL 20-0.9 MG/250ML-% IV SOLN
INTRAVENOUS | Status: DC | PRN
  Administered 2022-02-02: 50 ug/min via INTRAVENOUS

## 2022-02-02 MED ORDER — CLONIDINE HCL (ANALGESIA) 100 MCG/ML EP SOLN
EPIDURAL | Status: DC | PRN
  Administered 2022-02-02: 100 ug

## 2022-02-02 MED ORDER — BUPIVACAINE-EPINEPHRINE (PF) 0.5% -1:200000 IJ SOLN
INTRAMUSCULAR | Status: DC | PRN
  Administered 2022-02-02: 15 mL via PERINEURAL

## 2022-02-02 MED ORDER — PHENYLEPHRINE 80 MCG/ML (10ML) SYRINGE FOR IV PUSH (FOR BLOOD PRESSURE SUPPORT)
PREFILLED_SYRINGE | INTRAVENOUS | Status: AC
  Filled 2022-02-02: qty 10

## 2022-02-02 MED ORDER — FENTANYL CITRATE (PF) 100 MCG/2ML IJ SOLN
INTRAMUSCULAR | Status: DC | PRN
  Administered 2022-02-02: 25 ug via INTRAVENOUS
  Administered 2022-02-02: 50 ug via INTRAVENOUS
  Administered 2022-02-02: 25 ug via INTRAVENOUS

## 2022-02-02 MED ORDER — PHENOL 1.4 % MT LIQD: 1.0000 | OROMUCOSAL | Status: DC | PRN

## 2022-02-02 MED ORDER — BUPIVACAINE-MELOXICAM ER 400-12 MG/14ML IJ SOLN
INTRAMUSCULAR | Status: DC | PRN
  Administered 2022-02-02: 400 mg

## 2022-02-02 MED ORDER — OXYCODONE HCL 5 MG PO TABS
10.0000 mg | ORAL_TABLET | ORAL | Status: DC | PRN
  Filled 2022-02-02: qty 2

## 2022-02-02 MED ORDER — PRONTOSAN WOUND IRRIGATION OPTIME
TOPICAL | Status: DC | PRN
  Administered 2022-02-02: 1

## 2022-02-02 MED ORDER — DOCUSATE SODIUM 100 MG PO CAPS
100.0000 mg | ORAL_CAPSULE | Freq: Two times a day (BID) | ORAL | Status: DC
  Administered 2022-02-02 – 2022-02-03 (×2): 100 mg via ORAL
  Filled 2022-02-02 (×4): qty 1

## 2022-02-02 MED ORDER — SODIUM CHLORIDE 0.9 % IV SOLN: INTRAVENOUS | Status: DC

## 2022-02-02 MED ORDER — MENTHOL 3 MG MT LOZG: 1.0000 | LOZENGE | OROMUCOSAL | Status: DC | PRN

## 2022-02-02 MED ORDER — ONDANSETRON HCL 4 MG/2ML IJ SOLN
INTRAMUSCULAR | Status: AC
  Filled 2022-02-02: qty 2

## 2022-02-02 MED ORDER — PROPOFOL 10 MG/ML IV BOLUS
INTRAVENOUS | Status: DC | PRN
  Administered 2022-02-02: 30 mg via INTRAVENOUS

## 2022-02-02 MED ORDER — LIDOCAINE 2% (20 MG/ML) 5 ML SYRINGE
INTRAMUSCULAR | Status: DC | PRN
  Administered 2022-02-02: 40 mg via INTRAVENOUS

## 2022-02-02 MED ORDER — ORAL CARE MOUTH RINSE: 15.0000 mL | Freq: Once | OROMUCOSAL | Status: AC

## 2022-02-02 MED ORDER — PROPOFOL 500 MG/50ML IV EMUL
INTRAVENOUS | Status: DC | PRN
  Administered 2022-02-02: 100 ug/kg/min via INTRAVENOUS

## 2022-02-02 MED ORDER — TRANEXAMIC ACID-NACL 1000-0.7 MG/100ML-% IV SOLN
1000.0000 mg | INTRAVENOUS | Status: AC
  Administered 2022-02-02: 1000 mg via INTRAVENOUS
  Filled 2022-02-02: qty 100

## 2022-02-02 MED ORDER — GLYCOPYRROLATE PF 0.2 MG/ML IJ SOSY
PREFILLED_SYRINGE | INTRAMUSCULAR | Status: AC
  Filled 2022-02-02: qty 1

## 2022-02-02 MED ORDER — LAMOTRIGINE 100 MG PO TABS
200.0000 mg | ORAL_TABLET | Freq: Every day | ORAL | Status: DC
  Administered 2022-02-02 – 2022-02-03 (×2): 200 mg via ORAL
  Filled 2022-02-02: qty 2
  Filled 2022-02-02: qty 8
  Filled 2022-02-02: qty 2

## 2022-02-02 MED ORDER — FERROUS SULFATE 325 (65 FE) MG PO TABS
325.0000 mg | ORAL_TABLET | Freq: Three times a day (TID) | ORAL | Status: DC
  Administered 2022-02-02 – 2022-02-03 (×2): 325 mg via ORAL
  Filled 2022-02-02 (×2): qty 1

## 2022-02-02 MED ORDER — METHOCARBAMOL 1000 MG/10ML IJ SOLN
500.0000 mg | Freq: Four times a day (QID) | INTRAVENOUS | Status: DC | PRN
  Filled 2022-02-02: qty 5

## 2022-02-02 MED ORDER — METHOCARBAMOL 500 MG PO TABS: 500.0000 mg | ORAL_TABLET | Freq: Four times a day (QID) | ORAL | Status: DC | PRN

## 2022-02-02 MED ORDER — CHLORHEXIDINE GLUCONATE 0.12 % MT SOLN
15.0000 mL | Freq: Once | OROMUCOSAL | Status: AC
  Administered 2022-02-02: 15 mL via OROMUCOSAL
  Filled 2022-02-02: qty 15

## 2022-02-02 MED ORDER — ONDANSETRON HCL 4 MG/2ML IJ SOLN: 4.0000 mg | Freq: Four times a day (QID) | INTRAMUSCULAR | Status: DC | PRN

## 2022-02-02 MED ORDER — PHENYLEPHRINE 80 MCG/ML (10ML) SYRINGE FOR IV PUSH (FOR BLOOD PRESSURE SUPPORT)
PREFILLED_SYRINGE | INTRAVENOUS | Status: DC | PRN
  Administered 2022-02-02: 80 ug via INTRAVENOUS

## 2022-02-02 MED ORDER — QUETIAPINE FUMARATE ER 50 MG PO TB24
150.0000 mg | ORAL_TABLET | Freq: Every day | ORAL | Status: DC
  Administered 2022-02-02: 150 mg via ORAL
  Filled 2022-02-02 (×2): qty 3

## 2022-02-02 MED ORDER — GLYCOPYRROLATE 0.2 MG/ML IJ SOLN
INTRAMUSCULAR | Status: DC | PRN
  Administered 2022-02-02: .2 mg via INTRAVENOUS

## 2022-02-02 MED ORDER — CEFAZOLIN SODIUM-DEXTROSE 2-4 GM/100ML-% IV SOLN
2.0000 g | Freq: Four times a day (QID) | INTRAVENOUS | Status: AC
  Administered 2022-02-02 (×2): 2 g via INTRAVENOUS
  Filled 2022-02-02 (×2): qty 100

## 2022-02-02 MED ORDER — ASPIRIN 81 MG PO CHEW
81.0000 mg | CHEWABLE_TABLET | Freq: Two times a day (BID) | ORAL | Status: DC
  Administered 2022-02-02 – 2022-02-03 (×2): 81 mg via ORAL
  Filled 2022-02-02 (×2): qty 1

## 2022-02-02 MED ORDER — ONDANSETRON HCL 4 MG/2ML IJ SOLN
INTRAMUSCULAR | Status: DC | PRN
  Administered 2022-02-02: 4 mg via INTRAVENOUS

## 2022-02-02 SURGICAL SUPPLY — 81 items
ALCOHOL 70% 16 OZ (MISCELLANEOUS) ×1 IMPLANT
BAG COUNTER SPONGE SURGICOUNT (BAG) IMPLANT
BAG DECANTER FOR FLEXI CONT (MISCELLANEOUS) ×1 IMPLANT
BANDAGE ESMARK 6X9 LF (GAUZE/BANDAGES/DRESSINGS) IMPLANT
BLADE SAG 18X100X1.27 (BLADE) ×1 IMPLANT
BLADE SAW SAG 29X58X.64 (BLADE) IMPLANT
BNDG ESMARK 6X9 LF (GAUZE/BANDAGES/DRESSINGS)
BOWL SMART MIX CTS (DISPOSABLE) ×1 IMPLANT
CLSR STERI-STRIP ANTIMIC 1/2X4 (GAUZE/BANDAGES/DRESSINGS) ×2 IMPLANT
COMP FEM PS KNEE STD 10 RT (Joint) ×1 IMPLANT
COMP PATELLAR 10X35 METAL (Joint) ×1 IMPLANT
COMP TIB KNEE PS G 0 RT (Joint) ×1 IMPLANT
COMPONENT FEM PS KN STD 10 RT (Joint) IMPLANT
COMPONENT PATELLAR 10X35 METAL (Joint) IMPLANT
COMPONENT TIB KNEE PS G 0 RT (Joint) IMPLANT
COOLER ICEMAN CLASSIC (MISCELLANEOUS) ×1 IMPLANT
COVER SURGICAL LIGHT HANDLE (MISCELLANEOUS) ×1 IMPLANT
CUFF TOURN SGL QUICK 34 (TOURNIQUET CUFF) ×1
CUFF TOURN SGL QUICK 42 (TOURNIQUET CUFF) IMPLANT
CUFF TRNQT CYL 34X4.125X (TOURNIQUET CUFF) ×1 IMPLANT
DERMABOND ADVANCED .7 DNX12 (GAUZE/BANDAGES/DRESSINGS) ×1 IMPLANT
DRAPE EXTREMITY T 121X128X90 (DISPOSABLE) ×1 IMPLANT
DRAPE HALF SHEET 40X57 (DRAPES) ×1 IMPLANT
DRAPE INCISE IOBAN 66X45 STRL (DRAPES) ×1 IMPLANT
DRAPE ORTHO SPLIT 77X108 STRL (DRAPES) ×2
DRAPE POUCH INSTRU U-SHP 10X18 (DRAPES) ×1 IMPLANT
DRAPE SURG ORHT 6 SPLT 77X108 (DRAPES) ×2 IMPLANT
DRAPE U-SHAPE 47X51 STRL (DRAPES) ×2 IMPLANT
DRSG AQUACEL AG ADV 3.5X10 (GAUZE/BANDAGES/DRESSINGS) ×1 IMPLANT
DURAPREP 26ML APPLICATOR (WOUND CARE) ×3 IMPLANT
ELECT CAUTERY BLADE 6.4 (BLADE) ×1 IMPLANT
ELECT REM PT RETURN 9FT ADLT (ELECTROSURGICAL) ×1
ELECTRODE REM PT RTRN 9FT ADLT (ELECTROSURGICAL) ×1 IMPLANT
GLOVE BIOGEL PI IND STRL 7.0 (GLOVE) ×2 IMPLANT
GLOVE BIOGEL PI IND STRL 7.5 (GLOVE) ×5 IMPLANT
GLOVE ECLIPSE 7.0 STRL STRAW (GLOVE) ×3 IMPLANT
GLOVE SKINSENSE STRL SZ7.5 (GLOVE) ×3 IMPLANT
GLOVE SURG SYN 7.5  E (GLOVE) ×2
GLOVE SURG SYN 7.5 E (GLOVE) ×2 IMPLANT
GLOVE SURG SYN 7.5 PF PI (GLOVE) ×2 IMPLANT
GLOVE SURG UNDER LTX SZ7.5 (GLOVE) ×2 IMPLANT
GLOVE SURG UNDER POLY LF SZ7 (GLOVE) ×2 IMPLANT
GOWN STRL REIN XL XLG (GOWN DISPOSABLE) ×1 IMPLANT
GOWN STRL REUS W/ TWL LRG LVL3 (GOWN DISPOSABLE) ×1 IMPLANT
GOWN STRL REUS W/TWL LRG LVL3 (GOWN DISPOSABLE) ×1
HANDPIECE INTERPULSE COAX TIP (DISPOSABLE) ×1
HDLS TROCR DRIL PIN KNEE 75 (PIN) ×1
HOOD PEEL AWAY FLYTE STAYCOOL (MISCELLANEOUS) ×2 IMPLANT
INSERT TIB ASF PS 8-11 GH RT (Insert) IMPLANT
KIT BASIN OR (CUSTOM PROCEDURE TRAY) ×1 IMPLANT
KIT TURNOVER KIT B (KITS) ×1 IMPLANT
MANIFOLD NEPTUNE II (INSTRUMENTS) ×1 IMPLANT
MARKER SKIN DUAL TIP RULER LAB (MISCELLANEOUS) ×2 IMPLANT
NDL SPNL 18GX3.5 QUINCKE PK (NEEDLE) ×1 IMPLANT
NEEDLE SPNL 18GX3.5 QUINCKE PK (NEEDLE) ×1 IMPLANT
NS IRRIG 1000ML POUR BTL (IV SOLUTION) ×1 IMPLANT
PACK TOTAL JOINT (CUSTOM PROCEDURE TRAY) ×1 IMPLANT
PAD ARMBOARD 7.5X6 YLW CONV (MISCELLANEOUS) ×2 IMPLANT
PAD COLD SHLDR WRAP-ON (PAD) ×1 IMPLANT
PIN DRILL HDLS TROCAR 75 4PK (PIN) IMPLANT
SAW OSC TIP CART 19.5X105X1.3 (SAW) ×1 IMPLANT
SCREW FEMALE HEX FIX 25X2.5 (ORTHOPEDIC DISPOSABLE SUPPLIES) IMPLANT
SET HNDPC FAN SPRY TIP SCT (DISPOSABLE) ×1 IMPLANT
SOLUTION PRONTOSAN WOUND 350ML (IRRIGATION / IRRIGATOR) ×1 IMPLANT
STAPLER VISISTAT 35W (STAPLE) IMPLANT
SUCTION FRAZIER HANDLE 10FR (MISCELLANEOUS) ×1
SUCTION TUBE FRAZIER 10FR DISP (MISCELLANEOUS) ×1 IMPLANT
SUT ETHILON 2 0 FS 18 (SUTURE) IMPLANT
SUT MNCRL AB 3-0 PS2 27 (SUTURE) IMPLANT
SUT VIC AB 0 CT1 27 (SUTURE)
SUT VIC AB 0 CT1 27XBRD ANBCTR (SUTURE) ×2 IMPLANT
SUT VIC AB 1 CTX 27 (SUTURE) ×3 IMPLANT
SUT VIC AB 2-0 CT1 27 (SUTURE) ×4
SUT VIC AB 2-0 CT1 TAPERPNT 27 (SUTURE) ×4 IMPLANT
SYR 50ML LL SCALE MARK (SYRINGE) ×2 IMPLANT
TOWEL GREEN STERILE (TOWEL DISPOSABLE) ×1 IMPLANT
TOWEL GREEN STERILE FF (TOWEL DISPOSABLE) ×1 IMPLANT
TRAY CATH 16FR W/PLASTIC CATH (SET/KITS/TRAYS/PACK) IMPLANT
TUBE SUCT ARGYLE STRL (TUBING) ×1 IMPLANT
UNDERPAD 30X36 HEAVY ABSORB (UNDERPADS AND DIAPERS) ×1 IMPLANT
YANKAUER SUCT BULB TIP NO VENT (SUCTIONS) ×2 IMPLANT

## 2022-02-02 NOTE — H&P (Signed)
PREOPERATIVE H&P  Chief Complaint: RIGHT KNEE DEGENERATIVE JOINT DISEASE  HPI: Eric Crane is a 67 y.o. male who presents for surgical treatment of RIGHT KNEE DEGENERATIVE JOINT DISEASE.  He denies any changes in medical history.  Past Medical History:  Diagnosis Date   Bipolar 2 disorder ([place]) December 20, 2021   Depression    FH: heart disease Dec 20, 2021   Aunt died early 47s of some weird heart thing 3 aunts total died in 80s of heart problems Hasn't spoken to father since 1979 so doesn't know that history but knows they have high longevity.    Osteoarthritis of lower back 20-Dec-2021   MRI 2021 Rarely ever hurts  T12- L1: Asymmetric leftward disc narrowing and bulging with endplate ridging. Asymmetric left facet spurring. Moderate left foraminal narrowing. Patent spinal canal. Bulky left far-lateral spur deforming the upper psoas.   L1-L2: Asymmetric leftward disc narrowing and endplate ridging with bulging. Moderate left foraminal narrowing. The canal is patent   L2-L3: Asymmet   Overweight 12/20/21   Thrombocytopenia ([place]) 20-Dec-2021   Past Surgical History:  Procedure Laterality Date   COLONOSCOPY WITH PROPOFOL N/A 02/18/2016   Procedure: COLONOSCOPY WITH PROPOFOL;  Surgeon: Garlan Fair, MD;  Location: [facility];  Service: Endoscopy;  Laterality: N/A;   TONSILLECTOMY     removed as a child   Social History   Socioeconomic History   Marital status: Divorced    Spouse name: Not on file   Number of children: 0   Years of education: Not on file   Highest education level: Not on file  Occupational History   Occupation: Advertising copywriter  Tobacco Use   Smoking status: Never   Smokeless tobacco: Never  Vaping Use   Vaping Use: Never used  Substance and Sexual Activity   Alcohol use: No   Drug use: No   Sexual activity: Not on file  Other Topics Concern   Not on file  Social History Narrative   Not on file   Social Determinants of Health   Financial  Resource Strain: Not on file  Food Insecurity: Not on file  Transportation Needs: Not on file  Physical Activity: Not on file  Stress: Not on file  Social Connections: Not on file   Family History  Problem Relation Age of Onset   Heart disease Mother    No Known Allergies Prior to Admission medications   Medication Sig Start Date End Date Taking? Authorizing Provider  albuterol (VENTOLIN HFA) 108 (90 Base) MCG/ACT inhaler Inhale 2 puffs into the lungs every 6 (six) hours as needed for wheezing or shortness of breath. 03/10/21  Yes Maximiano Coss, NP  amphetamine-dextroamphetamine (ADDERALL) 20 MG tablet Take 20 mg by mouth 3 (three) times daily. 10/20/21  Yes [provider]  aspirin EC 81 MG tablet Take 1 tablet (81 mg total) by mouth 2 (two) times daily. To be taken after surgery to prevent blood clots 01/28/22 01/28/23  Aundra Dubin, PA-C  docusate sodium (COLACE) 100 MG capsule Take 1 capsule (100 mg total) by mouth daily as needed. 01/28/22 01/28/23  Aundra Dubin, PA-C  ibuprofen (ADVIL) 800 MG tablet Take 1 tablet (800 mg total) by mouth 2 (two) times daily as needed. 07/07/21  Yes Maximiano Coss, NP  lamoTRIgine (LAMICTAL) 200 MG tablet Take 200 mg by mouth daily.   Yes [provider]  lurasidone (LATUDA) 40 MG TABS tablet Take 40 mg by mouth daily. 01/06/22  Yes [provider]  methocarbamol (  ROBAXIN-750) 750 MG tablet Take 1 tablet (750 mg total) by mouth 2 (two) times daily as needed for muscle spasms. 01/28/22   Cristie Hem, PA-C  ondansetron (ZOFRAN) 4 MG tablet Take 1 tablet (4 mg total) by mouth every 8 (eight) hours as needed for nausea or vomiting. 01/28/22   Cristie Hem, PA-C  oxyCODONE-acetaminophen (PERCOCET) 5-325 MG tablet Take 1-2 tablets by mouth every 6 (six) hours as needed. To be taken after surgery 01/28/22   Cristie Hem, PA-C  QUEtiapine Fumarate (SEROQUEL XR) 150 MG 24 hr tablet Take 150 mg by mouth at bedtime.   Yes [provider]  Coenzyme Q10 (CO Q 10) 100 MG CAPS Take 100 mg by mouth 3 (three) times a week.    [provider]  DHEA 50 MG CAPS Take 100 mg by mouth 3 (three) times a week.    [provider]  Misc Natural Products (GLUCOSAMINE CHOND MSM FORMULA PO) Take 1 tablet by mouth 3 (three) times a week.    [provider]  Multiple Vitamins-Minerals (MULTIVITAMIN WITH MINERALS) tablet Take 1 tablet by mouth 3 (three) times a week.    [provider]     Positive ROS: All other systems have been reviewed and were otherwise negative with the exception of those mentioned in the HPI and as above.  Physical Exam: General: Alert, no acute distress Cardiovascular: No pedal edema Respiratory: No cyanosis, no use of accessory musculature GI: abdomen soft Skin: No lesions in the area of chief complaint Neurologic: Sensation intact distally Psychiatric: Patient is competent for consent with normal mood and affect Lymphatic: no lymphedema  MUSCULOSKELETAL: exam stable  Assessment: RIGHT KNEE DEGENERATIVE JOINT DISEASE  Plan: Plan for Procedure(s): RIGHT TOTAL KNEE ARTHROPLASTY  The risks benefits and alternatives were discussed with the patient including but not limited to the risks of nonoperative treatment, versus surgical intervention including infection, bleeding, nerve injury,  blood clots, cardiopulmonary complications, morbidity, mortality, among others, and they were willing to proceed.   Glee Arvin, MD 02/02/2022 8:47 AM

## 2022-02-02 NOTE — Anesthesia Procedure Notes (Signed)
Anesthesia Regional Block: Adductor canal block   Pre-Anesthetic Checklist: , timeout performed,  Correct Patient, Correct Site, Correct Laterality,  Correct Procedure, Correct Position, site marked,  Risks and benefits discussed,  Pre-op evaluation,  At surgeon's request and post-op pain management  Laterality: Right  Prep: Maximum Sterile Barrier Precautions used, chloraprep       Needles:  Injection technique: Single-shot  Needle Type: Echogenic Stimulator Needle     Needle Length: 9cm  Needle Gauge: 22     Additional Needles:   Procedures:,,,, ultrasound used (permanent image in chart),,    Narrative:  Start time: 02/02/2022 10:36 AM End time: 02/02/2022 10:39 AM Injection made incrementally with aspirations every 5 mL.  Performed by: Personally  Anesthesiologist: Brennan Bailey, MD  Additional Notes: Risks, benefits, and alternative discussed. Patient gave consent for procedure. Patient prepped and draped in sterile fashion. Sedation administered, patient remains easily responsive to voice. Relevant anatomy identified with ultrasound guidance. Local anesthetic given in 5cc increments with no signs or symptoms of intravascular injection. No pain or paraesthesias with injection. Patient monitored throughout procedure with signs of LAST or immediate complications. Tolerated well. Ultrasound image placed in chart.  Tawny Asal, MD

## 2022-02-02 NOTE — Evaluation (Signed)
Physical Therapy Evaluation Patient Details Name: Eric Crane MRN: [id number] DOB: [birth year] Today's Date: 02/02/2022  History of Present Illness  Pt is 67 yo male s/p R TKA on 02/02/21.  Pt with hx including bipolar disorder, depression,and OA.  Clinical Impression  Pt is s/p TKA resulting in the deficits listed below (see PT Problem List). Pt normally independent and active (enjoys playing hockey).  Pt seen on POD #0 for PT evaluation and did very well with excellent ROM and quad activation, reports 8/10 pain but eager to move and tolerated mobility well.  Pt ambulating 40' with room with RW and min guard for safety.  Expected to progress very well with therapy.  Pt will benefit from skilled PT to increase their independence and safety with mobility to allow discharge to the venue listed below.         Recommendations for follow up therapy are one component of a multi-disciplinary discharge planning process, led by the attending physician.  Recommendations may be updated based on patient status, additional functional criteria and insurance authorization.  Follow Up Recommendations Follow physician's recommendations for discharge plan and follow up therapies      Assistance Recommended at Discharge Intermittent Supervision/Assistance  Patient can return home with the following  A little help with walking and/or transfers;A little help with bathing/dressing/bathroom;Assistance with cooking/housework;Help with stairs or ramp for entrance    Equipment Recommendations None recommended by PT  Recommendations for Other Services       Functional Status Assessment Patient has had a recent decline in their functional status and demonstrates the ability to make significant improvements in function in a reasonable and predictable amount of time.     Precautions / Restrictions Precautions Precautions: Fall Restrictions Weight Bearing Restrictions: Yes RLE Weight Bearing: Weight bearing as  tolerated      Mobility  Bed Mobility Overal bed mobility: Needs Assistance Bed Mobility: Supine to Sit, Sit to Supine     Supine to sit: Supervision Sit to supine: Supervision        Transfers Overall transfer level: Needs assistance Equipment used: Rolling walker (2 wheels) Transfers: Sit to/from Stand Sit to Stand: Min guard           General transfer comment: Cues for hand placement and R LE management    Ambulation/Gait Ambulation/Gait assistance: Min guard Gait Distance (Feet): 40 Feet Assistive device: Rolling walker (2 wheels) Gait Pattern/deviations: Step-through pattern Gait velocity: decreased     General Gait Details: near normal gait pattern with slight decrease stance time on R; cues for RW  Stairs            Wheelchair Mobility    Modified Rankin (Stroke Patients Only)       Balance Overall balance assessment: Needs assistance Sitting-balance support: No upper extremity supported Sitting balance-Leahy Scale: Normal     Standing balance support: Bilateral upper extremity supported, No upper extremity supported Standing balance-Leahy Scale: Fair Standing balance comment: RW to ambulate but could static stand without AD                             Pertinent Vitals/Pain Pain Assessment Pain Assessment: 0-10 Pain Score: 8  Pain Location: R knee Pain Descriptors / Indicators: Throbbing Pain Intervention(s): Limited activity within patient's tolerance, Monitored during session, RN gave pain meds during session    [name] expects to be discharged to:: Private residence Living Arrangements: Spouse/significant other Available Help at Discharge:  Family;Available 24 hours/day Type of Home: House Home Access: Other (comment) (tiny threshold)       Home Layout: One level Home Equipment: Pharmacist, hospital (2 wheels);Cane - single point      Prior Function Prior Level of Function :  Independent/Modified Independent;Working/employed;Driving             Mobility Comments: Likes to play hockey       Higher education careers adviser        Extremity/Trunk Assessment   Upper Extremity Assessment Upper Extremity Assessment: Overall WFL for tasks assessed    Lower Extremity Assessment Lower Extremity Assessment: LLE deficits/detail;RLE deficits/detail RLE Deficits / Details: Expected post op changes; ROM : R knee ~0 to 90 degrees; MMT: ankle 5/5, hip and knee 3/5 not further tested LLE Deficits / Details: ROM WFL; MMT 5/5    Cervical / Trunk Assessment Cervical / Trunk Assessment: Normal  Communication   Communication: No difficulties  Cognition Arousal/Alertness: Awake/alert Behavior During Therapy: WFL for tasks assessed/performed Overall Cognitive Status: Within Functional Limits for tasks assessed                                          General Comments General comments (skin integrity, edema, etc.): Educated no pivots, resting with leg straigh, safe ice use, and encouraged ankle pumps quad sets tonight    Exercises     Assessment/Plan    PT Assessment Patient needs continued PT services  PT Problem List Decreased strength;Pain;Decreased range of motion;Decreased activity tolerance;Decreased knowledge of use of DME;Decreased balance;Decreased mobility;Decreased knowledge of precautions       PT Treatment Interventions DME instruction;Therapeutic exercise;Gait training;Balance training;Stair training;Functional mobility training;Therapeutic activities;Patient/family education;Modalities    PT Goals (Current goals can be found in the Care Plan section)  Acute Rehab PT Goals Patient Stated Goal: return to playing hockey PT Goal Formulation: With patient Time For Goal Achievement: 02/16/22 Potential to Achieve Goals: Good    Frequency 7X/week     Co-evaluation               AM-PAC PT "6 Clicks" Mobility  Outcome Measure Help  needed turning from your back to your side while in a flat bed without using bedrails?: None Help needed moving from lying on your back to sitting on the side of a flat bed without using bedrails?: None Help needed moving to and from a bed to a chair (including a wheelchair)?: A Little Help needed standing up from a chair using your arms (e.g., wheelchair or bedside chair)?: A Little Help needed to walk in hospital room?: A Little Help needed climbing 3-5 steps with a railing? : A Little 6 Click Score: 20    End of Session Equipment Utilized During Treatment: Gait belt Activity Tolerance: Patient tolerated treatment well Patient left: in bed;with call bell/phone within reach Nurse Communication: Mobility status PT Visit Diagnosis: Other abnormalities of gait and mobility (R26.89);Muscle weakness (generalized) (M62.81)    Time: 1941-7408 PT Time Calculation (min) (ACUTE ONLY): 21 min   Charges:   PT Evaluation $PT Eval Low Complexity: 1 Low          Tobin Cadiente, PT Acute Rehab [facility] [phone]   Rayetta Humphrey 02/02/2022, 4:15 PM

## 2022-02-02 NOTE — Discharge Instructions (Signed)

## 2022-02-02 NOTE — Transfer of Care (Signed)
Immediate Anesthesia Transfer of Care Note  Patient: Eric Crane  Procedure(s) Performed: RIGHT TOTAL KNEE ARTHROPLASTY (Right: Knee)  Patient Location: PACU  Anesthesia Type:MAC combined with regional for post-op pain  Level of Consciousness: awake, alert , and oriented  Airway & Oxygen Therapy: Patient Spontanous Breathing  Post-op Assessment: Report given to RN  Post vital signs: Reviewed and stable  Last Vitals:  Vitals Value Taken Time  BP 117/76 02/02/22 1243  Temp    Pulse 62 02/02/22 1245  Resp 16 02/02/22 1245  SpO2 94 % 02/02/22 1245  Vitals shown include unvalidated device data.  Last Pain:  Vitals:   02/02/22 0848  TempSrc:   PainSc: 0-No pain         Complications: No notable events documented.

## 2022-02-02 NOTE — Anesthesia Procedure Notes (Signed)
Spinal  Patient location during procedure: OR Start time: 02/02/2022 10:52 AM End time: 02/02/2022 10:55 AM Reason for block: surgical anesthesia Staffing Performed: anesthesiologist  Anesthesiologist: Brennan Bailey, MD Performed by: Brennan Bailey, MD Authorized by: Brennan Bailey, MD   Preanesthetic Checklist Completed: patient identified, IV checked, risks and benefits discussed, surgical consent, monitors and equipment checked, pre-op evaluation and timeout performed Spinal Block Patient position: sitting Prep: DuraPrep and site prepped and draped Patient monitoring: continuous pulse ox, blood pressure and heart rate Approach: midline Location: L3-4 Injection technique: single-shot Needle Needle type: Pencan  Needle gauge: 24 G Needle length: 9 cm Assessment Events: CSF return Additional Notes Risks, benefits, and alternative discussed. Patient gave consent to procedure. Prepped and draped in sitting position. Patient sedated but responsive to voice. Clear CSF obtained after one needle pass. Positive terminal aspiration. No pain or paraesthesias with injection. Patient tolerated procedure well. Vital signs stable. Tawny Asal, MD

## 2022-02-02 NOTE — Op Note (Signed)
Total Knee Arthroplasty Procedure Note  Preoperative diagnosis: Right knee osteoarthritis  Postoperative diagnosis:same  Operative findings: Varus deformity Good bone quality  Operative procedure: Right total knee arthroplasty. CPT [phone]  Surgeon: N. Glee Arvin, MD  Assist: Oneal Grout, PA-C; necessary for the timely completion of procedure and due to complexity of procedure.  Anesthesia: Spinal, regional  Tourniquet time: see anesthesia record  Implants used: Zimmer persona uncemented Femur: CR 10 Tibia: G Patella: 35 mm Polyethylene: 12 mm, MC  Indication: Aaro Meyers is a 67 y.o. year old male with a history of knee pain. Having failed conservative management, the patient elected to proceed with a total knee arthroplasty.  We have reviewed the risk and benefits of the surgery and they elected to proceed after voicing understanding.  Procedure:  After informed consent was obtained and understanding of the risk were voiced including but not limited to bleeding, infection, damage to surrounding structures including nerves and vessels, blood clots, leg length inequality and the failure to achieve desired results, the operative extremity was marked with verbal confirmation of the patient in the holding area.   The patient was then brought to the operating room and transported to the operating room table in the supine position.  A tourniquet was applied to the operative extremity around the upper thigh. The operative limb was then prepped and draped in the usual sterile fashion and preoperative antibiotics were administered.  A time out was performed prior to the start of surgery confirming the correct extremity, preoperative antibiotic administration, as well as team members, implants and instruments available for the case. Correct surgical site was also confirmed with preoperative radiographs. The limb was then elevated for exsanguination and the tourniquet was  inflated. A midline incision was made and a standard medial parapatellar approach was performed.  The patella was prepared and sized to a 35 mm.  A cover was placed on the patella for protection from retractors.  We then turned our attention to the femur. Posterior cruciate ligament was sacrificed. Start site was drilled in the femur and the intramedullary distal femoral cutting guide was placed, set at 5 degrees valgus, taking 10 mm of distal resection. The distal cut was made. Osteophytes were then removed.   Next, the proximal tibial cutting guide was placed with appropriate slope, varus/valgus alignment and depth of resection. The proximal tibial cut was made taking 2 mm off the low side. Gap blocks were then used to assess the extension gap and alignment, and appropriate soft tissue releases were performed. Attention was turned back to the femur, which was sized using the sizing guide to a size 10. Appropriate rotation of the femoral component was determined using epicondylar axis, Whiteside's line, and assessing the flexion gap under ligament tension. The appropriate size 4-in-1 cutting block was placed and cuts were made.  Posterior femoral osteophytes and uncapped bone were then removed with the curved osteotome.  Trial components were placed, and stability was checked in full extension, mid-flexion, and deep flexion. Proper tibial rotation was determined and marked.  The patella tracked well without a lateral release. Trial components were then removed and tibial preparation performed.  The tibia was sized for a size G component.  The bony surfaces were irrigated with a pulse lavage and then dried. Final components were placed.  The stability of the construct was re-evaluated throughout a range of motion and found to be acceptable. The trial liner was removed, the knee was copiously irrigated, and the knee was  re-evaluated for any excess bone debris. The real polyethylene liner, 12 mm thick, was  inserted and checked to ensure the locking mechanism had engaged appropriately. The tourniquet was deflated and hemostasis was achieved. The wound was irrigated with normal saline.  One gram of vancomycin powder was placed in the surgical bed.  Topical 0.25% bupivacaine and meloxicam was placed in the joint for postoperative pain.  Capsular closure was performed with a #1 vicryl, subcutaneous fat closed with a 0 vicryl suture, then subcutaneous tissue closed with interrupted 2.0 vicryl suture. The skin was then closed with a 2.0 nylon and dermabond. A sterile dressing was applied.  The patient was awakened in the operating room and taken to recovery in stable condition. All sponge, needle, and instrument counts were correct at the end of the case.  Tawanna Cooler was necessary for opening, closing, retracting, limb positioning and overall facilitation and completion of the surgery.  Position: supine  Complications: none.  Time Out: performed   Drains/Packing: none  Estimated blood loss: minimal  Returned to Recovery Room: in good condition.   Antibiotics: yes   Mechanical VTE (DVT) Prophylaxis: sequential compression devices, TED thigh-high  Chemical VTE (DVT) Prophylaxis: aspirin  Fluid Replacement  Crystalloid: see anesthesia record Blood: none  FFP: none   Specimens Removed: 1 to pathology   Sponge and Instrument Count Correct? yes   PACU: portable radiograph - knee AP and Lateral   Plan/RTC: Return in 2 weeks for wound check.   Weight Bearing/Load Lower Extremity: full   Implant Name Type Inv. Item Serial No. Manufacturer Lot No. LRB No. Used Action  COMP FEM PS KNEE STD 10 RT - [id number] Joint COMP FEM PS KNEE STD 10 RT  [employer](ORTH,TRAU,BIO,SG) [id number] Right 1 Implanted  COMP TIB KNEE PS G 0 RT - [id number] Joint COMP TIB KNEE PS G 0 RT  [employer](ORTH,TRAU,BIO,SG) [id number] Right 1 Implanted  INSERT TIB ASF PS 8-11 GH RT - [id number] Insert INSERT TIB ASF PS  8-11 GH RT  [employer](ORTH,TRAU,BIO,SG) [id number] Right 1 Implanted  COMP PATELLAR 10X35 METAL - [id number] Joint COMP PATELLAR 10X35 METAL  [employer](ORTH,TRAU,BIO,SG) [id number] Right 1 Implanted    N. Eduard Roux, MD [facility] 12:09 PM

## 2022-02-03 DIAGNOSIS — R7309 Other abnormal glucose: Secondary | ICD-10-CM | POA: Diagnosis not present

## 2022-02-03 DIAGNOSIS — M1711 Unilateral primary osteoarthritis, right knee: Secondary | ICD-10-CM | POA: Diagnosis not present

## 2022-02-03 LAB — CBC
HCT: 40.4 % (ref 39.0–52.0)
Hemoglobin: 13.7 g/dL (ref 13.0–17.0)
MCH: 29.3 pg (ref 26.0–34.0)
MCHC: 33.9 g/dL (ref 30.0–36.0)
MCV: 86.3 fL (ref 80.0–100.0)
Platelets: 158 10*3/uL (ref 150–400)
RBC: 4.68 MIL/uL (ref 4.22–5.81)
RDW: 13.2 % (ref 11.5–15.5)
WBC: 12.1 10*3/uL — ABNORMAL HIGH (ref 4.0–10.5)
nRBC: 0 % (ref 0.0–0.2)

## 2022-02-03 LAB — GLUCOSE, CAPILLARY: Glucose-Capillary: 144 mg/dL — ABNORMAL HIGH (ref 70–99)

## 2022-02-03 NOTE — Progress Notes (Signed)
OT Cancellation Note  Patient Details Name: Eric Crane MRN: [id number] DOB: 04/28/1955   Cancelled Treatment:    Reason Eval/Treat Not Completed: OT screened, no needs identified, will sign off Discussed with pt goals of OT with pt denying any concerns with ADL mgmt at home and reports he will have adequate assist if needed. No formal OT eval needed  Layla Maw 02/03/2022, 8:42 AM

## 2022-02-03 NOTE — Progress Notes (Signed)
Patient discharged to home with self care.  Verbalized an understanding of all discharge instructions.  Left via wheelchair in no distress

## 2022-02-03 NOTE — Plan of Care (Signed)

## 2022-02-03 NOTE — Discharge Summary (Signed)
Patient ID: Eric Crane MRN: [id number] DOB/AGE: 66-Oct-1957 66 y.o.  Admit date: 02/02/2022 Discharge date: 02/03/2022  Admission Diagnoses:  Principal Problem:   Primary osteoarthritis of right knee Active Problems:   Status post total right knee replacement   Discharge Diagnoses:  Same  Past Medical History:  Diagnosis Date   Bipolar 2 disorder (HCC) 19-Dec-2021   Depression    FH: heart disease 12-19-21   Aunt died early 54s of some weird heart thing 3 aunts total died in 27s of heart problems Hasn't spoken to father since 1979 so doesn't know that history but knows they have high longevity.    Osteoarthritis of lower back 12/19/2021   MRI 2021 Rarely ever hurts  T12- L1: Asymmetric leftward disc narrowing and bulging with endplate ridging. Asymmetric left facet spurring. Moderate left foraminal narrowing. Patent spinal canal. Bulky left far-lateral spur deforming the upper psoas.   L1-L2: Asymmetric leftward disc narrowing and endplate ridging with bulging. Moderate left foraminal narrowing. The canal is patent   L2-L3: Asymmet   Overweight 12-19-21   Thrombocytopenia (HCC) 12/19/2021    Surgeries: Procedure(s): RIGHT TOTAL KNEE ARTHROPLASTY on 02/02/2022   Consultants:   Discharged Condition: Improved  Hospital Course: Eric Crane is an 67 y.o. male who was admitted 02/02/2022 for operative treatment ofPrimary osteoarthritis of right knee. Patient has severe unremitting pain that affects sleep, daily activities, and work/hobbies. After pre-op clearance the patient was taken to the operating room on 02/02/2022 and underwent  Procedure(s): RIGHT TOTAL KNEE ARTHROPLASTY.    Patient was given perioperative antibiotics:  Anti-infectives (From admission, onward)    Start     Dose/Rate Route Frequency Ordered Stop   02/02/22 1600  ceFAZolin (ANCEF) IVPB 2g/100 mL premix        2 g 200 mL/hr over 30 Minutes Intravenous Every 6 hours 02/02/22 1432 02/02/22 2148   02/02/22  1118  vancomycin (VANCOCIN) powder  Status:  Discontinued          As needed 02/02/22 1118 02/02/22 1240   02/02/22 0830  ceFAZolin (ANCEF) IVPB 2g/100 mL premix        2 g 200 mL/hr over 30 Minutes Intravenous On call to O.R. 02/02/22 0932 02/02/22 1042        Patient was given sequential compression devices, early ambulation, and chemoprophylaxis to prevent DVT.  Patient benefited maximally from hospital stay and there were no complications.    Recent vital signs: Patient Vitals for the past 24 hrs:  BP Temp Temp src Pulse Resp SpO2 Height Weight  02/03/22 0008 (!) 146/75 98.1 F (36.7 C) Oral (!) 59 16 94 % -- --  02/02/22 1630 (!) 153/81 97.7 F (36.5 C) Oral (!) 54 16 99 % -- --  02/02/22 1420 (!) 124/46 97.7 F (36.5 C) Oral (!) 54 16 96 % -- --  02/02/22 1400 -- 97.7 F (36.5 C) -- -- -- -- -- --  02/02/22 1330 -- 97.7 F (36.5 C) -- -- -- -- -- --  02/02/22 1245 118/77 -- -- 62 17 94 % -- --  02/02/22 1243 117/76 97.9 F (36.6 C) -- 63 (!) 6 96 % -- --  02/02/22 0830 (!) 161/79 97.9 F (36.6 C) Oral (!) 56 18 97 % 5\' 7"  (1.702 m) 79.4 kg     Recent laboratory studies:  Recent Labs    02/03/22 0327  WBC 12.1*  HGB 13.7  HCT 40.4  PLT 158     Discharge Medications:   Allergies  as of 02/03/2022   No Known Allergies      Medication List     STOP taking these medications    ibuprofen 800 MG tablet Commonly known as: ADVIL       TAKE these medications    albuterol 108 (90 Base) MCG/ACT inhaler Commonly known as: VENTOLIN HFA Inhale 2 puffs into the lungs every 6 (six) hours as needed for wheezing or shortness of breath.   amphetamine-dextroamphetamine 20 MG tablet Commonly known as: ADDERALL Take 20 mg by mouth 3 (three) times daily.   aspirin EC 81 MG tablet Take 1 tablet (81 mg total) by mouth 2 (two) times daily. To be taken after surgery to prevent blood clots   Co Q 10 100 MG Caps Take 100 mg by mouth 3 (three) times a week.   DHEA 50  MG Caps Take 100 mg by mouth 3 (three) times a week.   docusate sodium 100 MG capsule Commonly known as: Colace Take 1 capsule (100 mg total) by mouth daily as needed.   GLUCOSAMINE CHOND MSM FORMULA PO Take 1 tablet by mouth 3 (three) times a week.   lamoTRIgine 200 MG tablet Commonly known as: LAMICTAL Take 200 mg by mouth daily.   lurasidone 40 MG Tabs tablet Commonly known as: LATUDA Take 40 mg by mouth daily.   methocarbamol 750 MG tablet Commonly known as: Robaxin-750 Take 1 tablet (750 mg total) by mouth 2 (two) times daily as needed for muscle spasms.   multivitamin with minerals tablet Take 1 tablet by mouth 3 (three) times a week.   ondansetron 4 MG tablet Commonly known as: Zofran Take 1 tablet (4 mg total) by mouth every 8 (eight) hours as needed for nausea or vomiting.   oxyCODONE-acetaminophen 5-325 MG tablet Commonly known as: Percocet Take 1-2 tablets by mouth every 6 (six) hours as needed. To be taken after surgery   QUEtiapine Fumarate 150 MG 24 hr tablet Commonly known as: SEROQUEL XR Take 150 mg by mouth at bedtime.               Durable Medical Equipment  (From admission, onward)           Start     Ordered   02/02/22 1433  DME Walker rolling  Once       Question Answer Comment  Walker: With 5 Inch Wheels   Patient needs a walker to treat with the following condition Status post left partial knee replacement      02/02/22 1432   02/02/22 1433  DME 3 n 1  Once        02/02/22 1432   02/02/22 1433  DME Bedside commode  Once       Question:  Patient needs a bedside commode to treat with the following condition  Answer:  Status post left partial knee replacement   02/02/22 1432            Diagnostic Studies: DG Knee Right Port  Result Date: 02/02/2022 CLINICAL DATA:  Postop knee replacement EXAM: PORTABLE RIGHT KNEE - 2 VIEW COMPARISON:  Preop x-ray 08/12/2021 FINDINGS: Portable x-rays demonstrates evidence of total knee  arthroplasty with Press-Fit components. Expected alignment. No fracture or dislocation. Mild soft tissue thickening noted with some air in the joint space and in the soft tissues. Imaging was obtained to aid in treatment. IMPRESSION: Acute surgical changes of total knee arthroplasty Electronically Signed   By: Jill Side M.D.   On: 02/02/2022 13:22  Disposition: Discharge disposition: 01-Home or Self Care          Follow-up Information     Tarry Kos, MD. Schedule an appointment as soon as possible for a visit in 2 week(s).   Specialty: Orthopedic Surgery Contact information: [address] [place] [address] [phone]                  Signed: Cristie Hem 02/03/2022, 8:11 AM

## 2022-02-03 NOTE — Progress Notes (Signed)
CSW spoke with patient at bedside to obtain his physical address. Patient's address is [place], [place], [place].  [place] spoke with Claiborne Billings of [facility] who states the agency will provide the patient with [facility] services. CSW provided [facility] with patient's physical address. [facility] staff will contact the patient directly to initiate services.  Madilyn Fireman, MSW, LCSW Transitions of Care  Clinical Social Worker II [phone]

## 2022-02-03 NOTE — Progress Notes (Signed)
Subjective: 1 Day Post-Op Procedure(s) (LRB): RIGHT TOTAL KNEE ARTHROPLASTY (Right) Patient reports pain as mild.    Objective: Vital signs in last 24 hours: Temp:  [97.7 F (36.5 C)-98.1 F (36.7 C)] 98.1 F (36.7 C) (01/09 0008) Pulse Rate:  [54-63] 59 (01/09 0008) Resp:  [6-18] 16 (01/09 0008) BP: (117-161)/(46-81) 146/75 (01/09 0008) SpO2:  [94 %-99 %] 94 % (01/09 0008) Weight:  [79.4 kg] 79.4 kg (01/08 0830)  Intake/Output from previous day: 01/08 0701 - 01/09 0700 In: 100 [I.V.:100] Out: 1820 [Urine:1800; Blood:20] Intake/Output this shift: No intake/output data recorded.  Recent Labs    02/03/22 0327  HGB 13.7   Recent Labs    02/03/22 0327  WBC 12.1*  RBC 4.68  HCT 40.4  PLT 158   No results for input(s): "NA", "K", "CL", "CO2", "BUN", "CREATININE", "GLUCOSE", "CALCIUM" in the last 72 hours. No results for input(s): "LABPT", "INR" in the last 72 hours.  Neurologically intact Neurovascular intact Sensation intact distally Intact pulses distally Dorsiflexion/Plantar flexion intact Incision: dressing C/D/I No cellulitis present Compartment soft   Assessment/Plan: 1 Day Post-Op Procedure(s) (LRB): RIGHT TOTAL KNEE ARTHROPLASTY (Right) Advance diet Up with therapy D/C IV fluids Discharge home with home health after first or second PT session (once cleared by PT) WBAT RLE      Aundra Dubin 02/03/2022, 8:10 AM

## 2022-02-03 NOTE — Progress Notes (Addendum)
Physical Therapy Treatment Patient Details Name: Eric Crane MRN: [id number] DOB: 07-Jun-1955 Today's Date: 02/03/2022   History of Present Illness Pt is 67 yo male s/p R TKA on 02/02/21.  Pt with hx including bipolar disorder, depression,and OA.    PT Comments    Pt admitted with above diagnosis. Pt progressing well and probable d/c today.  Educated pt to all aspects of care and he verbalized understanding. Declined practice for threshold step.  Pt currently with functional limitations due to balance and endurance deficits. Pt will benefit from skilled PT to increase their independence and safety with mobility to allow discharge to the venue listed below.      Recommendations for follow up therapy are one component of a multi-disciplinary discharge planning process, led by the attending physician.  Recommendations may be updated based on patient status, additional functional criteria and insurance authorization.  Follow Up Recommendations  Follow physician's recommendations for discharge plan and follow up therapies     Assistance Recommended at Discharge Intermittent Supervision/Assistance  Patient can return home with the following A little help with walking and/or transfers;A little help with bathing/dressing/bathroom;Assistance with cooking/housework;Help with stairs or ramp for entrance   Equipment Recommendations  None recommended by PT    Recommendations for Other Services       Precautions / Restrictions Precautions Precautions: Fall Restrictions Weight Bearing Restrictions: Yes RLE Weight Bearing: Weight bearing as tolerated     Mobility  Bed Mobility Overal bed mobility: Needs Assistance Bed Mobility: Supine to Sit, Sit to Supine     Supine to sit: Independent Sit to supine: Independent        Transfers Overall transfer level: Needs assistance Equipment used: Rolling walker (2 wheels) Transfers: Sit to/from Stand Sit to Stand: Supervision            General transfer comment: Cues for hand placement and R LE management. PPlaced his underwear and shorts on with cues for proper dresing.    Ambulation/Gait Ambulation/Gait assistance: Min guard Gait Distance (Feet): 140 Feet Assistive device: Rolling walker (2 wheels) Gait Pattern/deviations: Step-to pattern, Decreased stride length, Decreased weight shift to right, Antalgic Gait velocity: decreased Gait velocity interpretation: 1.31 - 2.62 ft/sec, indicative of limited community ambulator   General Gait Details: near normal gait pattern with slight decrease stance time on R; cues for [employer]             Wheelchair Mobility    Modified Rankin (Stroke Patients Only)       Balance Overall balance assessment: Needs assistance Sitting-balance support: No upper extremity supported Sitting balance-Leahy Scale: Normal     Standing balance support: Bilateral upper extremity supported, No upper extremity supported Standing balance-Leahy Scale: Fair Standing balance comment: RW to ambulate but could static stand without AD                            Cognition Arousal/Alertness: Awake/alert Behavior During Therapy: WFL for tasks assessed/performed Overall Cognitive Status: Within Functional Limits for tasks assessed                                          Exercises Total Joint Exercises Ankle Circles/Pumps: AROM, Both, 10 reps, Supine Quad Sets: AROM, Both, 10 reps, Supine Towel Squeeze: AROM, Both, 10 reps, Supine Short Arc Quad: AROM, Both, 10 reps, Supine Heel  Slides: AROM, Both, 10 reps, Supine Hip ABduction/ADduction: AROM, Both, 10 reps, Supine Straight Leg Raises: AROM, Both, 10 reps, Supine Long Arc Quad: AROM, Both, 10 reps, Supine Knee Flexion: AROM, Both, 10 reps, Supine Goniometric ROM: 5-108    General Comments General comments (skin integrity, edema, etc.): Educated no pillows, resting with leg straight, safe ice use       Pertinent Vitals/Pain Pain Assessment Pain Assessment: Faces Faces Pain Scale: Hurts little more Pain Location: R knee Pain Descriptors / Indicators: Discomfort Pain Intervention(s): Limited activity within patient's tolerance, Monitored during session, Premedicated before session, Repositioned    Home Living                          Prior Function            PT Goals (current goals can now be found in the care plan section) Acute Rehab PT Goals Patient Stated Goal: return to playing hockey Progress towards PT goals: Progressing toward goals    Frequency    7X/week      PT Plan Current plan remains appropriate    Co-evaluation              AM-PAC PT "6 Clicks" Mobility   Outcome Measure  Help needed turning from your back to your side while in a flat bed without using bedrails?: None Help needed moving from lying on your back to sitting on the side of a flat bed without using bedrails?: None Help needed moving to and from a bed to a chair (including a wheelchair)?: A Little Help needed standing up from a chair using your arms (e.g., wheelchair or bedside chair)?: A Little Help needed to walk in hospital room?: A Little Help needed climbing 3-5 steps with a railing? : A Little 6 Click Score: 20    End of Session Equipment Utilized During Treatment: Gait belt Activity Tolerance: Patient tolerated treatment well Patient left: in bed;with call bell/phone within reach Nurse Communication: Mobility status PT Visit Diagnosis: Other abnormalities of gait and mobility (R26.89);Muscle weakness (generalized) (M62.81)     Time: 1610-9604 PT Time Calculation (min) (ACUTE ONLY): 33 min  Charges:  $Gait Training: 8-22 mins $Therapeutic Exercise: 8-22 mins                     [facility] M,PT Acute Rehab Services [phone]    Bevelyn Buckles 02/03/2022, 12:09 PM

## 2022-02-03 NOTE — Anesthesia Postprocedure Evaluation (Signed)
Anesthesia Post Note  Patient: Eric Crane  Procedure(s) Performed: RIGHT TOTAL KNEE ARTHROPLASTY (Right: Knee)     Patient location during evaluation: PACU Anesthesia Type: Spinal Level of consciousness: awake and alert Pain management: pain level controlled Vital Signs Assessment: post-procedure vital signs reviewed and stable Respiratory status: spontaneous breathing, nonlabored ventilation and respiratory function stable Cardiovascular status: blood pressure returned to baseline Postop Assessment: no apparent nausea or vomiting, spinal receding, no headache and no backache Anesthetic complications: no   No notable events documented.              Marthenia Rolling

## 2022-02-04 ENCOUNTER — Encounter (HOSPITAL_COMMUNITY): Payer: Self-pay | Admitting: Orthopaedic Surgery

## 2022-02-04 DIAGNOSIS — Z7982 Long term (current) use of aspirin: Secondary | ICD-10-CM | POA: Diagnosis not present

## 2022-02-04 DIAGNOSIS — Z6826 Body mass index (BMI) 26.0-26.9, adult: Secondary | ICD-10-CM | POA: Diagnosis not present

## 2022-02-04 DIAGNOSIS — E663 Overweight: Secondary | ICD-10-CM | POA: Diagnosis not present

## 2022-02-04 DIAGNOSIS — M47819 Spondylosis without myelopathy or radiculopathy, site unspecified: Secondary | ICD-10-CM | POA: Diagnosis not present

## 2022-02-04 DIAGNOSIS — Z471 Aftercare following joint replacement surgery: Secondary | ICD-10-CM | POA: Diagnosis not present

## 2022-02-04 DIAGNOSIS — D696 Thrombocytopenia, unspecified: Secondary | ICD-10-CM | POA: Diagnosis not present

## 2022-02-04 DIAGNOSIS — Z96651 Presence of right artificial knee joint: Secondary | ICD-10-CM | POA: Diagnosis not present

## 2022-02-04 DIAGNOSIS — F3181 Bipolar II disorder: Secondary | ICD-10-CM | POA: Diagnosis not present

## 2022-02-04 DIAGNOSIS — F909 Attention-deficit hyperactivity disorder, unspecified type: Secondary | ICD-10-CM | POA: Diagnosis not present

## 2022-02-06 DIAGNOSIS — E663 Overweight: Secondary | ICD-10-CM | POA: Diagnosis not present

## 2022-02-06 DIAGNOSIS — Z6826 Body mass index (BMI) 26.0-26.9, adult: Secondary | ICD-10-CM | POA: Diagnosis not present

## 2022-02-06 DIAGNOSIS — M47819 Spondylosis without myelopathy or radiculopathy, site unspecified: Secondary | ICD-10-CM | POA: Diagnosis not present

## 2022-02-06 DIAGNOSIS — D696 Thrombocytopenia, unspecified: Secondary | ICD-10-CM | POA: Diagnosis not present

## 2022-02-06 DIAGNOSIS — Z7982 Long term (current) use of aspirin: Secondary | ICD-10-CM | POA: Diagnosis not present

## 2022-02-06 DIAGNOSIS — F909 Attention-deficit hyperactivity disorder, unspecified type: Secondary | ICD-10-CM | POA: Diagnosis not present

## 2022-02-06 DIAGNOSIS — F3181 Bipolar II disorder: Secondary | ICD-10-CM | POA: Diagnosis not present

## 2022-02-06 DIAGNOSIS — Z471 Aftercare following joint replacement surgery: Secondary | ICD-10-CM | POA: Diagnosis not present

## 2022-02-06 DIAGNOSIS — Z96651 Presence of right artificial knee joint: Secondary | ICD-10-CM | POA: Diagnosis not present

## 2022-02-08 DIAGNOSIS — Z7982 Long term (current) use of aspirin: Secondary | ICD-10-CM | POA: Diagnosis not present

## 2022-02-08 DIAGNOSIS — Z96651 Presence of right artificial knee joint: Secondary | ICD-10-CM | POA: Diagnosis not present

## 2022-02-08 DIAGNOSIS — Z6826 Body mass index (BMI) 26.0-26.9, adult: Secondary | ICD-10-CM | POA: Diagnosis not present

## 2022-02-08 DIAGNOSIS — M47819 Spondylosis without myelopathy or radiculopathy, site unspecified: Secondary | ICD-10-CM | POA: Diagnosis not present

## 2022-02-08 DIAGNOSIS — E663 Overweight: Secondary | ICD-10-CM | POA: Diagnosis not present

## 2022-02-08 DIAGNOSIS — F909 Attention-deficit hyperactivity disorder, unspecified type: Secondary | ICD-10-CM | POA: Diagnosis not present

## 2022-02-08 DIAGNOSIS — F3181 Bipolar II disorder: Secondary | ICD-10-CM | POA: Diagnosis not present

## 2022-02-08 DIAGNOSIS — D696 Thrombocytopenia, unspecified: Secondary | ICD-10-CM | POA: Diagnosis not present

## 2022-02-08 DIAGNOSIS — Z471 Aftercare following joint replacement surgery: Secondary | ICD-10-CM | POA: Diagnosis not present

## 2022-02-10 DIAGNOSIS — Z7982 Long term (current) use of aspirin: Secondary | ICD-10-CM | POA: Diagnosis not present

## 2022-02-10 DIAGNOSIS — F3181 Bipolar II disorder: Secondary | ICD-10-CM | POA: Diagnosis not present

## 2022-02-10 DIAGNOSIS — Z471 Aftercare following joint replacement surgery: Secondary | ICD-10-CM | POA: Diagnosis not present

## 2022-02-10 DIAGNOSIS — D696 Thrombocytopenia, unspecified: Secondary | ICD-10-CM | POA: Diagnosis not present

## 2022-02-10 DIAGNOSIS — E663 Overweight: Secondary | ICD-10-CM | POA: Diagnosis not present

## 2022-02-10 DIAGNOSIS — Z96651 Presence of right artificial knee joint: Secondary | ICD-10-CM | POA: Diagnosis not present

## 2022-02-10 DIAGNOSIS — F909 Attention-deficit hyperactivity disorder, unspecified type: Secondary | ICD-10-CM | POA: Diagnosis not present

## 2022-02-10 DIAGNOSIS — Z6826 Body mass index (BMI) 26.0-26.9, adult: Secondary | ICD-10-CM | POA: Diagnosis not present

## 2022-02-10 DIAGNOSIS — M47819 Spondylosis without myelopathy or radiculopathy, site unspecified: Secondary | ICD-10-CM | POA: Diagnosis not present

## 2022-02-12 DIAGNOSIS — F3181 Bipolar II disorder: Secondary | ICD-10-CM | POA: Diagnosis not present

## 2022-02-12 DIAGNOSIS — E663 Overweight: Secondary | ICD-10-CM | POA: Diagnosis not present

## 2022-02-12 DIAGNOSIS — Z471 Aftercare following joint replacement surgery: Secondary | ICD-10-CM | POA: Diagnosis not present

## 2022-02-12 DIAGNOSIS — F909 Attention-deficit hyperactivity disorder, unspecified type: Secondary | ICD-10-CM | POA: Diagnosis not present

## 2022-02-12 DIAGNOSIS — D696 Thrombocytopenia, unspecified: Secondary | ICD-10-CM | POA: Diagnosis not present

## 2022-02-12 DIAGNOSIS — M47819 Spondylosis without myelopathy or radiculopathy, site unspecified: Secondary | ICD-10-CM | POA: Diagnosis not present

## 2022-02-12 DIAGNOSIS — Z6826 Body mass index (BMI) 26.0-26.9, adult: Secondary | ICD-10-CM | POA: Diagnosis not present

## 2022-02-12 DIAGNOSIS — Z7982 Long term (current) use of aspirin: Secondary | ICD-10-CM | POA: Diagnosis not present

## 2022-02-12 DIAGNOSIS — Z96651 Presence of right artificial knee joint: Secondary | ICD-10-CM | POA: Diagnosis not present

## 2022-02-16 DIAGNOSIS — Z7982 Long term (current) use of aspirin: Secondary | ICD-10-CM | POA: Diagnosis not present

## 2022-02-16 DIAGNOSIS — M47819 Spondylosis without myelopathy or radiculopathy, site unspecified: Secondary | ICD-10-CM | POA: Diagnosis not present

## 2022-02-16 DIAGNOSIS — F909 Attention-deficit hyperactivity disorder, unspecified type: Secondary | ICD-10-CM | POA: Diagnosis not present

## 2022-02-16 DIAGNOSIS — Z471 Aftercare following joint replacement surgery: Secondary | ICD-10-CM | POA: Diagnosis not present

## 2022-02-16 DIAGNOSIS — D696 Thrombocytopenia, unspecified: Secondary | ICD-10-CM | POA: Diagnosis not present

## 2022-02-16 DIAGNOSIS — E663 Overweight: Secondary | ICD-10-CM | POA: Diagnosis not present

## 2022-02-16 DIAGNOSIS — Z6826 Body mass index (BMI) 26.0-26.9, adult: Secondary | ICD-10-CM | POA: Diagnosis not present

## 2022-02-16 DIAGNOSIS — F3181 Bipolar II disorder: Secondary | ICD-10-CM | POA: Diagnosis not present

## 2022-02-16 DIAGNOSIS — Z96651 Presence of right artificial knee joint: Secondary | ICD-10-CM | POA: Diagnosis not present

## 2022-02-17 ENCOUNTER — Ambulatory Visit (INDEPENDENT_AMBULATORY_CARE_PROVIDER_SITE_OTHER): Payer: Medicare HMO | Admitting: Physician Assistant

## 2022-02-17 ENCOUNTER — Encounter: Payer: Self-pay | Admitting: Orthopaedic Surgery

## 2022-02-17 DIAGNOSIS — Z96651 Presence of right artificial knee joint: Secondary | ICD-10-CM

## 2022-02-17 NOTE — Progress Notes (Signed)
Post-Op Visit Note   Patient: Eric Crane           Date of Birth: 04/13/1955           MRN: [id number] Visit Date: 02/17/2022 PCP: Loralee Pacas, MD   Assessment & Plan:  Chief Complaint:  Chief Complaint  Patient presents with   Right Knee - Follow-up    Right total knee arthroplasty 02/02/2022   Visit Diagnoses:  1. Status post total right knee replacement     Plan: Patient is a very pleasant 67 year old gentleman who comes in today 2 weeks status post right total knee replacement 02/02/2022.  He has been doing well.  He has been compliant taking a baby aspirin twice daily for DVT prophylaxis.  He is currently taking Tylenol for pain.  He has been getting home health physical therapy and is ambulating unassisted.  Examination of his right knee reveals a well healed surgical incision with nylon sutures in place.  No evidence of infection or cellulitis.  Calves are soft nontender.  He is neurovascular intact distally.  Today, sutures were removed and Steri-Strips applied.  He will continue taking his baby aspirin twice daily for DVT prophylaxis for another 4 weeks.  Follow-up in 4 weeks for repeat evaluation and 2 view x-rays of the right knee.  Call with concerns or questions.  Follow-Up Instructions: Return in about 4 weeks (around 03/17/2022).   Orders:  No orders of the defined types were placed in this encounter.  No orders of the defined types were placed in this encounter.   Imaging: No new imaging  PMFS History: Patient Active Problem List   Diagnosis Date Noted   Status post total right knee replacement 02/02/2022   Overweight December 14, 2021   FH: heart disease December 14, 2021   Osteoarthritis of lower back 12/14/2021   Hyperlipidemia, acquired Dec 14, 2021   Thrombocytopenia ([place]) 2021-12-14   Attention deficit hyperactivity disorder (ADHD) 14-Dec-2021   Bipolar 2 disorder ([place]) 2021-12-14   Primary osteoarthritis of right knee 04/07/2021   Past Medical History:   Diagnosis Date   Bipolar 2 disorder ([place]) 14-Dec-2021   Depression    FH: heart disease December 14, 2021   Aunt died early 50s of some weird heart thing 3 aunts total died in 48s of heart problems Hasn't spoken to father since 1979 so doesn't know that history but knows they have high longevity.    Osteoarthritis of lower back Dec 14, 2021   MRI 2021 Rarely ever hurts  T12- L1: Asymmetric leftward disc narrowing and bulging with endplate ridging. Asymmetric left facet spurring. Moderate left foraminal narrowing. Patent spinal canal. Bulky left far-lateral spur deforming the upper psoas.   L1-L2: Asymmetric leftward disc narrowing and endplate ridging with bulging. Moderate left foraminal narrowing. The canal is patent   L2-L3: Asymmet   Overweight 2021/12/14   Thrombocytopenia ([place]) 2021/12/14    Family History  Problem Relation Age of Onset   Heart disease Mother     Past Surgical History:  Procedure Laterality Date   COLONOSCOPY WITH PROPOFOL N/A 02/18/2016   Procedure: COLONOSCOPY WITH PROPOFOL;  Surgeon: Garlan Fair, MD;  Location: [facility];  Service: Endoscopy;  Laterality: N/A;   TONSILLECTOMY     removed as a child   TOTAL KNEE ARTHROPLASTY Right 02/02/2022   Procedure: RIGHT TOTAL KNEE ARTHROPLASTY;  Surgeon: Leandrew Koyanagi, MD;  Location: [facility];  Service: Orthopedics;  Laterality: Right;   Social History   Occupational History   Occupation: Advertising copywriter  Tobacco  Use   Smoking status: Never   Smokeless tobacco: Never  Vaping Use   Vaping Use: Never used  Substance and Sexual Activity   Alcohol use: No   Drug use: No   Sexual activity: Not on file

## 2022-02-18 DIAGNOSIS — Z96651 Presence of right artificial knee joint: Secondary | ICD-10-CM | POA: Diagnosis not present

## 2022-02-18 DIAGNOSIS — Z471 Aftercare following joint replacement surgery: Secondary | ICD-10-CM | POA: Diagnosis not present

## 2022-02-18 DIAGNOSIS — M1711 Unilateral primary osteoarthritis, right knee: Secondary | ICD-10-CM | POA: Diagnosis not present

## 2022-02-20 ENCOUNTER — Encounter: Payer: Self-pay | Admitting: Physician Assistant

## 2022-02-20 ENCOUNTER — Ambulatory Visit: Payer: Medicare HMO | Admitting: Physician Assistant

## 2022-02-20 DIAGNOSIS — Z96651 Presence of right artificial knee joint: Secondary | ICD-10-CM | POA: Diagnosis not present

## 2022-02-20 DIAGNOSIS — M1711 Unilateral primary osteoarthritis, right knee: Secondary | ICD-10-CM

## 2022-02-20 DIAGNOSIS — Z471 Aftercare following joint replacement surgery: Secondary | ICD-10-CM | POA: Diagnosis not present

## 2022-02-20 MED ORDER — OXYCODONE-ACETAMINOPHEN 5-325 MG PO TABS: 1.0000 | ORAL_TABLET | Freq: Four times a day (QID) | ORAL | 0 refills | Status: DC | PRN

## 2022-02-20 NOTE — Progress Notes (Signed)
Office Visit Note   Patient: Eric Crane           Date of Birth: [birth year]           MRN: [id number] Visit Date: 02/20/2022              Requested by: Loralee Pacas, [address] [place],  [place] [address] PCP: Loralee Pacas, MD  Chief Complaint  Patient presents with   Right Knee - Routine Post Op      HPI: Eric Crane is a pleasant 67 year old gentleman well-known to me.  He is 3 weeks status post right total knee arthroplasty.  He has been doing very well.  Was doing some of his exercises when he had acute onset of pain across the knee.  It was bothering him enough that he had to take an oxycodone which she had not taken in a while and EMS was called.  They did not transport him.  He denies any fever or chills.  He denies any falls.  Assessment & Plan: Visit Diagnoses:  1. Primary osteoarthritis of right knee     Plan: His knee looks good today.  His calves are soft no evidence of any blood clot.  I think given the mechanism of the pain he probably just broke through some scar tissue or deep suture.  I told him to keep moving his knee but to take it easy for the next few days.  He was given strict return precautions redness swelling increased pain he should contact us immediately.  I did call him a refill of Percocet as he has not had a refill since surgery do not see any sign of infection  Follow-Up Instructions: No follow-ups on file.   Ortho Exam  Patient is alert, oriented, no adenopathy, well-dressed, normal affect, normal respiratory effort. Ex reviewed amination of his right knee he has soft tissue swelling but no erythema mild warmth.  He is neurovascularly intact compartments are soft and nontender he has no calf swelling he has a negative Homans' sign.  No evidence of cellulitis.  Incision is well-healed.  Without drainage or redness  Imaging: No results found. No images are attached to the encounter.  Labs: Lab Results  Component Value Date    HGBA1C 6.3 03/11/2021     No results found for: "ALBUMIN", "PREALBUMIN", "CBC"  No results found for: "MG" No results found for: "VD25OH"  No results found for: "PREALBUMIN"    Latest Ref Rng & Units 02/03/2022    3:27 AM 01/21/2022    8:55 AM 03/11/2021    8:12 AM  CBC EXTENDED  WBC 4.0 - 10.5 K/uL 12.1  6.5  5.2   RBC 4.22 - 5.81 MIL/uL 4.68  5.53  5.12   Hemoglobin 13.0 - 17.0 g/dL 13.7  16.2  14.7   HCT 39.0 - 52.0 % 40.4  47.8  44.5   Platelets 150 - 400 K/uL 158  206  142.0   NEUT# 1.4 - 7.7 K/uL   2.5   Lymph# 0.7 - 4.0 K/uL   1.7      There is no height or weight on file to calculate BMI.  Orders:  No orders of the defined types were placed in this encounter.  Meds ordered this encounter  Medications   oxyCODONE-acetaminophen (PERCOCET) 5-325 MG tablet    Sig: Take 1-2 tablets by mouth every 6 (six) hours as needed. To be taken after surgery    Dispense:  20  tablet    Refill:  0     Procedures: No procedures performed  Clinical Data: No additional findings.  ROS:  All other systems negative, except as noted in the HPI. Review of Systems  Objective: Vital Signs: There were no vitals taken for this visit.  Specialty Comments:  No specialty comments available.  PMFS History: Patient Active Problem List   Diagnosis Date Noted   Status post total right knee replacement 02/02/2022   Overweight 11/30/2021   FH: heart disease 11/30/2021   Osteoarthritis of lower back 11-30-2021   Hyperlipidemia, acquired 11-30-21   Thrombocytopenia ([place]) 11/30/2021   Attention deficit hyperactivity disorder (ADHD) 2021/11/30   Bipolar 2 disorder ([place]) 11/30/2021   Primary osteoarthritis of right knee 04/07/2021   Past Medical History:  Diagnosis Date   Bipolar 2 disorder ([place]) 11/30/21   Depression    FH: heart disease 30-Nov-2021   Aunt died early 21s of some weird heart thing 3 aunts total died in 19s of heart problems Hasn't spoken to father since 1979 so  doesn't know that history but knows they have high longevity.    Osteoarthritis of lower back 11-30-2021   MRI 2021 Rarely ever hurts  T12- L1: Asymmetric leftward disc narrowing and bulging with endplate ridging. Asymmetric left facet spurring. Moderate left foraminal narrowing. Patent spinal canal. Bulky left far-lateral spur deforming the upper psoas.   L1-L2: Asymmetric leftward disc narrowing and endplate ridging with bulging. Moderate left foraminal narrowing. The canal is patent   L2-L3: Asymmet   Overweight 11-30-2021   Thrombocytopenia ([place]) 11/30/2021    Family History  Problem Relation Age of Onset   Heart disease Mother     Past Surgical History:  Procedure Laterality Date   COLONOSCOPY WITH PROPOFOL N/A 02/18/2016   Procedure: COLONOSCOPY WITH PROPOFOL;  Surgeon: Garlan Fair, MD;  [facility];  Service: Endoscopy;  Laterality: N/A;   TONSILLECTOMY     removed as a child   TOTAL KNEE ARTHROPLASTY Right 02/02/2022   Procedure: RIGHT TOTAL KNEE ARTHROPLASTY;  Surgeon: Leandrew Koyanagi, MD;  Location: [facility];  Service: Orthopedics;  Laterality: Right;   Social History   Occupational History   Occupation: Advertising copywriter  Tobacco Use   Smoking status: Never   Smokeless tobacco: Never  Vaping Use   Vaping Use: Never used  Substance and Sexual Activity   Alcohol use: No   Drug use: No   Sexual activity: Not on file

## 2022-02-23 DIAGNOSIS — Z471 Aftercare following joint replacement surgery: Secondary | ICD-10-CM | POA: Diagnosis not present

## 2022-02-23 DIAGNOSIS — Z96651 Presence of right artificial knee joint: Secondary | ICD-10-CM | POA: Diagnosis not present

## 2022-02-23 DIAGNOSIS — M1711 Unilateral primary osteoarthritis, right knee: Secondary | ICD-10-CM | POA: Diagnosis not present

## 2022-02-25 DIAGNOSIS — M1711 Unilateral primary osteoarthritis, right knee: Secondary | ICD-10-CM | POA: Diagnosis not present

## 2022-02-25 DIAGNOSIS — Z471 Aftercare following joint replacement surgery: Secondary | ICD-10-CM | POA: Diagnosis not present

## 2022-02-25 DIAGNOSIS — Z96651 Presence of right artificial knee joint: Secondary | ICD-10-CM | POA: Diagnosis not present

## 2022-02-27 DIAGNOSIS — M1711 Unilateral primary osteoarthritis, right knee: Secondary | ICD-10-CM | POA: Diagnosis not present

## 2022-02-27 DIAGNOSIS — Z96651 Presence of right artificial knee joint: Secondary | ICD-10-CM | POA: Diagnosis not present

## 2022-02-27 DIAGNOSIS — Z471 Aftercare following joint replacement surgery: Secondary | ICD-10-CM | POA: Diagnosis not present

## 2022-03-02 DIAGNOSIS — Z96651 Presence of right artificial knee joint: Secondary | ICD-10-CM | POA: Diagnosis not present

## 2022-03-02 DIAGNOSIS — Z471 Aftercare following joint replacement surgery: Secondary | ICD-10-CM | POA: Diagnosis not present

## 2022-03-02 DIAGNOSIS — M1711 Unilateral primary osteoarthritis, right knee: Secondary | ICD-10-CM | POA: Diagnosis not present

## 2022-03-04 DIAGNOSIS — Z96651 Presence of right artificial knee joint: Secondary | ICD-10-CM | POA: Diagnosis not present

## 2022-03-04 DIAGNOSIS — M1711 Unilateral primary osteoarthritis, right knee: Secondary | ICD-10-CM | POA: Diagnosis not present

## 2022-03-04 DIAGNOSIS — Z471 Aftercare following joint replacement surgery: Secondary | ICD-10-CM | POA: Diagnosis not present

## 2022-03-06 DIAGNOSIS — Z96651 Presence of right artificial knee joint: Secondary | ICD-10-CM | POA: Diagnosis not present

## 2022-03-06 DIAGNOSIS — Z471 Aftercare following joint replacement surgery: Secondary | ICD-10-CM | POA: Diagnosis not present

## 2022-03-06 DIAGNOSIS — M1711 Unilateral primary osteoarthritis, right knee: Secondary | ICD-10-CM | POA: Diagnosis not present

## 2022-03-09 DIAGNOSIS — Z471 Aftercare following joint replacement surgery: Secondary | ICD-10-CM | POA: Diagnosis not present

## 2022-03-09 DIAGNOSIS — M1711 Unilateral primary osteoarthritis, right knee: Secondary | ICD-10-CM | POA: Diagnosis not present

## 2022-03-09 DIAGNOSIS — Z96651 Presence of right artificial knee joint: Secondary | ICD-10-CM | POA: Diagnosis not present

## 2022-03-11 DIAGNOSIS — Z96651 Presence of right artificial knee joint: Secondary | ICD-10-CM | POA: Diagnosis not present

## 2022-03-11 DIAGNOSIS — M1711 Unilateral primary osteoarthritis, right knee: Secondary | ICD-10-CM | POA: Diagnosis not present

## 2022-03-11 DIAGNOSIS — Z471 Aftercare following joint replacement surgery: Secondary | ICD-10-CM | POA: Diagnosis not present

## 2022-03-13 ENCOUNTER — Encounter: Payer: 59 | Admitting: Registered Nurse

## 2022-03-13 DIAGNOSIS — Z471 Aftercare following joint replacement surgery: Secondary | ICD-10-CM | POA: Diagnosis not present

## 2022-03-13 DIAGNOSIS — Z96651 Presence of right artificial knee joint: Secondary | ICD-10-CM | POA: Diagnosis not present

## 2022-03-13 DIAGNOSIS — M1711 Unilateral primary osteoarthritis, right knee: Secondary | ICD-10-CM | POA: Diagnosis not present

## 2022-03-23 ENCOUNTER — Ambulatory Visit (INDEPENDENT_AMBULATORY_CARE_PROVIDER_SITE_OTHER): Payer: Medicare HMO | Admitting: Internal Medicine

## 2022-03-23 ENCOUNTER — Encounter: Payer: Self-pay | Admitting: Internal Medicine

## 2022-03-23 VITALS — BP 126/74 | HR 76 | Temp 98.1°F | Ht 67.0 in | Wt 170.0 lb

## 2022-03-23 DIAGNOSIS — Z125 Encounter for screening for malignant neoplasm of prostate: Secondary | ICD-10-CM | POA: Diagnosis not present

## 2022-03-23 DIAGNOSIS — E785 Hyperlipidemia, unspecified: Secondary | ICD-10-CM

## 2022-03-23 DIAGNOSIS — E663 Overweight: Secondary | ICD-10-CM

## 2022-03-23 DIAGNOSIS — F3181 Bipolar II disorder: Secondary | ICD-10-CM | POA: Diagnosis not present

## 2022-03-23 DIAGNOSIS — D696 Thrombocytopenia, unspecified: Secondary | ICD-10-CM

## 2022-03-23 DIAGNOSIS — Z96651 Presence of right artificial knee joint: Secondary | ICD-10-CM

## 2022-03-23 DIAGNOSIS — Z0000 Encounter for general adult medical examination without abnormal findings: Secondary | ICD-10-CM

## 2022-03-23 DIAGNOSIS — R7303 Prediabetes: Secondary | ICD-10-CM

## 2022-03-23 LAB — COMPREHENSIVE METABOLIC PANEL
ALT: 17 U/L (ref 0–53)
AST: 15 U/L (ref 0–37)
Albumin: 4.1 g/dL (ref 3.5–5.2)
Alkaline Phosphatase: 134 U/L — ABNORMAL HIGH (ref 39–117)
BUN: 16 mg/dL (ref 6–23)
CO2: 26 mEq/L (ref 19–32)
Calcium: 10 mg/dL (ref 8.4–10.5)
Chloride: 103 mEq/L (ref 96–112)
Creatinine, Ser: 1.23 mg/dL (ref 0.40–1.50)
GFR: 61.76 mL/min (ref >60.00)
Glucose, Bld: 101 mg/dL — ABNORMAL HIGH (ref 70–99)
Potassium: 4.1 mEq/L (ref 3.5–5.1)
Sodium: 139 mEq/L (ref 135–145)
Total Bilirubin: 0.4 mg/dL (ref 0.2–1.2)
Total Protein: 7.2 g/dL (ref 6.0–8.3)

## 2022-03-23 LAB — HEMOGLOBIN A1C: Hgb A1c MFr Bld: 6.1 % (ref 4.6–6.5)

## 2022-03-23 LAB — CBC
HCT: 44.4 % (ref 39.0–52.0)
Hemoglobin: 14.6 g/dL (ref 13.0–17.0)
MCHC: 32.8 g/dL (ref 30.0–36.0)
MCV: 86.7 fl (ref 78.0–100.0)
Platelets: 209 10*3/uL (ref 150.0–400.0)
RBC: 5.12 Mil/uL (ref 4.22–5.81)
RDW: 14.1 % (ref 11.5–15.5)
WBC: 5.3 10*3/uL (ref 4.0–10.5)

## 2022-03-23 LAB — LIPID PANEL
Cholesterol: 266 mg/dL — ABNORMAL HIGH (ref 0–200)
HDL: 45.2 mg/dL (ref >39.00)
LDL Cholesterol: 193 mg/dL — ABNORMAL HIGH (ref 0–99)
NonHDL: 220.64
Total CHOL/HDL Ratio: 6
Triglycerides: 140 mg/dL (ref 0.0–149.0)
VLDL: 28 mg/dL (ref 0.0–40.0)

## 2022-03-23 NOTE — Progress Notes (Signed)
Phone: [phone]   Chief Complaint:  Eric Crane is a 67 y.o. male who presents today for a Medicare Initial Annual Wellness Visit.    Subjective:  HPI:  Patient presents today for their Welcome to Medicare Exam - this visit was conducted by the physician.  Functional ability and level of safety Mobility assessment:  good, slight stiffness upon standing, 10 seconds total timed get up and go <12 seconds.  He is doing physical therapy still post knee replacement He has not had any falls within the past year.  Physical therapy for fall prevention offered but he is already in for knee replacement Activities of Daily Living- Independent in ADLs (toileting, bathing, dressing, transferring, food prep and eating) and in IADLs (shopping, housekeeping, managing own medications, and handling finances) Home Safety: Loose rugs (no), smoke detectors (yes), small pets (yes), grab bars (no), stairs (have handrails), life-alert system (uses iphone medication alert) Patient feels safe at home.  There are no smokers at home.  Hearing Difficulties: - patient has no concerns over hearing and is able to communicate verbally.  Hears finger rubs at 2 feet Fall Risk: low     03/23/2022    9:17 AM 11/20/2021    8:51 AM 03/10/2021    3:25 PM  Fall Risk   Falls in the past year? 0 0 0  Number falls in past yr: 0 0 0  Injury with Fall? 0 0 0  Risk for fall due to : No Fall Risks  No Fall Risks  Follow up Falls evaluation completed  Falls evaluation completed   Opioid use history:  no long term opioids use, used some preoperative. Low risk for addiction as he did not enjoy. Self assessment of health status: good - has lost 20 lbs intentional   Depression Screen/risk evaluation Risk factors: none.Marland Kitchen PHQ2 0     03/23/2022    9:53 AM 11/20/2021    8:51 AM 03/10/2021    3:26 PM  Depression screen PHQ 2/9  Decreased Interest 0 0 0  Down, Depressed, Hopeless 0 0 0  PHQ - 2 Score 0 0 0  Altered  sleeping   0  Tired, decreased energy   0  Change in appetite   0  Feeling bad or failure about yourself    0  Trouble concentrating   0  Moving slowly or fidgety/restless   0  Suicidal thoughts   0  PHQ-9 Score   0  Difficult doing work/chores   Not difficult at all     Cardiac risk factors:  advanced age (older than 5 for men, 22 for women) yes Hyperlipidemia yes: Lipid Panel     Component Value Date/Time   CHOL 266 (H) 03/23/2022 1026   TRIG 140.0 03/23/2022 1026   HDL 45.20 03/23/2022 1026   CHOLHDL 6 03/23/2022 1026   VLDL 28.0 03/23/2022 1026   LDLCALC 193 (H) 03/23/2022 1026   LDLCALC 175 (H) 03/11/2021 0812     Hypertension never had a problem No diabetes.had prediabetes Lab Results  Component Value Date   HGBA1C 6.1 03/23/2022   Family History:   Family History  Problem Relation Age of Onset   Heart disease Mother     Modifiable Risk Factors/behavioral risk assessment/psychosocial risk assessment Regular exercise: doing regular exercise with rowing machine Diet: cutting back and dieting with calorie control peanut butter and banana sandwich Wt Readings from Last 3 Encounters:  03/23/22 170 lb (77.1 kg)  02/02/22 175 lb (79.4 kg)  01/21/22  174 lb 11.2 oz (79.2 kg)   Smoking Status: Never Smoker (just a little 40 years ago) Second Hand Smoking status: No smokers in home Alcohol intake: 0 per week Other substance abuse/illicit drugs: none  Vision screen: 30/20 corrected today - following with eye doctor.  Advanced directives: he is currently completing elsewhere from hospital and will leave on file with me   Required Immunizations needed today:   Immunization History  Administered Date(s) Administered   Tdap 12/17/2019   Health Maintenance  Topic Date Due   Flu Shot  04/26/2022*   Medicare Annual Wellness Visit  03/24/2023   Colon Cancer Screening  02/17/2026   DTaP/Tdap/Td vaccine (2 - Td or Tdap) 12/16/2029   HPV Vaccine  Aged Out    Pneumonia Vaccine  Discontinued   COVID-19 Vaccine  Discontinued   Hepatitis C Screening: USPSTF Recommendation to screen - Ages 63-79 yo.  Discontinued   HIV Screening  Discontinued   Zoster (Shingles) Vaccine  Discontinued  *Topic was postponed. The date shown is not the original due date.  Declining hepatitis HIV and pneumonia shots- although they are covered and recommended.  I also suggested RSV vaccine but he is not interested.  Screening tests-  There are no preventive care reminders to display for this patient.  Colon cancer screening- not due til 2028, history polyps, following with [facility] Lung Cancer screening- no smoking Skin cancer screening:   Prostate cancer screening No results found for: "PSA" he will pay if he has to, no risk factors    The following were reviewed and entered/updated in epic:  Past Medical History:  Diagnosis Date   Bipolar 2 disorder ([place]) 2021-12-18   Depression    FH: heart disease 18-Dec-2021   Aunt died early 72s of some weird heart thing 3 aunts total died in 20s of heart problems Hasn't spoken to father since 1979 so doesn't know that history but knows they have high longevity.    Osteoarthritis of lower back 12/18/21   MRI 2021 Rarely ever hurts  T12- L1: Asymmetric leftward disc narrowing and bulging with endplate ridging. Asymmetric left facet spurring. Moderate left foraminal narrowing. Patent spinal canal. Bulky left far-lateral spur deforming the upper psoas.   L1-L2: Asymmetric leftward disc narrowing and endplate ridging with bulging. Moderate left foraminal narrowing. The canal is patent   L2-L3: Asymmet   Overweight 2021-12-18   Thrombocytopenia ([place]) December 18, 2021   Past Surgical History:  Procedure Laterality Date   COLONOSCOPY WITH PROPOFOL N/A 02/18/2016   Procedure: COLONOSCOPY WITH PROPOFOL;  Surgeon: Garlan Fair, MD;  Location: [facility];  Service: Endoscopy;  Laterality: N/A;   TONSILLECTOMY     removed as a  child   TOTAL KNEE ARTHROPLASTY Right 02/02/2022   Procedure: RIGHT TOTAL KNEE ARTHROPLASTY;  Surgeon: Leandrew Koyanagi, MD;  Location: [facility];  Service: Orthopedics;  Laterality: Right;   Family History  Problem Relation Age of Onset   Heart disease Mother     Medications- reviewed and updated Current Outpatient Medications  Medication Sig Dispense Refill   albuterol (VENTOLIN HFA) 108 (90 Base) MCG/ACT inhaler Inhale 2 puffs into the lungs every 6 (six) hours as needed for wheezing or shortness of breath. 8 g 2   amphetamine-dextroamphetamine (ADDERALL) 20 MG tablet Take 20 mg by mouth 3 (three) times daily.     aspirin EC 81 MG tablet Take 1 tablet (81 mg total) by mouth 2 (two) times daily. To be taken after surgery to  prevent blood clots 84 tablet 0   Coenzyme Q10 (CO Q 10) 100 MG CAPS Take 100 mg by mouth 3 (three) times a week.     DHEA 50 MG CAPS Take 100 mg by mouth 3 (three) times a week.     lamoTRIgine (LAMICTAL) 200 MG tablet Take 200 mg by mouth daily.     lurasidone (LATUDA) 40 MG TABS tablet Take 40 mg by mouth daily.     Misc Natural Products (GLUCOSAMINE CHOND MSM FORMULA PO) Take 1 tablet by mouth 3 (three) times a week.     Multiple Vitamins-Minerals (MULTIVITAMIN WITH MINERALS) tablet Take 1 tablet by mouth 3 (three) times a week.     QUEtiapine Fumarate (SEROQUEL XR) 150 MG 24 hr tablet Take 150 mg by mouth at bedtime.     No current facility-administered medications for this visit.    Allergies-reviewed and updated No Known Allergies  Social History   Socioeconomic History   Marital status: Divorced    Spouse name: Not on file   Number of children: 0   Years of education: Not on file   Highest education level: Not on file  Occupational History   Occupation: Advertising copywriter  Tobacco Use   Smoking status: Never   Smokeless tobacco: Never  Vaping Use   Vaping Use: Never used  Substance and Sexual Activity   Alcohol use: No   Drug use: No   Sexual  activity: Not on file  Other Topics Concern   Not on file  Social History Narrative   Not on file   Social Determinants of Health   Financial Resource Strain: Not on file  Food Insecurity: No Food Insecurity (02/02/2022)   Hunger Vital Sign    Worried About Running Out of Food in the Last Year: Never true    Ran Out of Food in the Last Year: Never true  Transportation Needs: No Transportation Needs (02/02/2022)   PRAPARE - Hydrologist (Medical): No    Lack of Transportation (Non-Medical): No  Physical Activity: Not on file  Stress: Not on file  Social Connections: Not on file    Patient Care Team: Loralee Pacas, MD as PCP - General (Internal Medicine) Leandrew Koyanagi, MD as Attending Physician (Orthopedic Surgery) Chucky May, MD as Consulting Physician (Psychiatry)        ROS:  Review of Systems  Constitutional:  Negative for chills, diaphoresis, fever, malaise/fatigue and weight loss.  HENT:  Negative for congestion, ear discharge, ear pain, hearing loss, nosebleeds, sinus pain, sore throat and tinnitus.   Eyes:  Negative for blurred vision, double vision, photophobia, pain, discharge and redness.  Respiratory:  Negative for cough, hemoptysis, sputum production, shortness of breath, wheezing and stridor.   Cardiovascular:  Negative for chest pain, palpitations, orthopnea, claudication, leg swelling and PND.  Gastrointestinal:  Negative for abdominal pain, blood in stool, constipation, diarrhea, heartburn, melena, nausea and vomiting.  Genitourinary:  Positive for frequency. Negative for dysuria, flank pain, hematuria and urgency.  Musculoskeletal:  Negative for back pain, falls, joint pain, myalgias and neck pain.  Skin:  Negative for itching and rash.  Neurological:  Negative for dizziness, tingling, tremors, sensory change, speech change, focal weakness, seizures, loss of consciousness, weakness and headaches.  Endo/Heme/Allergies:  Negative  for environmental allergies and polydipsia. Does not bruise/bleed easily.  Psychiatric/Behavioral:  Negative for depression, hallucinations, memory loss, substance abuse and suicidal ideas. The patient is not nervous/anxious and does not have  insomnia.       Objective/Observations  Physical Exam: BP 126/74 (BP Location: Left Arm, Patient Position: Sitting)   Pulse 76   Temp 98.1 F (36.7 C) (Temporal)   Ht '5\' 7"'$  (1.702 m)   Wt 170 lb (77.1 kg)   SpO2 98%   BMI 26.63 kg/m  30/20 Vision screening / Hearing Screening hears finger rubs at 2 feet    03/23/2022    9:17 AM 11/20/2021    8:51 AM 03/10/2021    3:25 PM  Fall Risk   Falls in the past year? 0 0 0  Number falls in past yr: 0 0 0  Injury with Fall? 0 0 0  Risk for fall due to : No Fall Risks  No Fall Risks  Follow up Falls evaluation completed  Falls evaluation completed   No results found. Gen: NAD, resting comfortably CV: RRR with no murmurs appreciated Pulm: NWOB, CTAB with no crackles, wheezes, or rhonchi GI: Normal bowel sounds present. Soft, Nontender, Nondistended. MSK: no edema, cyanosis, or clubbing noted Skin: warm, dry Neuro: grossly normal, moves all extremities Psych: Normal affect and thought content. Normal minicog.      EKG declined - he had just last month  Mini-Cog - 03/23/22 1016     Normal clock drawing test? yes    How many words correct? 2             Assessment and Plan:   Patient Counseling(The following topics were reviewed and/or handout was given):  -Nutrition: Stressed importance of moderation in sodium/caffeine intake, saturated fat and cholesterol, caloric balance, sufficient intake of fresh fruits, vegetables, and fiber.  -Stressed the importance of regular exercise.   -Substance Abuse: Discussed cessation/primary prevention of tobacco, alcohol, or other drug use; driving or other dangerous activities under the influence; availability of treatment for abuse.   -Injury  prevention: Discussed safety belts, safety helmets, smoke detector, smoking near bedding or upholstery.   -Sexuality: Discussed sexually transmitted diseases, partner selection, use of condoms, avoidance of unintended pregnancy and contraceptive alternatives.   -Dental health: Discussed importance of regular tooth brushing, flossing, and dental visits.  -Health maintenance and immunizations reviewed. Please refer to Health maintenance section.  Advance Directives were discussed with the patient.   Patient Instructions (the written plan) was given to the patient by being placed in [place].  Medicare Attestation I have personally reviewed: The patient's medical and social history Their use of alcohol, tobacco or illicit drugs I attest that I have reviewed and confirmed the patients current medications to meet the medication reconciliation requirement  The patient's functional ability including ADLs,fall risks, home safety risks, cognitive, and hearing and visual impairment Diet and physical activities Evidence for depression or mood disorders His use of opioid medications, reviewed Opioid use disorder risk factors, evaluated pain severity and current treatment plan, and provided information on non-opioid treatment options   The patient's weight, height, BMI, and visual acuity have been recorded in the chart.  I have made referrals, counseling, and provided education to the patient based on review of the above and I have provided the patient with a written personalized care plan for preventive services.    During the course of the visit the patient was educated and counseled about appropriate screening and preventive services including:        Pneumococcal vaccine  Screening electrocardiogram Nutrition counseling  Advanced directives: has an advanced directive - a copy has been provided      Recommended follow  up:  Future Appointments  Date Time Provider [place]  03/24/2022   1:15 PM Leandrew Koyanagi, MD [facility] None  03/31/2022 10:20 AM Loralee Pacas, MD [facility]  03/25/2023  8:20 AM Loralee Pacas, MD [facility]     Lab/Order associations:   ICD-10-CM   1. Encounter for initial annual wellness visit (AWV) in Medicare patient  Z00.00 CBC    Comprehensive metabolic panel    CANCELED: CBC    CANCELED: Comprehensive metabolic panel    2. Hyperlipidemia, acquired  E78.5 Hemoglobin A1c    Lipid panel    CANCELED: Lipid panel    CANCELED: Hemoglobin A1c    3. Overweight  E66.3 Hemoglobin A1c    Lipid panel    CANCELED: Lipid panel    CANCELED: Hemoglobin A1c    4. Preventative health care  Z00.00     5. Prediabetes  R73.03     6. Bipolar 2 disorder (HCC) Chronic F31.81     7. Thrombocytopenia (HCC) Chronic D69.6     8. Screening for malignant neoplasm of prostate  Z12.5 PSA    9. History of total right knee replacement  Z96.651         Loralee Pacas, MD

## 2022-03-24 ENCOUNTER — Ambulatory Visit (INDEPENDENT_AMBULATORY_CARE_PROVIDER_SITE_OTHER): Payer: Medicare HMO

## 2022-03-24 ENCOUNTER — Encounter: Payer: Self-pay | Admitting: Orthopaedic Surgery

## 2022-03-24 ENCOUNTER — Ambulatory Visit (INDEPENDENT_AMBULATORY_CARE_PROVIDER_SITE_OTHER): Payer: Medicare HMO | Admitting: Orthopaedic Surgery

## 2022-03-24 ENCOUNTER — Encounter: Payer: Medicare HMO | Admitting: Orthopaedic Surgery

## 2022-03-24 DIAGNOSIS — M1711 Unilateral primary osteoarthritis, right knee: Secondary | ICD-10-CM | POA: Diagnosis not present

## 2022-03-24 DIAGNOSIS — Z96651 Presence of right artificial knee joint: Secondary | ICD-10-CM

## 2022-03-24 DIAGNOSIS — Z471 Aftercare following joint replacement surgery: Secondary | ICD-10-CM | POA: Diagnosis not present

## 2022-03-24 NOTE — Progress Notes (Signed)
Post-Op Visit Note   Patient: Eric Crane           Date of Birth: 02-Jul-1955           MRN: [id number] Visit Date: 03/24/2022 PCP: Loralee Pacas, MD   Assessment & Plan:  Chief Complaint:  Chief Complaint  Patient presents with   Right Knee - Follow-up    Right total knee arthroplasty 02/02/2022   Visit Diagnoses:  1. Status post total right knee replacement     Plan: Patient is a pleasant 67 year old gentleman who comes in today 7 weeks status post right total knee replacement 02/02/2022.  He has been doing well.  He does note some discomfort when he has been standing for a while as well as when he goes from a seated to standing position where he lifts the first few steps from stiffness.  He is taking Tylenol for pain.  He has been taking a baby aspirin twice daily for DVT prophylaxis.  He is in physical therapy 3 days a week and working on a home exercise program.  Examination of the right knee reveals a fully healed surgical scar without complication.  Range of motion 0 to 110 degrees.  He is stable to valgus varus stress.  He is neurovascularly intact distally.  At this point, he may discontinue his baby aspirin.  He will continue to advance with activity as tolerated.  Continue PT as well as a home exercise program.  Dental prophylaxis reinforced.  Follow-up in 5 weeks when he is 12 weeks out from surgery.  Call with concerns or questions in the meantime.  Follow-Up Instructions: Return in about 5 weeks (around 04/28/2022).   Orders:  Orders Placed This Encounter  Procedures   XR Knee 1-2 Views Right   No orders of the defined types were placed in this encounter.   Imaging: XR Knee 1-2 Views Right  Result Date: 03/24/2022 Well-seated prosthesis without complication   PMFS History: Patient Active Problem List   Diagnosis Date Noted   Prediabetes 03/23/2022   H/O total knee replacement 02/02/2022   Overweight 28-Nov-2021   FH: heart disease 11-28-2021    Osteoarthritis of lower back 11/28/21   Hyperlipidemia, acquired 2021-11-28   Thrombocytopenia ([place]) 11-28-21   Attention deficit hyperactivity disorder (ADHD) 11/28/2021   Bipolar 2 disorder ([place] ) 2021/11/28   Primary osteoarthritis of right knee 04/07/2021   Past Medical History:  Diagnosis Date   Bipolar 2 disorder ([place]) 11/28/21   Depression    FH: heart disease 2021-11-28   Aunt died early 87s of some weird heart thing 3 aunts total died in 61s of heart problems Hasn't spoken to father since 1979 so doesn't know that history but knows they have high longevity.    Osteoarthritis of lower back 11-28-21   MRI 2021 Rarely ever hurts  T12- L1: Asymmetric leftward disc narrowing and bulging with endplate ridging. Asymmetric left facet spurring. Moderate left foraminal narrowing. Patent spinal canal. Bulky left far-lateral spur deforming the upper psoas.   L1-L2: Asymmetric leftward disc narrowing and endplate ridging with bulging. Moderate left foraminal narrowing. The canal is patent   L2-L3: Asymmet   Overweight Nov 28, 2021   Thrombocytopenia ([place]) 28-Nov-2021    Family History  Problem Relation Age of Onset   Heart disease Mother     Past Surgical History:  Procedure Laterality Date   COLONOSCOPY WITH PROPOFOL N/A 02/18/2016   Procedure: COLONOSCOPY WITH PROPOFOL;  Surgeon: Garlan Fair, MD;  Location: [facility];  Service: Endoscopy;  Laterality: N/A;   TONSILLECTOMY     removed as a child   TOTAL KNEE ARTHROPLASTY Right 02/02/2022   Procedure: RIGHT TOTAL KNEE ARTHROPLASTY;  Surgeon: Leandrew Koyanagi, MD;  Location: [place];  Service: Orthopedics;  Laterality: Right;   Social History   Occupational History   Occupation: Advertising copywriter  Tobacco Use   Smoking status: Never   Smokeless tobacco: Never  Vaping Use   Vaping Use: Never used  Substance and Sexual Activity   Alcohol use: No   Drug use: No   Sexual activity: Not on file

## 2022-03-25 ENCOUNTER — Telehealth: Payer: Self-pay | Admitting: Internal Medicine

## 2022-03-25 NOTE — Telephone Encounter (Signed)
Patient requests to be called to discuss Lab results that he received on MyChart (states he is concerned).

## 2022-03-26 ENCOUNTER — Telehealth: Payer: Self-pay | Admitting: Orthopaedic Surgery

## 2022-03-26 NOTE — Telephone Encounter (Signed)
Patient is s/p right total knee arthroplasty 02/02/2022. Please advise if okay to return back to work with no restrictions.

## 2022-03-26 NOTE — Telephone Encounter (Signed)
Pt called requesting a return to work dated for 04/20/22 with no restrictions. Pt is asking for it to be sent to [facility] and also want to pick up a copy. Please call pt when ready for pick up at [phone].

## 2022-03-27 NOTE — Telephone Encounter (Signed)
Notified patient.

## 2022-03-27 NOTE — Telephone Encounter (Signed)
yes

## 2022-03-31 ENCOUNTER — Ambulatory Visit (INDEPENDENT_AMBULATORY_CARE_PROVIDER_SITE_OTHER): Payer: Medicare HMO | Admitting: Internal Medicine

## 2022-03-31 ENCOUNTER — Encounter: Payer: Self-pay | Admitting: Internal Medicine

## 2022-03-31 VITALS — BP 134/68 | HR 65 | Temp 98.6°F | Ht 67.0 in | Wt 172.2 lb

## 2022-03-31 DIAGNOSIS — R3915 Urgency of urination: Secondary | ICD-10-CM | POA: Diagnosis not present

## 2022-03-31 DIAGNOSIS — E785 Hyperlipidemia, unspecified: Secondary | ICD-10-CM

## 2022-03-31 DIAGNOSIS — H6123 Impacted cerumen, bilateral: Secondary | ICD-10-CM

## 2022-03-31 LAB — PSA: PSA: 1.11 ng/mL (ref 0.10–4.00)

## 2022-03-31 MED ORDER — ROSUVASTATIN CALCIUM 20 MG PO TABS: 20.0000 mg | ORAL_TABLET | Freq: Every day | ORAL | 3 refills | Status: DC

## 2022-03-31 NOTE — Progress Notes (Signed)
[name]: [id number]   Routine Medical Office Visit  Patient:  Eric Crane      Age: 67 y.o.       Sex:  male  Date:   03/31/2022  PCP:    Loralee Pacas, [place] Provider: Loralee Pacas, MD   Assessment and Plan:   Devendra was seen today for ear irrigation and hyperlipidemia.  Hyperlipidemia, acquired Overview: Lipid Panel     Component Value Date/Time   CHOL 248 (H) 03/11/2021 0812   TRIG 145 03/11/2021 0812   HDL 44 03/11/2021 0812   CHOLHDL 5.6 (H) 03/11/2021 0812   LDLCALC 175 (H) 03/11/2021 0812   On 11/20/21 The 10-year ASCVD risk score (Arnett DK, et al., 2019) is: 16.8%   Values used to calculate the score:     Age: 85 years     Sex: Male     Is Non-Hispanic African American: No     Diabetic: No     Tobacco smoker: No     Systolic Blood Pressure: 0000000 mmHg     Is BP treated: No     HDL Cholesterol: 44 mg/dL     Total Cholesterol: 248 mg/dL   Orders: -     Rosuvastatin Calcium; Take 1 tablet (20 mg total) by mouth daily.  Dispense: 90 tablet; Refill: 3  Urinary urgency -     PSA   reordered PSA due to prior order is incomplete for unclear reason, he will check with lab if it needs redrawn.          Clinical Presentation:   The patient is a 67 y.o. male: Active Ambulatory Problems    Diagnosis Date Noted   Primary osteoarthritis of right knee 04/07/2021   Overweight 11/20/2021   FH: heart disease 11/20/2021   Osteoarthritis of lower back 11/20/2021   Hyperlipidemia, acquired 11/20/2021   Thrombocytopenia ([place]) 11/20/2021   Attention deficit hyperactivity disorder (ADHD) 11/20/2021   Bipolar 2 disorder ([place]) 11/20/2021   H/O total knee replacement 02/02/2022   Prediabetes 03/23/2022   Resolved Ambulatory Problems    Diagnosis Date Noted   Primary osteoarthritis of right knee 08/12/2021   Past Medical History:  Diagnosis Date   Depression     Outpatient Medications Prior to Visit   Medication Sig   albuterol (VENTOLIN HFA) 108 (90 Base) MCG/ACT inhaler Inhale 2 puffs into the lungs every 6 (six) hours as needed for wheezing or shortness of breath.   amphetamine-dextroamphetamine (ADDERALL) 20 MG tablet Take 20 mg by mouth 3 (three) times daily.   Coenzyme Q10 (CO Q 10) 100 MG CAPS Take 100 mg by mouth 3 (three) times a week.   DHEA 50 MG CAPS Take 100 mg by mouth 3 (three) times a week.   lamoTRIgine (LAMICTAL) 200 MG tablet Take 200 mg by mouth daily.   Lurasidone HCl 60 MG TABS Take 1 tablet by mouth daily.   Misc Natural Products (GLUCOSAMINE CHOND MSM FORMULA PO) Take 1 tablet by mouth 3 (three) times a week.   Multiple Vitamins-Minerals (MULTIVITAMIN WITH MINERALS) tablet Take 1 tablet by mouth 3 (three) times a week.   QUEtiapine (SEROQUEL) 100 MG tablet Take 100 mg by mouth at bedtime.   [DISCONTINUED] aspirin EC 81 MG tablet Take 1 tablet (81 mg total) by mouth 2 (two) times daily. To be taken after surgery to prevent blood clots   [DISCONTINUED] lurasidone (LATUDA) 40 MG TABS tablet Take 40 mg by mouth  daily.   [DISCONTINUED] QUEtiapine Fumarate (SEROQUEL XR) 150 MG 24 hr tablet Take 100 mg by mouth at bedtime.   No facility-administered medications prior to visit.     Chief Complaint  Patient presents with   Ear irrigation   Hyperlipidemia    HPI     HPI: [name] is a 67 y.o. male here for follow up of dyslipidemia and clogged ears. The patient does not use medications that may worsen dyslipidemias (corticosteroids, progestins, anabolic steroids, diuretics, beta-blockers, amiodarone, cyclosporine, olanzapine). The patient is not known to have coexisting coronary artery disease.            Clinical Data Analysis:   BP 134/68 (BP Location: Left Arm, Patient Position: Sitting)   Pulse 65   Temp 98.6 F (37 C) (Temporal)   Ht '5\' 7"'$  (1.702 m)   Wt 172 lb 3.2 oz (78.1 kg)   SpO2 96%   BMI 26.97 kg/m  Wt Readings from Last 10 Encounters:   03/31/22 172 lb 3.2 oz (78.1 kg)  03/23/22 170 lb (77.1 kg)  02/02/22 175 lb (79.4 kg)  01/21/22 174 lb 11.2 oz (79.2 kg)  11/20/21 185 lb 12.8 oz (84.3 kg)  04/07/21 185 lb (83.9 kg)  03/10/21 185 lb 9.6 oz (84.2 kg)  12/17/19 175 lb (79.4 kg)  10/03/19 183 lb (83 kg)  07/14/19 183 lb (83 kg)   Vital signs reviewed.  Nursing notes reviewed. Weight trend reviewed. Abnormalities noted: Body mass index is 26.97 kg/m. , weight is stable.  Physical Exam: Vital signs reviewed.  Nursing notes reviewed. General Appearance/Constitutional:   Overweight male in no acute distress Musculoskeletal: All extremities are intact.  Neurological:  Awake, alert,  No obvious focal neurological deficits or cognitive impairments Psychiatric:  Appropriate mood, pleasant demeanor Problem-specific findings:  ear is clogged bilaterally until procedure completed.Marland Kitchen   Results Reviewed: (reviewed labs/imaging may be also be found in the assessment / plan section):  Results for orders placed or performed in visit on 03/31/22  PSA  Result Value Ref Range   PSA 1.11 0.10 - 4.00 ng/mL    Recent Results (from the past 2160 hour(s))  Surgical pcr screen     Status: None   Collection Time: 01/21/22  8:41 AM   Specimen: Nasal Mucosa; Nasal Swab  Result Value Ref Range   MRSA, PCR NEGATIVE NEGATIVE   Staphylococcus aureus NEGATIVE NEGATIVE    Comment: (NOTE) The Xpert SA Assay (FDA approved for NASAL specimens in patients 91 years of age and older), is one component of a comprehensive surveillance program. It is not intended to diagnose infection nor to guide or monitor treatment. Performed at [facility], [place] [address], [place] [address]   CBC     Status: None   Collection Time: 01/21/22  8:55 AM  Result Value Ref Range   WBC 6.5 4.0 - 10.5 K/uL   RBC 5.53 4.22 - 5.81 MIL/uL   Hemoglobin 16.2 13.0 - 17.0 g/dL   HCT 47.8 39.0 - 52.0 %   MCV 86.4 80.0 - 100.0 fL   MCH 29.3 26.0 - 34.0  pg   MCHC 33.9 30.0 - 36.0 g/dL   RDW 12.9 11.5 - 15.5 %   Platelets 206 150 - 400 K/uL   nRBC 0.0 0.0 - 0.2 %    Comment: Performed at [facility], [place] [address], [place], [place] [id number]  Basic metabolic panel     Status: Abnormal  Collection Time: 01/21/22  8:55 AM  Result Value Ref Range   Sodium 136 135 - 145 mmol/L   Potassium 3.7 3.5 - 5.1 mmol/L   Chloride 101 98 - 111 mmol/L   CO2 24 22 - 32 mmol/L   Glucose, Bld 117 (H) 70 - 99 mg/dL    Comment: Glucose reference range applies only to samples taken after fasting for at least 8 hours.   BUN 14 8 - 23 mg/dL   Creatinine, Ser 1.47 (H) 0.61 - 1.24 mg/dL   Calcium 9.3 8.9 - 10.3 mg/dL   GFR, Estimated 53 (L) >60 mL/min    Comment: (NOTE) Calculated using the CKD-EPI Creatinine Equation (2021)    Anion gap 11 5 - 15    Comment: Performed at [facility] [address] [place], [place] [address]  CBC     Status: Abnormal   Collection Time: 02/03/22  3:27 AM  Result Value Ref Range   WBC 12.1 (H) 4.0 - 10.5 K/uL   RBC 4.68 4.22 - 5.81 MIL/uL   Hemoglobin 13.7 13.0 - 17.0 g/dL   HCT 40.4 39.0 - 52.0 %   MCV 86.3 80.0 - 100.0 fL   MCH 29.3 26.0 - 34.0 pg   MCHC 33.9 30.0 - 36.0 g/dL   RDW 13.2 11.5 - 15.5 %   Platelets 158 150 - 400 K/uL   nRBC 0.0 0.0 - 0.2 %    Comment: Performed at [facility], [place] [address] [place], [place] [address]  Glucose, capillary     Status: Abnormal   Collection Time: 02/03/22  6:30 AM  Result Value Ref Range   Glucose-Capillary 144 (H) 70 - 99 mg/dL    Comment: Glucose reference range applies only to samples taken after fasting for at least 8 hours.  CBC     Status: None   Collection Time: 03/23/22 10:26 AM  Result Value Ref Range   WBC 5.3 4.0 - 10.5 K/uL   RBC 5.12 4.22 - 5.81 Mil/uL   Platelets 209.0 150.0 - 400.0 K/uL   Hemoglobin 14.6 13.0 - 17.0 g/dL   HCT 44.4 39.0 - 52.0 %   MCV 86.7 78.0 - 100.0 fl   MCHC 32.8 30.0 - 36.0 g/dL   RDW 14.1 11.5 -  15.5 %  Comprehensive metabolic panel     Status: Abnormal   Collection Time: 03/23/22 10:26 AM  Result Value Ref Range   Sodium 139 135 - 145 mEq/L   Potassium 4.1 3.5 - 5.1 mEq/L   Chloride 103 96 - 112 mEq/L   CO2 26 19 - 32 mEq/L   Glucose, Bld 101 (H) 70 - 99 mg/dL   BUN 16 6 - 23 mg/dL   Creatinine, Ser 1.23 0.40 - 1.50 mg/dL   Total Bilirubin 0.4 0.2 - 1.2 mg/dL   Alkaline Phosphatase 134 (H) 39 - 117 U/L   AST 15 0 - 37 U/L   ALT 17 0 - 53 U/L   Total Protein 7.2 6.0 - 8.3 g/dL   Albumin 4.1 3.5 - 5.2 g/dL   GFR 61.76 >60.00 mL/min    Comment: Calculated using the CKD-EPI Creatinine Equation (2021)   Calcium 10.0 8.4 - 10.5 mg/dL  Hemoglobin A1c     Status: None   Collection Time: 03/23/22 10:26 AM  Result Value Ref Range   Hgb A1c MFr Bld 6.1 4.6 - 6.5 %    Comment: Glycemic Control Guidelines for People with Diabetes:Non Diabetic:  <  6%Goal of Therapy: <7%Additional Action Suggested:  >8%   Lipid panel     Status: Abnormal   Collection Time: 03/23/22 10:26 AM  Result Value Ref Range   Cholesterol 266 (H) 0 - 200 mg/dL    Comment: ATP III Classification       Desirable:  < 200 mg/dL               Borderline High:  200 - 239 mg/dL          High:  > = 240 mg/dL   Triglycerides 140.0 0.0 - 149.0 mg/dL    Comment: Normal:  <150 mg/dLBorderline High:  150 - 199 mg/dL   HDL 45.20 >39.00 mg/dL   VLDL 28.0 0.0 - 40.0 mg/dL   LDL Cholesterol 193 (H) 0 - 99 mg/dL   Total CHOL/HDL Ratio 6     Comment:                Men          Women1/2 Average Risk     3.4          3.3Average Risk          5.0          4.42X Average Risk          9.6          7.13X Average Risk          15.0          11.0                       NonHDL 220.64     Comment: NOTE:  Non-HDL goal should be 30 mg/dL higher than patient's LDL goal (i.e. LDL goal of < 70 mg/dL, would have non-HDL goal of < 100 mg/dL)  PSA     Status: None   Collection Time: 03/31/22 11:30 AM  Result Value Ref Range   PSA 1.11 0.10 -  4.00 ng/mL    Comment: Test performed using Access Hybritech PSA Assay, a parmagnetic partical, chemiluminecent immunoassay.     Ceruminosis is noted.  Wax is removed by syringing and manual debridement. Instructions for home care to prevent wax buildup are given.   PROCEDURE: CERUMEN DISIMPACTION   Otoscopic viewing of the tympanic membrane was initially obstructed by copious impacted cerumen in the external auditory canal, so disimpaction by irrigation was recommended.  The associated risk of tympanic membrane perforation was discussed and verbal consent was obtained prior to performing the procedure. The affected  bilateral  auditory canal(s) were then irrigated by gentle ear lavage.   Unfortunately, cerumen that was extremely hard, dry, irritating and adherent to the external auditory canal could not be dislodged by the irrigation, and threatened to cause symptoms such as pain, itching and/or hearing loss.  This situation required an MD's skill to remove with magnification and instrumentation.  The still affected bilateral auditory canal(s) were then mechanically disimpacted by Loralee Pacas using magnified video-guided otoscopic curettage, taking great care to minimize pain.   Removal of the impacted cerumen was confirmed by real-time video otoscopy during curettage.   Post procedurally,  the tympanic membranes was re-examined to confirm it was still intact.    Patient tolerated well without significant complications, mild pain briefly during left ear canal curettage.    The patient reported some relief of symptoms after the procedure.  The patient reported satisfaction with the procedure as they were able to  watch video of the performance.

## 2022-03-31 NOTE — Patient Instructions (Signed)
High Cholesterol  High cholesterol is a condition in which the blood has high levels of a white, waxy substance similar to fat (cholesterol). The liver makes all the cholesterol that the body needs. The human body needs small amounts of cholesterol to help build cells. A person gets extra or excess cholesterol from the food that he or she eats. The blood carries cholesterol from the liver to the rest of the body. If you have high cholesterol, deposits (plaques) may build up on the walls of your arteries. Arteries are the blood vessels that carry blood away from your heart. These plaques make the arteries narrow and stiff. Cholesterol plaques increase your risk for heart attack and stroke. Work with your health care provider to keep your cholesterol levels in a healthy range. What increases the risk? The following factors may make you more likely to develop this condition: Eating foods that are high in animal fat (saturated fat) or cholesterol. Being overweight. Not getting enough exercise. A family history of high cholesterol (familial hypercholesterolemia). Use of tobacco products. Having diabetes. What are the signs or symptoms? In most cases, high cholesterol does not usually cause any symptoms. In severe cases, very high cholesterol levels can cause: Fatty bumps under the skin (xanthomas). A white or gray ring around the black center (pupil) of the eye. How is this diagnosed? This condition may be diagnosed based on the results of a blood test. If you are older than 66 years of age, your health care provider may check your cholesterol levels every 4-6 years. You may be checked more often if you have high cholesterol or other risk factors for heart disease. The blood test for cholesterol measures: "Bad" cholesterol, or LDL cholesterol. This is the main type of cholesterol that causes heart disease. The desired level is less than 100 mg/dL (2.59 mmol/L). "Good" cholesterol, or HDL  cholesterol. HDL helps protect against heart disease by cleaning the arteries and carrying the LDL to the liver for processing. The desired level for HDL is 60 mg/dL (1.55 mmol/L) or higher. Triglycerides. These are fats that your body can store or burn for energy. The desired level is less than 150 mg/dL (1.69 mmol/L). Total cholesterol. This measures the total amount of cholesterol in your blood and includes LDL, HDL, and triglycerides. The desired level is less than 200 mg/dL (5.17 mmol/L). How is this treated? Treatment for high cholesterol starts with lifestyle changes, such as diet and exercise. Diet changes. You may be asked to eat foods that have more fiber and less saturated fats or added sugar. Lifestyle changes. These may include regular exercise, maintaining a healthy weight, and quitting use of tobacco products. Medicines. These are given when diet and lifestyle changes have not worked. You may be prescribed a statin medicine to help lower your cholesterol levels. Follow these instructions at home: Eating and drinking  Eat a healthy, balanced diet. This diet includes: Daily servings of a variety of fresh, frozen, or canned fruits and vegetables. Daily servings of whole grain foods that are rich in fiber. Foods that are low in saturated fats and trans fats. These include poultry and fish without skin, lean cuts of meat, and low-fat dairy products. A variety of fish, especially oily fish that contain omega-3 fatty acids. Aim to eat fish at least 2 times a week. Avoid foods and drinks that have added sugar. Use healthy cooking methods, such as roasting, grilling, broiling, baking, poaching, steaming, and stir-frying. Do not fry your food except for   stir-frying. If you drink alcohol: Limit how much you have to: 0-1 drink a day for women who are not pregnant. 0-2 drinks a day for men. Know how much alcohol is in a drink. In the [place] one drink equals one 12 oz bottle of beer (355 mL),  one 5 oz glass of wine (148 mL), or one 1 oz glass of hard liquor (44 mL). Lifestyle  Get regular exercise. Aim to exercise for a total of 150 minutes a week. Increase your activity level by doing activities such as gardening, walking, and taking the stairs. Do not use any products that contain nicotine or tobacco. These products include cigarettes, chewing tobacco, and vaping devices, such as e-cigarettes. If you need help quitting, ask your health care provider. General instructions Take over-the-counter and prescription medicines only as told by your health care provider. Keep all follow-up visits. This is important. Where to find more information American Heart Association: www.heart.org National Heart, Lung, and Blood Institute: https://wilson-eaton.com/ Contact a health care provider if: You have trouble achieving or maintaining a healthy diet or weight. You are starting an exercise program. You are unable to stop smoking. Get help right away if: You have chest pain. You have trouble breathing. You have discomfort or pain in your jaw, neck, back, shoulder, or arm. You have any symptoms of a stroke. "BE FAST" is an easy way to remember the main warning signs of a stroke: B - Balance. Signs are dizziness, sudden trouble walking, or loss of balance. E - Eyes. Signs are trouble seeing or a sudden change in vision. F - Face. Signs are sudden weakness or numbness of the face, or the face or eyelid drooping on one side. A - Arms. Signs are weakness or numbness in an arm. This happens suddenly and usually on one side of the body. S - Speech. Signs are sudden trouble speaking, slurred speech, or trouble understanding what people say. T - Time. Time to call emergency services. Write down what time symptoms started. You have other signs of a stroke, such as: A sudden, severe headache with no known cause. Nausea or vomiting. Seizure. These symptoms may represent a serious problem that is an  emergency. Do not wait to see if the symptoms will go away. Get medical help right away. Call your local emergency services (911 in the [place] Do not drive yourself to the hospital. Summary Cholesterol plaques increase your risk for heart attack and stroke. Work with your health care provider to keep your cholesterol levels in a healthy range. Eat a healthy, balanced diet, get regular exercise, and maintain a healthy weight. Do not use any products that contain nicotine or tobacco. These products include cigarettes, chewing tobacco, and vaping devices, such as e-cigarettes. Get help right away if you have any symptoms of a stroke. This information is not intended to replace advice given to you by your health care provider. Make sure you discuss any questions you have with your health care provider. Document Revised: 08/15/2021 Document Reviewed: 03/18/2020 Elsevier Patient Education  [place].     Why follow it? Research shows. Those who follow the Mediterranean diet have a reduced risk of heart disease  The diet is associated with a reduced incidence of Parkinson's and Alzheimer's diseases People following the diet may have longer life expectancies and lower rates of chronic diseases  The Dietary Guidelines for Americans recommends the Mediterranean diet as an eating plan to promote health and prevent disease  What Is the  Mediterranean Diet?  Healthy eating plan based on typical foods and recipes of Mediterranean-style cooking The diet is primarily a plant based diet; these foods should make up a majority of meals   Starches - Plant based foods should make up a majority of meals - They are an important sources of vitamins, minerals, energy, antioxidants, and fiber - Choose whole grains, foods high in fiber and minimally processed items  - Typical grain sources include wheat, oats, barley, corn, brown rice, bulgar, farro, millet, polenta, couscous  - Various types of beans include  chickpeas, lentils, fava beans, black beans, white beans   Fruits  Veggies - Large quantities of antioxidant rich fruits & veggies; 6 or more servings  - Vegetables can be eaten raw or lightly drizzled with oil and cooked  - Vegetables common to the traditional Mediterranean Diet include: artichokes, arugula, beets, broccoli, brussel sprouts, cabbage, carrots, celery, collard greens, cucumbers, eggplant, kale, leeks, lemons, lettuce, mushrooms, okra, onions, peas, peppers, potatoes, pumpkin, radishes, rutabaga, shallots, spinach, sweet potatoes, turnips, zucchini - Fruits common to the Mediterranean Diet include: apples, apricots, avocados, cherries, clementines, dates, figs, grapefruits, grapes, melons, nectarines, oranges, peaches, pears, pomegranates, strawberries, tangerines  Fats - Replace butter and margarine with healthy oils, such as olive oil, canola oil, and tahini  - Limit nuts to no more than a handful a day  - Nuts include walnuts, almonds, pecans, pistachios, pine nuts  - Limit or avoid candied, honey roasted or heavily salted nuts - Olives are central to the [employer] - can be eaten whole or used in a variety of dishes   Meats Protein - Limiting red meat: no more than a few times a month - When eating red meat: choose lean cuts and keep the portion to the size of deck of cards - Eggs: approx. 0 to 4 times a week  - Fish and lean poultry: at least 2 a week  - Healthy protein sources include, chicken, [place], lean beef, lamb - Increase intake of seafood such as tuna, salmon, trout, mackerel, shrimp, scallops - Avoid or limit high fat processed meats such as sausage and bacon  Dairy - Include moderate amounts of low fat dairy products  - Focus on healthy dairy such as fat free yogurt, skim milk, low or reduced fat cheese - Limit dairy products higher in fat such as whole or 2% milk, cheese, ice cream  Alcohol - Moderate amounts of red wine is ok  - No more than 5 oz daily  for women (all ages) and men older than age 66  - No more than 10 oz of wine daily for men younger than 64  Other - Limit sweets and other desserts  - Use herbs and spices instead of salt to flavor foods  - Herbs and spices common to the traditional Mediterranean Diet include: basil, bay leaves, chives, cloves, cumin, fennel, garlic, lavender, marjoram, mint, oregano, parsley, pepper, rosemary, sage, savory, sumac, tarragon, thyme   It's not just a diet, it's a lifestyle:  The Mediterranean diet includes lifestyle factors typical of those in the region  Foods, drinks and meals are best eaten with others and savored Daily physical activity is important for overall good health This could be strenuous exercise like running and aerobics This could also be more leisurely activities such as walking, housework, yard-work, or taking the stairs Moderation is the key; a balanced and healthy diet accommodates most foods and drinks Consider portion sizes and frequency of consumption of certain  foods   Meal Ideas & Options:  Breakfast:  Whole wheat toast or whole wheat English muffins with peanut butter & hard boiled egg Steel cut oats topped with apples & cinnamon and skim milk  Fresh fruit: banana, strawberries, melon, berries, peaches  Smoothies: strawberries, bananas, greek yogurt, peanut butter Low fat greek yogurt with blueberries and granola  Egg white omelet with spinach and mushrooms Breakfast couscous: whole wheat couscous, apricots, skim milk, cranberries  Sandwiches:  Hummus and grilled vegetables (peppers, zucchini, squash) on whole wheat bread   Grilled chicken on whole wheat pita with lettuce, tomatoes, cucumbers or tzatziki  Jordan salad on whole wheat bread: tuna salad made with greek yogurt, olives, red peppers, capers, green onions Garlic rosemary lamb pita: lamb sauted with garlic, rosemary, salt & pepper; add lettuce, cucumber, greek yogurt to pita - flavor with lemon juice and  black pepper  Seafood:  Mediterranean grilled salmon, seasoned with garlic, basil, parsley, lemon juice and black pepper Shrimp, lemon, and spinach whole-grain pasta salad made with low fat greek yogurt  Seared scallops with lemon orzo  Seared tuna steaks seasoned salt, pepper, coriander topped with tomato mixture of olives, tomatoes, olive oil, minced garlic, parsley, green onions and cappers  Meats:  Herbed greek chicken salad with kalamata olives, cucumber, feta  Red bell peppers stuffed with spinach, bulgur, lean ground beef (or lentils) & topped with feta   Kebabs: skewers of chicken, tomatoes, onions, zucchini, squash  [place] burgers: made with red onions, mint, dill, lemon juice, feta cheese topped with roasted red peppers Vegetarian Cucumber salad: cucumbers, artichoke hearts, celery, red onion, feta cheese, tossed in olive oil & lemon juice  Hummus and whole grain pita points with a greek salad (lettuce, tomato, feta, olives, cucumbers, red onion) Lentil soup with celery, carrots made with vegetable broth, garlic, salt and pepper  Tabouli salad: parsley, bulgur, mint, scallions, cucumbers, tomato, radishes, lemon juice, olive oil, salt and pepper.

## 2022-04-01 DIAGNOSIS — M1711 Unilateral primary osteoarthritis, right knee: Secondary | ICD-10-CM | POA: Diagnosis not present

## 2022-04-01 DIAGNOSIS — Z471 Aftercare following joint replacement surgery: Secondary | ICD-10-CM | POA: Diagnosis not present

## 2022-04-01 DIAGNOSIS — Z96651 Presence of right artificial knee joint: Secondary | ICD-10-CM | POA: Diagnosis not present

## 2022-04-01 NOTE — Telephone Encounter (Signed)
This was taken care of during his OV yesterday.

## 2022-04-03 DIAGNOSIS — Z471 Aftercare following joint replacement surgery: Secondary | ICD-10-CM | POA: Diagnosis not present

## 2022-04-03 DIAGNOSIS — Z96651 Presence of right artificial knee joint: Secondary | ICD-10-CM | POA: Diagnosis not present

## 2022-04-03 DIAGNOSIS — M1711 Unilateral primary osteoarthritis, right knee: Secondary | ICD-10-CM | POA: Diagnosis not present

## 2022-04-06 DIAGNOSIS — Z471 Aftercare following joint replacement surgery: Secondary | ICD-10-CM | POA: Diagnosis not present

## 2022-04-06 DIAGNOSIS — M1711 Unilateral primary osteoarthritis, right knee: Secondary | ICD-10-CM | POA: Diagnosis not present

## 2022-04-06 DIAGNOSIS — Z96651 Presence of right artificial knee joint: Secondary | ICD-10-CM | POA: Diagnosis not present

## 2022-04-08 DIAGNOSIS — Z96651 Presence of right artificial knee joint: Secondary | ICD-10-CM | POA: Diagnosis not present

## 2022-04-08 DIAGNOSIS — M1711 Unilateral primary osteoarthritis, right knee: Secondary | ICD-10-CM | POA: Diagnosis not present

## 2022-04-08 DIAGNOSIS — Z471 Aftercare following joint replacement surgery: Secondary | ICD-10-CM | POA: Diagnosis not present

## 2022-04-13 DIAGNOSIS — Z96651 Presence of right artificial knee joint: Secondary | ICD-10-CM | POA: Diagnosis not present

## 2022-04-13 DIAGNOSIS — Z471 Aftercare following joint replacement surgery: Secondary | ICD-10-CM | POA: Diagnosis not present

## 2022-04-13 DIAGNOSIS — M1711 Unilateral primary osteoarthritis, right knee: Secondary | ICD-10-CM | POA: Diagnosis not present

## 2022-04-15 DIAGNOSIS — Z96651 Presence of right artificial knee joint: Secondary | ICD-10-CM | POA: Diagnosis not present

## 2022-04-15 DIAGNOSIS — M1711 Unilateral primary osteoarthritis, right knee: Secondary | ICD-10-CM | POA: Diagnosis not present

## 2022-04-15 DIAGNOSIS — Z471 Aftercare following joint replacement surgery: Secondary | ICD-10-CM | POA: Diagnosis not present

## 2022-04-18 IMAGING — MR MR LUMBAR SPINE W/O CM
4 of 5 series · 27 of 48 positions shown · non-contrast
Comparison: Lumbar radiography 07/14/2019

CLINICAL DATA: Low back pain with right leg and hip pain.

EXAM:
MRI LUMBAR SPINE WITHOUT CONTRAST
TECHNIQUE: Multiplanar, multisequence MR imaging of the lumbar spine was
performed. No intravenous contrast was administered.

[Series 2: T2 · sagittal · 4.0mm · 1.09mm/px · 6 of 18 slices shown (1 of 2)]
[im 1/18]
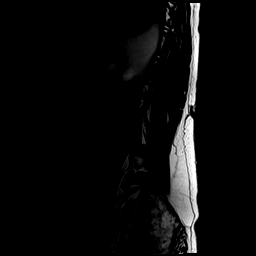
[im 4/18]
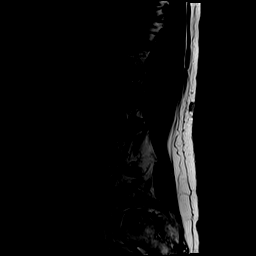
[im 7/18]
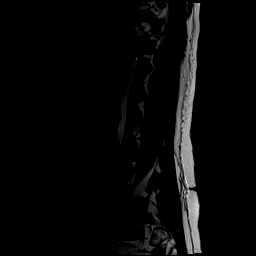
[im 11/18]
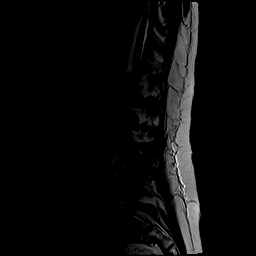
[im 14/18]
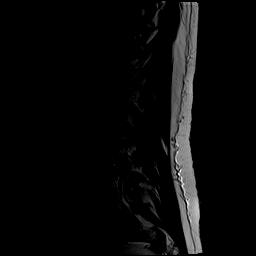
[im 18/18]
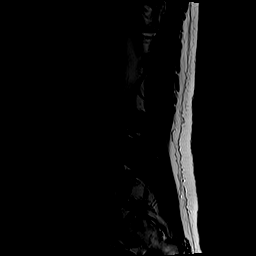

[Series 4: T1 · sagittal · 4.0mm · 1.09mm/px · 7 of 18 slices shown (1 of 2)]
[im 1/18]
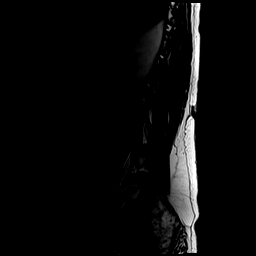
[im 3/18]
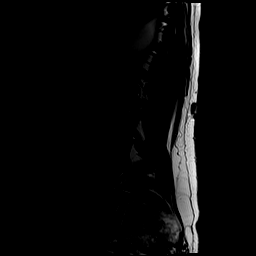
[im 6/18]
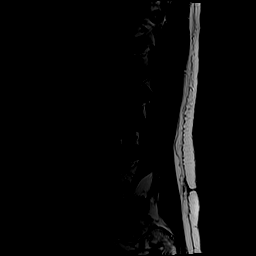
[im 9/18]
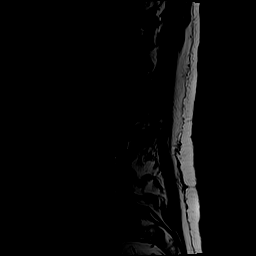
[im 12/18]
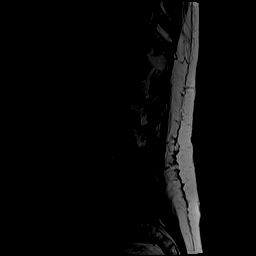
[im 15/18]
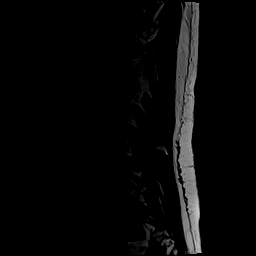
[im 18/18]
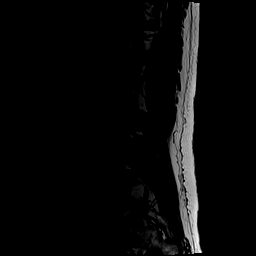

[Series 5: T2 · axial · 4.0mm · 0.39mm/px · z∈[-132,+57]mm · 8 of 38 slices shown (2 of 2)]
[im 1/38]
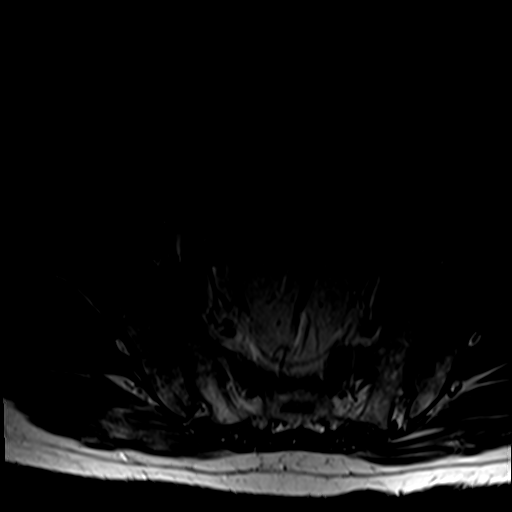
[im 6/38]
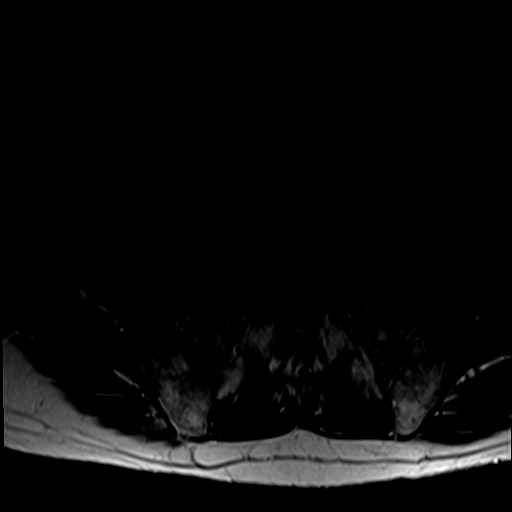
[im 12/38]
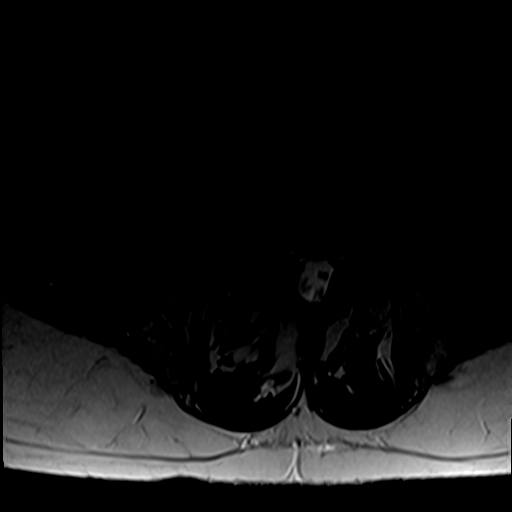
[im 18/38]
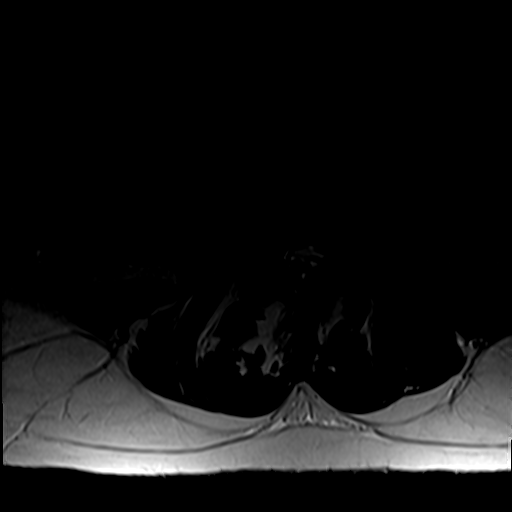
[im 20/38]
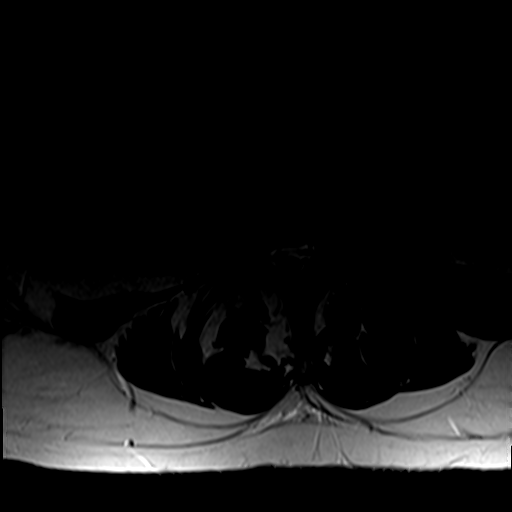
[im 26/38]
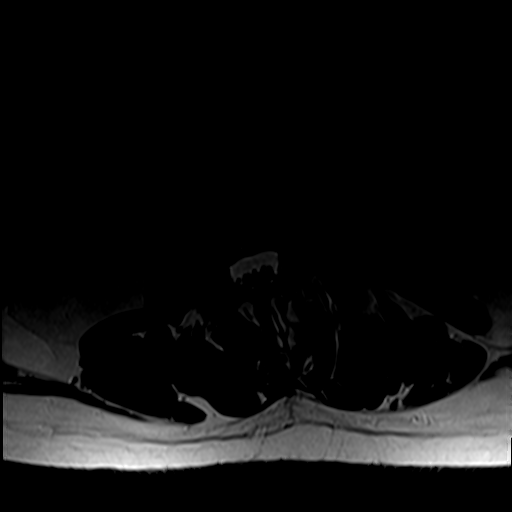
[im 32/38]
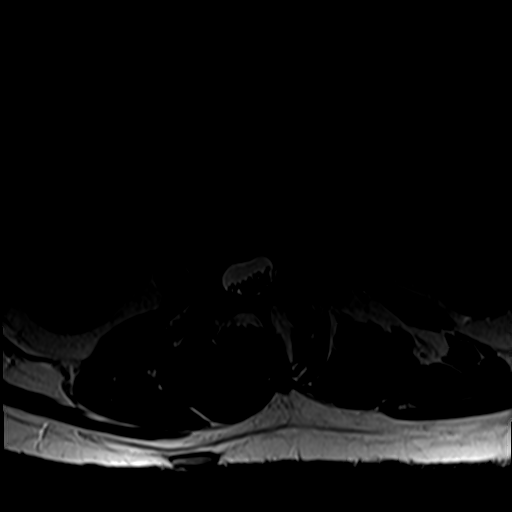
[im 38/38]
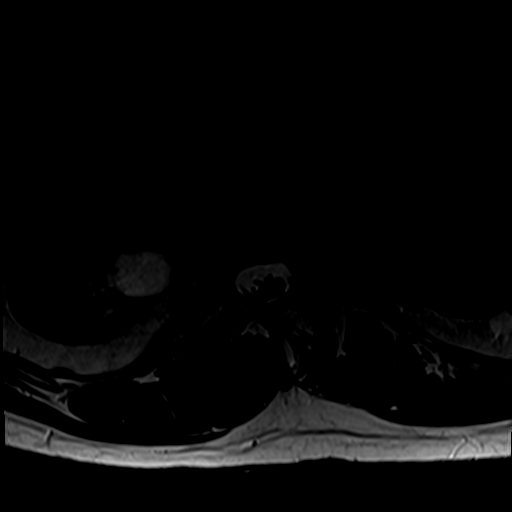

[Series 6: T1 · axial · 4.0mm · 0.39mm/px · z∈[-132,+29]mm · 6 of 38 slices shown (2 of 2)]
[im 1/38]
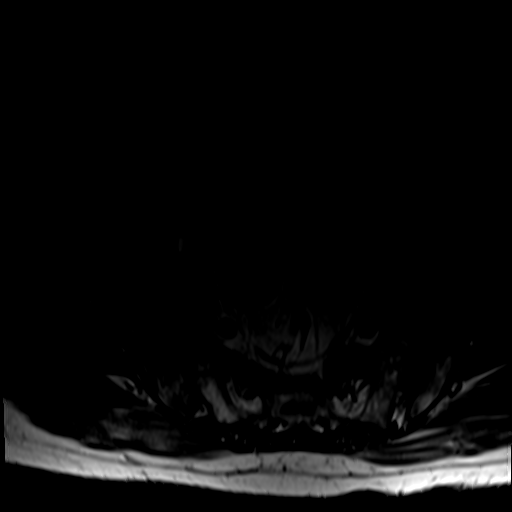
[im 6/38]
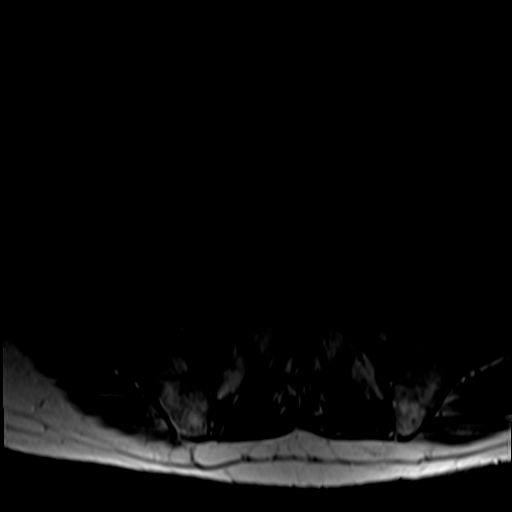
[im 12/38]
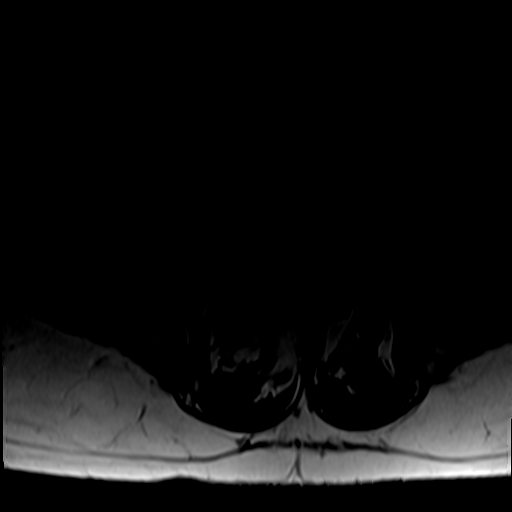
[im 18/38]
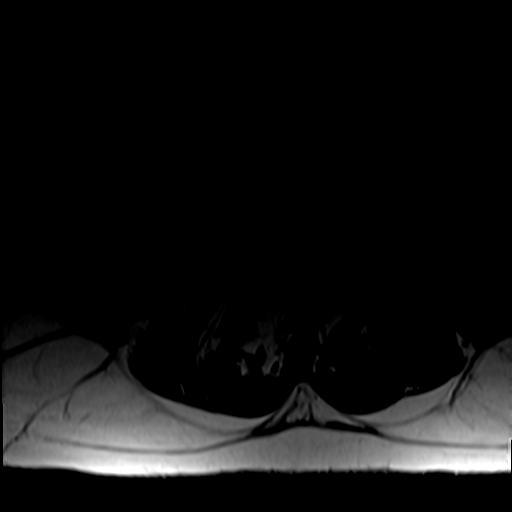
[im 20/38]
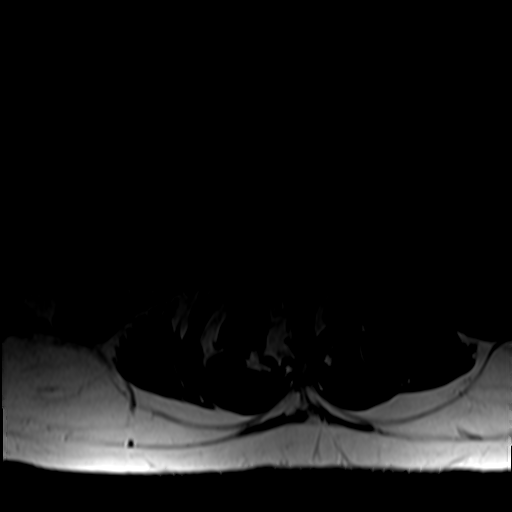
[im 32/38]
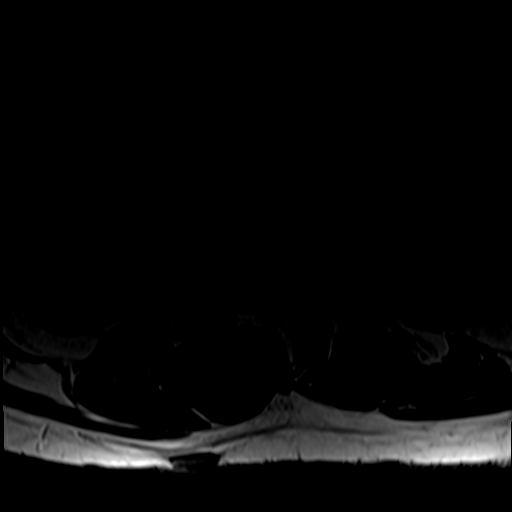

[27 of 48 positions shown; findings below may reference images not displayed]

FINDINGS: Segmentation: Presuming hypoplastic ribs at T12 there are 5 lumbar
type vertebrae.

Alignment:  Dextroscoliosis.  L5-S1 anterolisthesis.

Vertebrae: No evidence of fracture, discitis, or aggressive bone
lesion.

Conus medullaris and cauda equina: Conus extends to the T12-L1
level. Conus and cauda equina appear normal.

Paraspinal and other soft tissues: Bilateral renal cystic
intensities.

Disc levels:

T12- L1: Asymmetric leftward disc narrowing and bulging with
endplate ridging. Asymmetric left facet spurring. Moderate left
foraminal narrowing. Patent spinal canal. Bulky left far-lateral
spur deforming the upper psoas.

L1-L2: Asymmetric leftward disc narrowing and endplate ridging with
bulging. Moderate left foraminal narrowing. The canal is patent

L2-L3: Asymmetric leftward disc narrowing and bulging with endplate
ridging. Mild facet spurring.

L3-L4: Disc collapse and right eccentric bulging/ridging.
Degenerative facet spurring asymmetric to the right where there is
at least moderate foraminal stenosis. Moderate spinal stenosis.
Asymmetric effacement of the right subarticular recess but the
descending nerve root is medial and not compressed.

L4-L5: Asymmetric rightward disc collapse and far-lateral ridging.
Asymmetric right degenerative facet spurring. Right foraminal
impingement. The canal and bilateral subarticular recesses are
patent.

L5-S1:Facet osteoarthritis with spurring and anterolisthesis.
Bilateral joint effusion is present. The disc is narrowed and
bulging with a right foraminal protrusion superimposed based on
sagittal images. Advanced right foraminal impingement with L5 root
flattening. Right subarticular recess stenosis without static
compression.
IMPRESSION: 1. Generalized degenerative disease with dextroscoliosis.
2. T12-L1 and L1-2 moderate left foraminal stenosis. A bulky left
far-lateral osteophyte at T12-L1 displaces the upper psoas and could
contact the upper lumbar plexus.
3. L3-4 moderate right foraminal and subarticular recess stenosis.
4. L4-5 and L5-S1 high-grade right foraminal impingement.

## 2022-04-20 DIAGNOSIS — M1711 Unilateral primary osteoarthritis, right knee: Secondary | ICD-10-CM | POA: Diagnosis not present

## 2022-04-20 DIAGNOSIS — Z96651 Presence of right artificial knee joint: Secondary | ICD-10-CM | POA: Diagnosis not present

## 2022-04-20 DIAGNOSIS — Z471 Aftercare following joint replacement surgery: Secondary | ICD-10-CM | POA: Diagnosis not present

## 2022-04-22 DIAGNOSIS — M1711 Unilateral primary osteoarthritis, right knee: Secondary | ICD-10-CM | POA: Diagnosis not present

## 2022-04-22 DIAGNOSIS — Z96651 Presence of right artificial knee joint: Secondary | ICD-10-CM | POA: Diagnosis not present

## 2022-04-22 DIAGNOSIS — Z471 Aftercare following joint replacement surgery: Secondary | ICD-10-CM | POA: Diagnosis not present

## 2022-04-28 ENCOUNTER — Ambulatory Visit (INDEPENDENT_AMBULATORY_CARE_PROVIDER_SITE_OTHER): Payer: Medicare HMO | Admitting: Physician Assistant

## 2022-04-28 DIAGNOSIS — Z96651 Presence of right artificial knee joint: Secondary | ICD-10-CM

## 2022-04-28 NOTE — Progress Notes (Signed)
Post-Op Visit Note   Patient: Eric Crane           Date of Birth: 04-06-1955           MRN: [id number] Visit Date: 04/28/2022 PCP: Loralee Pacas, MD   Assessment & Plan:  Chief Complaint:  Chief Complaint  Patient presents with   Right Knee - Follow-up    Right total knee arthroplasty 02/02/2022   Visit Diagnoses:  1. Status post total right knee replacement     Plan: Patient is a pleasant 67 year old gentleman who comes in today 3 months status post right total knee replacement 02/02/2022.  He has been doing well.  He notes swelling to the right knee which appears to be worse when he is standing on his feet working all day.  He does get relief with Tylenol, ice and elevation.  He has continued to work on a home exercise program as he has finished formal PT.  Overall doing well.  Examination of the right knee reveals range of motion from 0 to 115 degrees.  Stable to valgus and varus stress.  He is neurovascular intact distally.  At this point, he will continue to work on range of motion exercises.  Dental prophylaxis reinforced.  Follow-up in 3 months for repeat evaluation and 2 view x-rays of the right knee.  Call with concerns or questions.  Follow-Up Instructions: Return in about 3 months (around 07/28/2022).   Orders:  No orders of the defined types were placed in this encounter.  No orders of the defined types were placed in this encounter.   Imaging: No new imaging  PMFS History: Patient Active Problem List   Diagnosis Date Noted   Prediabetes 03/23/2022   H/O total knee replacement 02/02/2022   Overweight 2021-12-08   FH: heart disease 12-08-2021   Osteoarthritis of lower back 12-08-21   Hyperlipidemia, acquired 12-08-2021   Thrombocytopenia 08-Dec-2021   Attention deficit hyperactivity disorder (ADHD) 12-08-21   Bipolar 2 disorder 12/08/2021   Primary osteoarthritis of right knee 04/07/2021   Past Medical History:  Diagnosis Date   Bipolar 2 disorder  ([facility]) Dec 08, 2021   Depression    FH: heart disease December 08, 2021   Aunt died early 72s of some weird heart thing 3 aunts total died in 69s of heart problems Hasn't spoken to father since 1979 so doesn't know that history but knows they have high longevity.    Osteoarthritis of lower back 2021/12/08   MRI 2021 Rarely ever hurts  T12- L1: Asymmetric leftward disc narrowing and bulging with endplate ridging. Asymmetric left facet spurring. Moderate left foraminal narrowing. Patent spinal canal. Bulky left far-lateral spur deforming the upper psoas.   L1-L2: Asymmetric leftward disc narrowing and endplate ridging with bulging. Moderate left foraminal narrowing. The canal is patent   L2-L3: Asymmet   Overweight 12/08/2021   Thrombocytopenia ([facility]) 2021-12-08    Family History  Problem Relation Age of Onset   Heart disease Mother     Past Surgical History:  Procedure Laterality Date   COLONOSCOPY WITH PROPOFOL N/A 02/18/2016   Procedure: COLONOSCOPY WITH PROPOFOL;  Surgeon: Garlan Fair, MD;  Location: [facility];  Service: Endoscopy;  Laterality: N/A;   TONSILLECTOMY     removed as a child   TOTAL KNEE ARTHROPLASTY Right 02/02/2022   Procedure: RIGHT TOTAL KNEE ARTHROPLASTY;  Surgeon: Leandrew Koyanagi, MD;  Location: [facility];  Service: Orthopedics;  Laterality: Right;   Social History   Occupational History   Occupation:  [employer]  Tobacco Use   Smoking status: Never   Smokeless tobacco: Never  Vaping Use   Vaping Use: Never used  Substance and Sexual Activity   Alcohol use: No   Drug use: No   Sexual activity: Not on file

## 2022-05-11 ENCOUNTER — Encounter: Payer: Self-pay | Admitting: Internal Medicine

## 2022-05-11 DIAGNOSIS — E785 Hyperlipidemia, unspecified: Secondary | ICD-10-CM

## 2022-05-11 DIAGNOSIS — Z789 Other specified health status: Secondary | ICD-10-CM

## 2022-05-21 ENCOUNTER — Telehealth: Payer: Self-pay | Admitting: Pharmacist

## 2022-05-21 DIAGNOSIS — E785 Hyperlipidemia, unspecified: Secondary | ICD-10-CM

## 2022-05-21 NOTE — Telephone Encounter (Signed)
This patient has been identified as "high risk" and in the top 25% of risk stratification for [facility] patients.  These patients were identified using a number of factors including # of hospitalizations, ED visits, HF exacerbations, elevated BP and A1c, and overall cost of care.   Referral placed for cosign by the PCP.  [facility] team to schedule once cosigned.  Nolyn Swab, PharmD Clinical Pharmacist  [facility] [phone]  

## 2022-05-26 ENCOUNTER — Encounter: Payer: Medicare HMO | Admitting: Pharmacist

## 2022-05-29 ENCOUNTER — Telehealth: Payer: Self-pay | Admitting: Pharmacist

## 2022-05-29 DIAGNOSIS — E785 Hyperlipidemia, unspecified: Secondary | ICD-10-CM

## 2022-05-29 NOTE — Telephone Encounter (Signed)
This patient has been identified as "high risk" and in the top 25% of risk stratification for [facility] accountable patients.  These patients were identified using a number of factors including # of hospitalizations, ED visits, HF exacerbations, elevated BP and A1c, and overall cost of care.   Referral placed for cosign by the PCP.  [facility] team to schedule once cosigned.  Mikela Senn, PharmD Clinical Pharmacist  [facility] [phone]  

## 2022-06-11 DIAGNOSIS — Z789 Other specified health status: Secondary | ICD-10-CM | POA: Insufficient documentation

## 2022-06-11 MED ORDER — LOVASTATIN 20 MG PO TABS: 20.0000 mg | ORAL_TABLET | Freq: Every day | ORAL | 3 refills | Status: DC

## 2022-07-02 DIAGNOSIS — H40013 Open angle with borderline findings, low risk, bilateral: Secondary | ICD-10-CM | POA: Diagnosis not present

## 2022-07-02 DIAGNOSIS — Z0100 Encounter for examination of eyes and vision without abnormal findings: Secondary | ICD-10-CM | POA: Diagnosis not present

## 2022-07-27 DIAGNOSIS — Z96651 Presence of right artificial knee joint: Secondary | ICD-10-CM | POA: Insufficient documentation

## 2022-07-28 ENCOUNTER — Other Ambulatory Visit (INDEPENDENT_AMBULATORY_CARE_PROVIDER_SITE_OTHER): Payer: Medicare HMO

## 2022-07-28 ENCOUNTER — Ambulatory Visit: Payer: Medicare HMO | Admitting: Orthopaedic Surgery

## 2022-07-28 DIAGNOSIS — Z96651 Presence of right artificial knee joint: Secondary | ICD-10-CM

## 2022-07-28 NOTE — Progress Notes (Signed)
Office Visit Note   Patient: Eric Crane           Date of Birth: 1955/03/11           MRN: [id number] Visit Date: 07/28/2022              Requested by: Lula Olszewski, MD [address] [place],  [place] [address] PCP: Lula Olszewski, MD   Assessment & Plan: Visit Diagnoses:  1. Status post total right knee replacement     Plan: Claytin is 6 months status post right total knee replacement.  He is doing well has returned back to normal activities.  He is able to play hockey without any problems.  He has no complaints.  Dental prophylaxis reinforced.  Recheck in 6 months with two-view x-rays of the right knee.  Follow-Up Instructions: Return in about 6 months (around 01/28/2023).   Orders:  Orders Placed This Encounter  Procedures   XR Knee 1-2 Views Right   No orders of the defined types were placed in this encounter.     Procedures: No procedures performed   Clinical Data: No additional findings.   Subjective: Chief Complaint  Patient presents with   Right Knee - Follow-up    HPI Eric Crane returns today for 7-month postop check status post right total knee replacement on 02/02/2022. Review of Systems   Objective: Vital Signs: There were no vitals taken for this visit.  Physical Exam  Ortho Exam Examination of the right knee shows fully healed surgical scar.  Excellent range of motion.  Stable collaterals. Specialty Comments:  No specialty comments available.  Imaging: XR Knee 1-2 Views Right  Result Date: 07/28/2022 2 view x-rays demonstrate status post right total knee replacement without any evidence of loosening or subsidence.    PMFS History: Patient Active Problem List   Diagnosis Date Noted   Status post total right knee replacement 07/27/2022   Statin intolerance 06/11/2022   Prediabetes 03/23/2022   H/O total knee replacement 02/02/2022   Overweight 11-26-2021   FH: heart disease 26-Nov-2021   Osteoarthritis of lower back  26-Nov-2021   Hyperlipidemia, acquired 26-Nov-2021   Thrombocytopenia ([facility]) 2021/11/26   Attention deficit hyperactivity disorder (ADHD) 2021/11/26   Bipolar 2 disorder ([facility]) 11-26-21   Primary osteoarthritis of right knee 04/07/2021   Past Medical History:  Diagnosis Date   Bipolar 2 disorder ([facility]) 26-Nov-2021   Depression    FH: heart disease 11/26/2021   Aunt died early 17s of some weird heart thing 3 aunts total died in 66s of heart problems Hasn't spoken to father since 1979 so doesn't know that history but knows they have high longevity.    Osteoarthritis of lower back 11-26-2021   MRI 2021 Rarely ever hurts  T12- L1: Asymmetric leftward disc narrowing and bulging with endplate ridging. Asymmetric left facet spurring. Moderate left foraminal narrowing. Patent spinal canal. Bulky left far-lateral spur deforming the upper psoas.   L1-L2: Asymmetric leftward disc narrowing and endplate ridging with bulging. Moderate left foraminal narrowing. The canal is patent   L2-L3: Asymmet   Overweight 11-26-2021   Thrombocytopenia ([facility]) 26-Nov-2021    Family History  Problem Relation Age of Onset   Heart disease Mother     Past Surgical History:  Procedure Laterality Date   COLONOSCOPY WITH PROPOFOL N/A 02/18/2016   Procedure: COLONOSCOPY WITH PROPOFOL;  Surgeon: Charolett Bumpers, MD;  Location: [facility];  Service: Endoscopy;  Laterality: N/A;   TONSILLECTOMY  removed as a child   TOTAL KNEE ARTHROPLASTY Right 02/02/2022   Procedure: RIGHT TOTAL KNEE ARTHROPLASTY;  Surgeon: Tarry Kos, MD;  Location: [facility];  Service: Orthopedics;  Laterality: Right;   Social History   Occupational History   Occupation: Cytogeneticist  Tobacco Use   Smoking status: Never   Smokeless tobacco: Never  Vaping Use   Vaping Use: Never used  Substance and Sexual Activity   Alcohol use: No   Drug use: No   Sexual activity: Not on file

## 2022-08-17 ENCOUNTER — Other Ambulatory Visit (INDEPENDENT_AMBULATORY_CARE_PROVIDER_SITE_OTHER): Payer: Medicare HMO

## 2022-08-17 ENCOUNTER — Encounter: Payer: Self-pay | Admitting: Physician Assistant

## 2022-08-17 ENCOUNTER — Ambulatory Visit: Payer: Medicare HMO | Admitting: Physician Assistant

## 2022-08-17 DIAGNOSIS — M25552 Pain in left hip: Secondary | ICD-10-CM | POA: Insufficient documentation

## 2022-08-17 NOTE — Progress Notes (Signed)
Office Visit Note   Patient: Eric Crane           Date of Birth: 30-Jul-1955           MRN: [id number] Visit Date: 08/17/2022              Requested by: Lula Olszewski, MD [address] [place],  [place] [address] PCP: Lula Olszewski, MD  Chief Complaint  Patient presents with   Left Hip - Pain      HPI: Lily is a pleasant 67 year old gentleman who I have seen in the past for right knee arthritis.  He has since had a right knee replacement with Dr. Roda Shutters.  He comes in today complaining of left lateral hip pain.  Denies any injury.  He is very active and plays hockey a few times a week.  Denies any bad falls or any other injuries.  Denies any pain in his groin or his lower back or any weakness.  States it only hurts when he is walking rates his pain to moderate when it does occur  Assessment & Plan: Visit Diagnoses:  1. Pain of left hip   2. Pain in left hip     Plan: He does not have any pain over the troches bursa and this does not hurt when he lays on it just with walking.  Would favor IT band tendinitis.  He has great strength.  Could not reproduce anything on palpation today.  Discussed going on Advil on a regular basis rather than as needed for the next couple weeks.  Will also try Voltaren gel.  Will contact me if he does not improve  Follow-Up Instructions: No follow-ups on file.   Ortho Exam  Patient is alert, oriented, no adenopathy, well-dressed, normal affect, normal respiratory effort. Examination of the left hip no erythema no redness no is no posterior buttock pain is strength is 5 out of 5 no pain with range of motion of his hip no pain over the trochanteric bursa area of pain is just superior to this but has great strength with resisted abduction abduction  Imaging: No results found. No images are attached to the encounter.  Labs: Lab Results  Component Value Date   HGBA1C 6.1 03/23/2022   HGBA1C 6.3 03/11/2021     Lab Results  Component  Value Date   ALBUMIN 4.1 03/23/2022    No results found for: "MG" No results found for: "VD25OH"  No results found for: "PREALBUMIN"    Latest Ref Rng & Units 03/23/2022   10:26 AM 02/03/2022    3:27 AM 01/21/2022    8:55 AM  CBC EXTENDED  WBC 4.0 - 10.5 K/uL 5.3  12.1  6.5   RBC 4.22 - 5.81 Mil/uL 5.12  4.68  5.53   Hemoglobin 13.0 - 17.0 g/dL 78.2  95.6  21.3   HCT 39.0 - 52.0 % 44.4  40.4  47.8   Platelets 150.0 - 400.0 K/uL 209.0  158  206      There is no height or weight on file to calculate BMI.  Orders:  Orders Placed This Encounter  Procedures   XR HIP UNILAT W OR W/O PELVIS 2-3 VIEWS LEFT   No orders of the defined types were placed in this encounter.    Procedures: No procedures performed  Clinical Data: No additional findings.  ROS:  All other systems negative, except as noted in the HPI. Review of Systems  Objective: Vital Signs: There were  no vitals taken for this visit.  Specialty Comments:  No specialty comments available.  PMFS History: Patient Active Problem List   Diagnosis Date Noted   Pain in left hip 08/17/2022   Status post total right knee replacement 07/27/2022   Statin intolerance 06/11/2022   Prediabetes 03/23/2022   H/O total knee replacement 02/02/2022   Overweight 12-11-2021   FH: heart disease 12-11-21   Osteoarthritis of lower back 12/11/2021   Hyperlipidemia, acquired 2021/12/11   Thrombocytopenia ([facility]) 2021-12-11   Attention deficit hyperactivity disorder (ADHD) December 11, 2021   Bipolar 2 disorder ([facility]) December 11, 2021   Primary osteoarthritis of right knee 04/07/2021   Past Medical History:  Diagnosis Date   Bipolar 2 disorder ([facility]) December 11, 2021   Depression    FH: heart disease 2021/12/11   Aunt died early 81s of some weird heart thing 3 aunts total died in 24s of heart problems Hasn't spoken to father since 1979 so doesn't know that history but knows they have high longevity.    Osteoarthritis of lower back 12/11/2021    MRI 2021 Rarely ever hurts  T12- L1: Asymmetric leftward disc narrowing and bulging with endplate ridging. Asymmetric left facet spurring. Moderate left foraminal narrowing. Patent spinal canal. Bulky left far-lateral spur deforming the upper psoas.   L1-L2: Asymmetric leftward disc narrowing and endplate ridging with bulging. Moderate left foraminal narrowing. The canal is patent   L2-L3: Asymmet   Overweight 12/11/2021   Thrombocytopenia ([facility]) 12/11/21    Family History  Problem Relation Age of Onset   Heart disease Mother     Past Surgical History:  Procedure Laterality Date   COLONOSCOPY WITH PROPOFOL N/A 02/18/2016   Procedure: COLONOSCOPY WITH PROPOFOL;  Surgeon: Charolett Bumpers, MD;  Location: [facility];  Service: Endoscopy;  Laterality: N/A;   TONSILLECTOMY     removed as a child   TOTAL KNEE ARTHROPLASTY Right 02/02/2022   Procedure: RIGHT TOTAL KNEE ARTHROPLASTY;  Surgeon: Tarry Kos, MD;  Location: [facility];  Service: Orthopedics;  Laterality: Right;   Social History   Occupational History   Occupation: Cytogeneticist  Tobacco Use   Smoking status: Never   Smokeless tobacco: Never  Vaping Use   Vaping status: Never Used  Substance and Sexual Activity   Alcohol use: No   Drug use: No   Sexual activity: Not on file

## 2022-09-20 ENCOUNTER — Encounter: Payer: Self-pay | Admitting: Internal Medicine

## 2022-09-30 ENCOUNTER — Other Ambulatory Visit: Payer: Self-pay

## 2022-09-30 DIAGNOSIS — E785 Hyperlipidemia, unspecified: Secondary | ICD-10-CM

## 2022-09-30 DIAGNOSIS — E663 Overweight: Secondary | ICD-10-CM

## 2022-09-30 NOTE — Telephone Encounter (Signed)
Ordered lipid panel for future.

## 2022-09-30 NOTE — Telephone Encounter (Signed)
Patient scheduled for labs tomorrow @ 3pm , he is aware of fasting at least 6 hours prior. Please place future lipid panel so he can be drawn.

## 2022-10-01 ENCOUNTER — Other Ambulatory Visit (INDEPENDENT_AMBULATORY_CARE_PROVIDER_SITE_OTHER): Payer: Medicare HMO

## 2022-10-01 DIAGNOSIS — E663 Overweight: Secondary | ICD-10-CM | POA: Diagnosis not present

## 2022-10-01 DIAGNOSIS — E785 Hyperlipidemia, unspecified: Secondary | ICD-10-CM

## 2022-10-01 DIAGNOSIS — Z125 Encounter for screening for malignant neoplasm of prostate: Secondary | ICD-10-CM | POA: Diagnosis not present

## 2022-10-02 LAB — LIPID PANEL
Cholesterol: 188 mg/dL (ref 0–200)
HDL: 47.4 mg/dL (ref >39.00)
LDL Cholesterol: 121 mg/dL — ABNORMAL HIGH (ref 0–99)
NonHDL: 140.14
Total CHOL/HDL Ratio: 4
Triglycerides: 97 mg/dL (ref 0.0–149.0)
VLDL: 19.4 mg/dL (ref 0.0–40.0)

## 2022-10-02 LAB — PSA: PSA: 1.02 ng/mL (ref 0.10–4.00)

## 2022-10-06 ENCOUNTER — Encounter: Payer: Self-pay | Admitting: Internal Medicine

## 2022-10-06 DIAGNOSIS — Z125 Encounter for screening for malignant neoplasm of prostate: Secondary | ICD-10-CM | POA: Insufficient documentation

## 2022-10-06 DIAGNOSIS — Z79899 Other long term (current) drug therapy: Secondary | ICD-10-CM | POA: Insufficient documentation

## 2022-10-06 NOTE — Progress Notes (Signed)
If patient has viewed MyChart notification, please disregard this message and close the task.  Actions Required: Mail printout of MyChart message if appropriate.

## 2023-01-01 DIAGNOSIS — H40023 Open angle with borderline findings, high risk, bilateral: Secondary | ICD-10-CM | POA: Diagnosis not present

## 2023-01-28 ENCOUNTER — Ambulatory Visit: Payer: Medicare HMO | Admitting: Physician Assistant

## 2023-01-28 ENCOUNTER — Encounter: Payer: Self-pay | Admitting: Physician Assistant

## 2023-01-28 ENCOUNTER — Other Ambulatory Visit (INDEPENDENT_AMBULATORY_CARE_PROVIDER_SITE_OTHER): Payer: Self-pay

## 2023-01-28 DIAGNOSIS — Z96651 Presence of right artificial knee joint: Secondary | ICD-10-CM

## 2023-01-28 NOTE — Progress Notes (Signed)
 Post-Op Visit Note   Patient: Eric Crane           Date of Birth: [birth year]           MRN: [id number] Visit Date: 01/28/2023 PCP: Jesus Bernardino MATSU, MD   Assessment & Plan:  Chief Complaint:  Chief Complaint  Patient presents with   Right Knee - Follow-up    Right total knee arthroplasty 02/02/2022   Visit Diagnoses:  1. Status post total right knee replacement     Plan: Patient is a pleasant 68 year old gentleman who comes in today 1 year status post right total knee replacement 02/02/2022.  He has been doing very well.  No complaints.  Examination of the right knee reveals range of motion from 0 to 130 degrees.  Stable to valgus varus stress.  He is neurovascularly intact distally.  At this point, he will continue to advance with activity as tolerated.  Dental prophylaxis reinforced for another year.  Follow-up in 1 year for repeat evaluation and 2 view x-rays of the right knee.  Call with concerns or questions.  Follow-Up Instructions: Return in about 1 year (around 01/28/2024).   Orders:  Orders Placed This Encounter  Procedures   XR Knee 1-2 Views Right   No orders of the defined types were placed in this encounter.   Imaging: No results found.  PMFS History: Patient Active Problem List   Diagnosis Date Noted   Screening PSA (prostate specific antigen) 10/06/2022   Pain in left hip 08/17/2022   Status post total right knee replacement 07/27/2022   Statin intolerance 06/11/2022   Prediabetes 03/23/2022   H/O total knee replacement 02/02/2022   Weight disorder 11/25/2021   FH: heart disease 11-25-21   Osteoarthritis of lower back 11-25-2021   Hyperlipidemia, acquired November 25, 2021   Thrombocytopenia ([facility]) 2021/11/25   Attention deficit hyperactivity disorder (ADHD) November 25, 2021   Bipolar 2 disorder ([facility]) November 25, 2021   Primary osteoarthritis of right knee 04/07/2021   Past Medical History:  Diagnosis Date   Bipolar 2 disorder ([facility]) 25-Nov-2021   Depression     FH: heart disease 11/25/2021   Aunt died early 92s of some weird heart thing 3 aunts total died in 49s of heart problems Hasn't spoken to father since 1979 so doesn't know that history but knows they have high longevity.    Osteoarthritis of lower back 25-Nov-2021   MRI 2021 Rarely ever hurts  T12- L1: Asymmetric leftward disc narrowing and bulging with endplate ridging. Asymmetric left facet spurring. Moderate left foraminal narrowing. Patent spinal canal. Bulky left far-lateral spur deforming the upper psoas.   L1-L2: Asymmetric leftward disc narrowing and endplate ridging with bulging. Moderate left foraminal narrowing. The canal is patent   L2-L3: Asymmet   Overweight 2021-11-25   Thrombocytopenia ([facility]) 11/25/2021    Family History  Problem Relation Age of Onset   Heart disease Mother     Past Surgical History:  Procedure Laterality Date   COLONOSCOPY WITH PROPOFOL  N/A 02/18/2016   Procedure: COLONOSCOPY WITH PROPOFOL ;  Surgeon: Gladis MARLA Louder, MD;  Location: [facility];  Service: Endoscopy;  Laterality: N/A;   TONSILLECTOMY     removed as a child   TOTAL KNEE ARTHROPLASTY Right 02/02/2022   Procedure: RIGHT TOTAL KNEE ARTHROPLASTY;  Surgeon: Jerri Kay HERO, MD;  Location: [facility];  Service: Orthopedics;  Laterality: Right;   Social History   Occupational History   Occupation: cytogeneticist  Tobacco Use   Smoking status: Never   Smokeless tobacco:  Never  Vaping Use   Vaping status: Never Used  Substance and Sexual Activity   Alcohol use: No   Drug use: No   Sexual activity: Not on file

## 2023-03-12 ENCOUNTER — Other Ambulatory Visit: Payer: Self-pay | Admitting: Internal Medicine

## 2023-03-12 DIAGNOSIS — E785 Hyperlipidemia, unspecified: Secondary | ICD-10-CM

## 2023-03-25 ENCOUNTER — Encounter: Payer: Medicare HMO | Admitting: Internal Medicine

## 2023-03-26 ENCOUNTER — Ambulatory Visit: Payer: Medicare HMO | Admitting: Internal Medicine

## 2023-03-26 ENCOUNTER — Encounter: Payer: Self-pay | Admitting: Internal Medicine

## 2023-03-26 VITALS — BP 167/80 | HR 54 | Temp 98.1°F | Ht 67.0 in | Wt 168.0 lb

## 2023-03-26 DIAGNOSIS — I10 Essential (primary) hypertension: Secondary | ICD-10-CM

## 2023-03-26 DIAGNOSIS — R0609 Other forms of dyspnea: Secondary | ICD-10-CM | POA: Diagnosis not present

## 2023-03-26 DIAGNOSIS — D485 Neoplasm of uncertain behavior of skin: Secondary | ICD-10-CM

## 2023-03-26 DIAGNOSIS — J342 Deviated nasal septum: Secondary | ICD-10-CM

## 2023-03-26 DIAGNOSIS — Z125 Encounter for screening for malignant neoplasm of prostate: Secondary | ICD-10-CM | POA: Diagnosis not present

## 2023-03-26 DIAGNOSIS — N183 Chronic kidney disease, stage 3 unspecified: Secondary | ICD-10-CM | POA: Diagnosis not present

## 2023-03-26 DIAGNOSIS — E785 Hyperlipidemia, unspecified: Secondary | ICD-10-CM | POA: Diagnosis not present

## 2023-03-26 DIAGNOSIS — R7303 Prediabetes: Secondary | ICD-10-CM

## 2023-03-26 DIAGNOSIS — Z0000 Encounter for general adult medical examination without abnormal findings: Secondary | ICD-10-CM | POA: Diagnosis not present

## 2023-03-26 LAB — CBC WITH DIFFERENTIAL/PLATELET
Basophils Absolute: 0.1 10*3/uL (ref 0.0–0.1)
Basophils Relative: 2.8 % (ref 0.0–3.0)
Eosinophils Absolute: 0.3 10*3/uL (ref 0.0–0.7)
Eosinophils Relative: 5.9 % — ABNORMAL HIGH (ref 0.0–5.0)
HCT: 46 % (ref 39.0–52.0)
Hemoglobin: 15.2 g/dL (ref 13.0–17.0)
Lymphocytes Relative: 26.9 % (ref 12.0–46.0)
Lymphs Abs: 1.1 10*3/uL (ref 0.7–4.0)
MCHC: 33.1 g/dL (ref 30.0–36.0)
MCV: 90 fL (ref 78.0–100.0)
Monocytes Absolute: 0.6 10*3/uL (ref 0.1–1.0)
Monocytes Relative: 14.6 % — ABNORMAL HIGH (ref 3.0–12.0)
Neutro Abs: 2.1 10*3/uL (ref 1.4–7.7)
Neutrophils Relative %: 49.8 % (ref 43.0–77.0)
Platelets: 144 10*3/uL — ABNORMAL LOW (ref 150.0–400.0)
RBC: 5.1 Mil/uL (ref 4.22–5.81)
RDW: 14.3 % (ref 11.5–15.5)
WBC: 4.2 10*3/uL (ref 4.0–10.5)

## 2023-03-26 LAB — COMPREHENSIVE METABOLIC PANEL
ALT: 20 U/L (ref 0–53)
AST: 18 U/L (ref 0–37)
Albumin: 4.2 g/dL (ref 3.5–5.2)
Alkaline Phosphatase: 100 U/L (ref 39–117)
BUN: 22 mg/dL (ref 6–23)
CO2: 27 meq/L (ref 19–32)
Calcium: 9.2 mg/dL (ref 8.4–10.5)
Chloride: 106 meq/L (ref 96–112)
Creatinine, Ser: 1.37 mg/dL (ref 0.40–1.50)
GFR: 53.88 mL/min — ABNORMAL LOW (ref >60.00)
Glucose, Bld: 108 mg/dL — ABNORMAL HIGH (ref 70–99)
Potassium: 4.4 meq/L (ref 3.5–5.1)
Sodium: 139 meq/L (ref 135–145)
Total Bilirubin: 0.4 mg/dL (ref 0.2–1.2)
Total Protein: 6.5 g/dL (ref 6.0–8.3)

## 2023-03-26 LAB — LIPID PANEL
Cholesterol: 190 mg/dL (ref 0–200)
HDL: 51.7 mg/dL (ref >39.00)
LDL Cholesterol: 124 mg/dL — ABNORMAL HIGH (ref 0–99)
NonHDL: 138.36
Total CHOL/HDL Ratio: 4
Triglycerides: 72 mg/dL (ref 0.0–149.0)
VLDL: 14.4 mg/dL (ref 0.0–40.0)

## 2023-03-26 LAB — PSA: PSA: 0.93 ng/mL (ref 0.10–4.00)

## 2023-03-26 LAB — HEMOGLOBIN A1C: Hgb A1c MFr Bld: 6.1 % (ref 4.6–6.5)

## 2023-03-26 LAB — TSH: TSH: 0.98 u[IU]/mL (ref 0.35–5.50)

## 2023-03-26 MED ORDER — LOSARTAN POTASSIUM 25 MG PO TABS: 25.0000 mg | ORAL_TABLET | Freq: Every day | ORAL | 3 refills | Status: DC

## 2023-03-26 NOTE — Progress Notes (Signed)
 -- Annual Preventive Medical Office Visit --  Patient:  Eric Crane      Age: 68 y.o.       Sex:  male  Date:   03/26/2023 Patient Care Team: Lula Olszewski, MD as PCP - General (Internal Medicine) Tarry Kos, MD as Attending Physician (Orthopedic Surgery) Milagros Evener, MD as Consulting Physician (Psychiatry) Persons, [place], [place] (Orthopedic Surgery) Today's Healthcare Provider: Lula Olszewski, MD  ========================================= Chief complaint: Annual Exam Purpose of Visit: Comprehensive preventive health assessment and personalized health maintenance planning.  This encounter was conducted as a Comprehensive Physical Exam (CPE) preventive care annual visit. The patient's medical history and problem list were reviewed to inform individualized preventive care recommendations. No problem-specific medical treatment was provided during this visit.    Assessment & Plan Encounter for annual health examination A routine annual physical exam revealed no acute complaints. Fullness in the left posterior neck is likely due to muscle hypertrophy from rowing, with no need for an MRI. Discussed a full-body MRI for comprehensive cancer screening, which is not covered by insurance. Recommended sinus rinses for nasal congestion due to a deviated septum. Conduct a comprehensive physical exam and order routine blood work, including cholesterol, liver, kidney, prostate cancer, thyroid cancer, and diabetes screenings. Schedule the next annual physical in 12 months and recommend sinus rinses.  Regular physical activity includes playing ice hockey and rowing. Diet could be improved by reducing carbohydrate intake. Discussed a full-body MRI for comprehensive cancer screening, though not covered by insurance. Encourage reducing carbohydrate intake, consider a full-body MRI if financially feasible, and continue regular physical activity. Follow up in 2 weeks for blood pressure and CT scan  results. Schedule a Medicare preventive exam (annual wellness visit) in 6 months and the next annual physical in 12 months. Neoplasm of uncertain behavior of skin Photographs Taken 03/27/2023 :       Dyspnea on exertion Episodes of shortness of breath occur when leaning over and standing up, possibly related to hypertension. A CT scan will be ordered to investigate further. He will defer cardiology referral until available. Primary hypertension Elevated blood pressure may contribute to shortness of breath. There is no history of alcohol or NSAID use. Start antihypertensive medication at a low dose and monitor blood pressure at home, bringing readings to the follow-up. Follow up in 2 weeks for blood pressure and CT scan results. Prediabetes A slight increase in abdominal fat was noted. Check A1c levels with an A1c test. Hyperlipidemia, acquired A previous statin caused leg cramps, so a different medication was used without issues. Recheck cholesterol levels with a cholesterol panel. Deviated septum A deviated septum with associated nasal congestion was noted, with a history of nasal fractures. Recommend sinus rinses to manage symptoms. Chronic kidney disease (CKD), active medical management without dialysis, stage 3 (moderate) (HCC)    Diagnoses and all orders for this visit: Encounter for annual health examination -     CBC w/Diff -     Comp Met (CMET) -     Lipid panel -     PSA -     TSH Neoplasm of uncertain behavior of skin Dyspnea on exertion -     CT CARDIAC SCORING (SELF PAY ONLY); Future Primary hypertension -     losartan (COZAAR) 25 MG tablet; Take 1 tablet (25 mg total) by mouth daily. Prediabetes -     HgB A1c Hyperlipidemia, acquired Deviated septum Chronic kidney disease (CKD), active medical management without dialysis, stage  3 (moderate) (HCC)   Reviewed/updated/encouraged completion: Immunization History  Administered Date(s) Administered   Tdap 12/17/2019    Health Maintenance Due  Topic Date Due   Medicare Annual Wellness (AWV)  03/24/2023   Health Maintenance  Topic Date Due   Medicare Annual Wellness (AWV)  03/24/2023   INFLUENZA VACCINE  04/26/2023 (Originally 08/27/2022)   Colonoscopy  02/17/2026   DTaP/Tdap/Td (2 - Td or Tdap) 12/16/2029   HPV VACCINES  Aged Out   Pneumonia Vaccine 86+ Years old  Discontinued   COVID-19 Vaccine  Discontinued   Hepatitis C Screening  Discontinued   Zoster Vaccines- Shingrix  Discontinued     Today's Health Maintenance Counseling and Anticipatory Guidance:  Eye exams:  every 1-2 years Dental cleanings: every 6 months or more, brush/floss 3x daily Sinus Care: saline spray rinses daily Sleep: 8 hr nightly, good sleep hygiene, If unrestful, use e-monitoring Diet:  fruits/vegetables/fiber/healthy fats, balance and moderation Exercise:  150 minutes/weekly Discouraged any/all high risk behaviors  Today's Cancer Screening Shared Decision Making Discussions: Penile/Testicle/Scrotum: encouraged self-monitoring and reporting of genital abnormalities. Patient reports there are none. Thyroid:  checked and advised to check by palpating thyroid for nodules Prostate:  individualized risks/benefits/costs discussed ( Lab Results  Component Value Date   PSA 0.93 03/26/2023   PSA 1.02 10/01/2022   PSA 1.11 03/31/2022  ) Colon:  UpToDate  Lung:  he agreed to CT coronary calcium score and not LDCT  Skin: Advised regular sunscreen use. He denies worrisome, changing, or new skin lesions. Offered to include images in chart for surveillance. Showed him pictures of melanomas for reference to educate for self-monitoring. See images of his skin Other: discussed lack of screening guidelines and insurance coverage for other types.     Subjective  HPI Discussed the use of AI scribe software for clinical note transcription with the patient, who gave verbal consent to proceed.  History of Present Illness Eric Crane is a 68 year old male who presents for an annual physical exam.  He experiences shortness of breath, particularly when leaning over in the shower or waking up at night to use the bathroom. He describes needing to 'lean on the wall to catch my breath.'  He has a history of high blood pressure, which has been elevated recently. No alcohol consumption or use of ibuprofen or Aleve. He is currently on cholesterol medication, which was adjusted previously due to leg cramps, and he reports no issues with the current medication. He recalls being told he was prediabetic in the past and is interested in monitoring this condition.  He underwent knee replacement surgery in January of the previous year and has since lost over thirty pounds. He is concerned about a slight increase in abdominal adiposity and is aware of his diet being 'carb heavy.' He exercises regularly, playing ice hockey three times a week and rowing four times a week for thirty-five minutes. He acknowledges a weakness for coffee, sweets, and carbohydrates.  He has a history of a deviated septum and experiences frequent nasal congestion, which he attributes to past nasal fractures. He has not been performing sinus rinses. He quit smoking in 1996 and denies any current smoking habits. No throat problems, hoarseness, cough, or throat clearing.    Review of Systems  Constitutional:  Negative for chills, diaphoresis, fever, malaise/fatigue and weight loss.  HENT:  Positive for congestion. Negative for ear discharge, ear pain, hearing loss, nosebleeds, sinus pain, sore throat and tinnitus.   Eyes:  Negative  for blurred vision, double vision, photophobia, pain, discharge and redness.  Respiratory:  Negative for cough, hemoptysis, sputum production, shortness of breath, wheezing and stridor.   Cardiovascular:  Negative for chest pain, palpitations, orthopnea, claudication, leg swelling and PND.  Gastrointestinal:  Negative for abdominal  pain, blood in stool, constipation, diarrhea, heartburn, melena, nausea and vomiting.  Genitourinary:  Negative for dysuria, flank pain, frequency, hematuria and urgency.  Musculoskeletal:  Negative for back pain, falls, joint pain, myalgias and neck pain.  Skin:  Negative for itching and rash.  Neurological:  Negative for dizziness, tingling, tremors, sensory change, speech change, focal weakness, seizures, loss of consciousness, weakness and headaches.  Endo/Heme/Allergies:  Negative for environmental allergies and polydipsia. Does not bruise/bleed easily.  Psychiatric/Behavioral:  Negative for depression, hallucinations, memory loss, substance abuse and suicidal ideas. The patient is not nervous/anxious and does not have insomnia.      Completed medication reconciliation: Current Outpatient Medications on File Prior to Visit  Medication Sig   albuterol (VENTOLIN HFA) 108 (90 Base) MCG/ACT inhaler Inhale 2 puffs into the lungs every 6 (six) hours as needed for wheezing or shortness of breath.   amphetamine-dextroamphetamine (ADDERALL) 20 MG tablet Take 20 mg by mouth 3 (three) times daily.   DHEA 50 MG CAPS Take 100 mg by mouth 3 (three) times a week.   lamoTRIgine (LAMICTAL) 200 MG tablet Take 200 mg by mouth daily.   lovastatin (MEVACOR) 20 MG tablet TAKE 1 TABLET BY MOUTH AT BEDTIME   lurasidone (LATUDA) 80 MG TABS tablet Take 80 mg by mouth daily.   Misc Natural Products (GLUCOSAMINE CHOND MSM FORMULA PO) Take 1 tablet by mouth 3 (three) times a week.   Multiple Vitamins-Minerals (MULTIVITAMIN WITH MINERALS) tablet Take 1 tablet by mouth 3 (three) times a week.   QUEtiapine Fumarate (SEROQUEL XR) 150 MG 24 hr tablet Take 150 mg by mouth at bedtime.   No current facility-administered medications on file prior to visit.   Medications Discontinued During This Encounter  Medication Reason   Lurasidone HCl 60 MG TABS   The following were reviewed and/or entered/updated into our  electronic MEDICAL RECORD NUMBERPast Medical History:  Diagnosis Date   Bipolar 2 disorder (HCC) 11-30-2021   Depression    FH: heart disease Nov 30, 2021   Aunt died early 11s of some weird heart thing 3 aunts total died in 45s of heart problems Hasn't spoken to father since 1979 so doesn't know that history but knows they have high longevity.    Osteoarthritis of lower back 11/30/21   MRI 2021 Rarely ever hurts  T12- L1: Asymmetric leftward disc narrowing and bulging with endplate ridging. Asymmetric left facet spurring. Moderate left foraminal narrowing. Patent spinal canal. Bulky left far-lateral spur deforming the upper psoas.   L1-L2: Asymmetric leftward disc narrowing and endplate ridging with bulging. Moderate left foraminal narrowing. The canal is patent   L2-L3: Asymmet   Overweight November 30, 2021   Thrombocytopenia (HCC) 2021/11/30   Past Surgical History:  Procedure Laterality Date   COLONOSCOPY WITH PROPOFOL N/A 02/18/2016   Procedure: COLONOSCOPY WITH PROPOFOL;  Surgeon: Charolett Bumpers, MD;  Location: [facility];  Service: Endoscopy;  Laterality: N/A;   TONSILLECTOMY     removed as a child   TOTAL KNEE ARTHROPLASTY Right 02/02/2022   Procedure: RIGHT TOTAL KNEE ARTHROPLASTY;  Surgeon: Tarry Kos, MD;  Location: [facility] OR;  Service: Orthopedics;  Laterality: Right;   Social History   Socioeconomic History   Marital status: Divorced  Spouse name: Not on file   Number of children: 0   Years of education: Not on file   Highest education level: Not on file  Occupational History   Occupation: Cytogeneticist  Tobacco Use   Smoking status: Never   Smokeless tobacco: Never  Vaping Use   Vaping status: Never Used  Substance and Sexual Activity   Alcohol use: No   Drug use: No   Sexual activity: Not on file  Other Topics Concern   Not on file  Social History Narrative   Not on file   Social Drivers of Health   Financial Resource Strain: Not on file  Food Insecurity:  No Food Insecurity (02/02/2022)   Hunger Vital Sign    Worried About Running Out of Food in the Last Year: Never true    Ran Out of Food in the Last Year: Never true  Transportation Needs: No Transportation Needs (02/02/2022)   PRAPARE - Administrator, Civil Service (Medical): No    Lack of Transportation (Non-Medical): No  Physical Activity: Not on file  Stress: Not on file  Social Connections: Not on file  Intimate Partner Violence: Not At Risk (02/02/2022)   Humiliation, Afraid, Rape, and Kick questionnaire    Fear of Current or Ex-Partner: No    Emotionally Abused: No    Physically Abused: No    Sexually Abused: No   Family History  Problem Relation Age of Onset   Heart disease Mother   No Known Allergies Social History   Substance and Sexual Activity  Sexual Activity Not on file   Social History   Tobacco Use   Smoking status: Never   Smokeless tobacco: Never  Vaping Use   Vaping status: Never Used  Substance Use Topics   Alcohol use: No   Drug use: No      03/26/2023    8:36 AM  Depression screen PHQ 2/9  Decreased Interest 0  Down, Depressed, Hopeless 0  PHQ - 2 Score 0    Objective  BP (!) 167/80   Pulse (!) 54   Temp 98.1 F (36.7 C) (Temporal)   Ht 5\' 7"  (1.702 m)   Wt 168 lb (76.2 kg)   SpO2 97%   BMI 26.31 kg/m  BP Readings from Last 3 Encounters:  03/26/23 (!) 167/80  03/31/22 134/68  03/23/22 126/74   Wt Readings from Last 10 Encounters:  03/26/23 168 lb (76.2 kg)  03/31/22 172 lb 3.2 oz (78.1 kg)  03/23/22 170 lb (77.1 kg)  02/02/22 175 lb (79.4 kg)  01/21/22 174 lb 11.2 oz (79.2 kg)  11/20/21 185 lb 12.8 oz (84.3 kg)  04/07/21 185 lb (83.9 kg)  03/10/21 185 lb 9.6 oz (84.2 kg)  12/17/19 175 lb (79.4 kg)  10/03/19 183 lb (83 kg)    Physical Exam Physical Exam HEENT: Deviated septum. Back pressure on right eardrum. Normal oropharynx. Pharynx normal. NECK: Fullness in left posterior neck and left chest wall and he is left  handed so this is felt to just be asymmetric muscle growth HEENT: Tympanic membranes normal appearing bilaterally, oropharynx clear, no thyromegaly noted, no palpable lymphadenopathy or thyroid nodules. CARDIOVASCULAR: S1 and S2 heart sounds with regular rate and rhythm, no murmurs appreciated. PULMONARY: Normal work of breathing, clear to auscultation bilaterally, no crackles, wheezes, or rhonchi. ABDOMEN: Soft, nontender, nondistended. MSK: No edema, cyanosis, or clubbing noted. SKIN: Warm, dry, no lesions of concern observed. NEUROLOGICAL: Cranial nerves II-XII grossly intact, strength 5/5  in upper and lower extremities, reflexes symmetric and intact bilaterally. PSYCH: Normal affect and thought content, pleasant and cooperative.   Results Reviewed: Last depression screening scores    03/26/2023    8:36 AM 03/23/2022    9:53 AM 11/20/2021    8:51 AM  PHQ 2/9 Scores  PHQ - 2 Score 0 0 0   Last fall risk screening    03/26/2023    8:36 AM  Fall Risk   Falls in the past year? 0  Number falls in past yr: 0  Injury with Fall? 0  Risk for fall due to : No Fall Risks  Follow up Falls evaluation completed   Last CBC Lab Results  Component Value Date   WBC 4.2 03/26/2023   HGB 15.2 03/26/2023   HCT 46.0 03/26/2023   MCV 90.0 03/26/2023   MCH 29.3 02/03/2022   RDW 14.3 03/26/2023   PLT 144.0 (L) 03/26/2023   Last metabolic panel Lab Results  Component Value Date   GLUCOSE 108 (H) 03/26/2023   NA 139 03/26/2023   K 4.4 03/26/2023   CL 106 03/26/2023   CO2 27 03/26/2023   BUN 22 03/26/2023   CREATININE 1.37 03/26/2023   GFR 53.88 (L) 03/26/2023   CALCIUM 9.2 03/26/2023   PROT 6.5 03/26/2023   ALBUMIN 4.2 03/26/2023   BILITOT 0.4 03/26/2023   ALKPHOS 100 03/26/2023   AST 18 03/26/2023   ALT 20 03/26/2023   ANIONGAP 11 01/21/2022   Last lipids Lab Results  Component Value Date   CHOL 190 03/26/2023   HDL 51.70 03/26/2023   LDLCALC 124 (H) 03/26/2023   TRIG 72.0  03/26/2023   CHOLHDL 4 03/26/2023   Last hemoglobin A1c Lab Results  Component Value Date   HGBA1C 6.1 03/26/2023   Last thyroid functions Lab Results  Component Value Date   TSH 0.98 03/26/2023   Last vitamin D No results found for: "25OHVITD2", "25OHVITD3", "VD25OH" Last vitamin B12 and Folate No results found for: "VITAMINB12", "FOLATE"    Lab Results  Component Value Date/Time   GFR 53.88 (L) 03/26/2023 09:19 AM   GFR 61.76 03/23/2022 10:26 AM     ======================================  Notes:  This document was synthesized by artificial intelligence ([employer]) using HIPAA-compliant recording of the clinical interaction;   We discussed the use of AI scribe software for clinical note transcription with the patient, who gave verbal consent to proceed.    This encounter employed state-of-the-art, real-time, collaborative documentation. The patient was empowered to actively review and assist in updating their electronic medical record on a shared monitor, ensuring transparency and improving accuracy.    Prior to and at the beginning of Comprehensive Physical Exam (CPE) preventive care annual visit appointment types  we clarify to patients "Our goal today is to focus on your preventive or annual Comprehensive Physical Exam (CPE) preventive care annual visit, which typically covers routine screenings and overall health maintenance. However, if you share any new or concerning symptoms--such as dizziness, passing out, severe pain, or anything else that may point to a more serious issue--we are both legally and ethically required to evaluate it. We cannot simply overlook or ignore such concerns, even if you later decide you don't want to discuss them, because it could jeopardize your health.  If addressing a new concern takes [place] beyond the scope of the preventive visit, we may need to bill separately for that portion of care. We understand financial considerations are important, and we're  happy to discuss  your options if something new comes up. However, we want to be clear that once you mention a potentially serious issue, we must investigate it; we can't ethically or legally exclude that from our records or our evaluation. Please let us know all of your questions or worries. Together, we can decide how best to manage them and how to minimize any unexpected costs, but we want to keep you safe above all else."   This disclosure is mandated by professional ethics and legal obligations, as healthcare providers must address any substantial health concerns raised during any patient interaction and a comprehensive ROS is required by insurance companies for billing preventive-care visit type.    Signed: Lula Olszewski, MD  [facility] at [facility] [address] [place], [place] [address] Office:  [phone]

## 2023-03-27 ENCOUNTER — Other Ambulatory Visit: Payer: Self-pay | Admitting: Internal Medicine

## 2023-03-27 DIAGNOSIS — L989 Disorder of the skin and subcutaneous tissue, unspecified: Secondary | ICD-10-CM

## 2023-03-27 NOTE — Assessment & Plan Note (Signed)
 A slight increase in abdominal fat was noted. Check A1c levels with an A1c test.

## 2023-03-27 NOTE — Patient Instructions (Addendum)
 VISIT SUMMARY:  Today, we conducted your annual physical exam. We discussed your shortness of breath, high blood pressure, cholesterol levels, prediabetes, and nasal congestion. We also reviewed your exercise routine and diet. Routine blood work was ordered, and we discussed the possibility of a full-body MRI for comprehensive cancer screening.  YOUR PLAN:  -ANNUAL PHYSICAL EXAM: Your routine annual physical exam showed no acute issues. The fullness in your left posterior neck is likely due to muscle growth from rowing. We discussed a full-body MRI for cancer screening, which is not covered by insurance. Routine blood work was ordered to check cholesterol, liver, kidney, prostate cancer, thyroid cancer, and diabetes. We recommend sinus rinses for your nasal congestion. Your next annual physical is scheduled in 12 months.  -SHORTNESS OF BREATH: Your shortness of breath when leaning over and standing up may be related to high blood pressure. We will conduct a CT scan to investigate further.  -HYPERTENSION: High blood pressure can contribute to shortness of breath. You will start on a low dose of antihypertensive medication and monitor your blood pressure at home. Please bring your readings to the follow-up appointment in 2 weeks.  -HYPERLIPIDEMIA: Your cholesterol levels will be rechecked with a cholesterol panel since your current medication is not causing any issues.  -PREDIABETES: Prediabetes means your blood sugar levels are higher than normal but not high enough to be classified as diabetes. We will check your A1c levels to monitor this condition.  -DEVIATED SEPTUM: A deviated septum can cause nasal congestion. We recommend using sinus rinses to help manage your symptoms.  -GENERAL HEALTH MAINTENANCE: You are physically active, playing ice hockey and rowing regularly. We recommend reducing your carbohydrate intake to improve your diet. We also discussed the option of a full-body MRI for  comprehensive cancer screening, though it is not covered by insurance.  INSTRUCTIONS:  Please follow up in 2 weeks for blood pressure and CT scan results. Schedule a Medicare preventive exam (annual wellness visit) in 6 months and your next annual physical in 12 months.   Building Your Long-Term Health Plan  During today's preventive visit, we covered a variety of important health checks to help you stay on top of your well-being.  We also discussed strategies to maintain your health and identified some areas that might benefit from further exploration.   Preventive care visits like today's are designed to be proactive, but sometimes additional attention may be needed.  Rest assured, we're here for you.  If these areas require further evaluation or management, we'd be happy to schedule a separate, focused appointment to address them in detail.  Addressing Next Steps  [x]   Follow-up Visit: To ensure we address any unresolved issues and continue monitoring your overall health, we recommend scheduling a follow-up appointment in 1 year for your next preventive care visit. If you experience any new problems, need to discuss any medical concerns, or your condition worsens before then, please don't hesitate to call our office to schedule an appointment or seek emergency care as needed.  [x]   Preventive Measures: Maintaining healthy habits plays a crucial role in overall wellness. We recommend considering these tips: [x]   Regular appointments with dental and vision professionals [x]   Nightly nasal saline mist to keep sinuses clear [x]   Consistent toothbrushing to maintain oral health [x]   Using an app like [employer]ore[employer] to track sleep quality [x]   Routine checks of blood pressure and heart rate [x]   Medical Information: In some instances, we may require additional medical information  from other providers to create a comprehensive picture of your health. If applicable, we can provide a medical information  release form at the front desk for you to sign, allowing [place] to gather these records. [x]   Lab Tests: If any lab tests were ordered today, scheduling them within a week of your visit helps ensure the best possible insurance coverage.  Planning Follow Up to Work on a Problem? Make the Most of Our Focused (20 minute) Appointments  [x]   Clearly state your top concerns at the beginning of the visit to focus our discussion [x]   If you anticipate you will need more time, please inform the front desk during scheduling - we can book multiple appointments in the same week. [x]   If you have transportation problems- use our convenient video appointments or ask about transportation support. [x]   We can get down to business faster if you use MyChart to update information before the visit and submit non-urgent questions before your visit. Thank you for taking the time to provide details through MyChart.  Let our nurse know and she can import this information into your encounter documents.  Arrival and Wait Times  [x]   Arriving on time ensures that everyone receives prompt attention. [x]   Early morning (8a) and afternoon (1p) appointments tend to have shortest wait times. [x]   Unfortunately, we cannot delay appointments for late arrivals or hold slots during phone calls.  Bring to Your Next Appointment:  [x]   Medications: Please bring all your medication bottles to your next appointment to ensure we have an accurate record of your prescriptions. [x]   Health Diaries: If you're monitoring any health conditions at home, keeping a diary of your readings can be very helpful for discussions at your next appointment.  Reviewing Your Records  [x]   Review your attached preventive care information at the end of these patient instructions. [x]   Review this early draft of your clinical encounter notes below and the final encounter summary tomorrow on MyChart after its been completed.      Getting Answers and  Following Up  [x]   Simple Questions & Concerns: For quick questions or basic follow-up after your visit, reach [place] at [phone] or MyChart messaging. [x]   Complex Concerns: If your concern is more complex, scheduling an appointment might be best. Discuss this with the staff to find the most suitable option. [x]   Lab & Imaging Results: We'll contact you directly if results are abnormal or you don't use MyChart. Most normal results will be on MyChart within 2-3 business days, with a review message from Dr. Jon Billings. Haven't heard back in 2 weeks? Need results sooner? Contact us at [phone]. [x]   Referrals: Our referral coordinator will manage specialist referrals. The specialist's office should contact you within 2 weeks to schedule an appointment. Call us if you haven't heard from them after 2 weeks.  Staying Connected  [x]   MyChart: Activate your MyChart for the fastest way to access results and message [place]. See the last page of this paperwork for instructions on how to activate.  Billing  [x]   X-ray & Lab Orders: These are billed by separate companies. Contact the invoicing company directly for questions or concerns. [x]   Visit Charges: Discuss any billing inquiries with our administrative services team.  Your Satisfaction Matters  [x]   Share Your Experience: We strive for your satisfaction! If you have any complaints, or preferably compliments, please let Dr. Jon Billings know directly or contact our Practice Administrators, Edwena Felty or [employer],  by asking at the front desk.                 Next Steps  [x]   Schedule Follow-Up:  We recommend a follow-up appointment in 1 year for your next wellness visit.  If you develop any new problems, want to address any medical issues, or your condition worsens before then, please call us for an appointment or seek emergency care. [x]   Preventive Care:  Make sure to keep regular appointments with dental and vision  professionals, use nightly nasal saline mist sprays to keep your sinuses clear and toothbrushing to protect your teeth. Use [employer] App or other app to track your sleep quality. Check blood pressure and heart rate routinely. [x]   Medical Information Release:  For any relevant medical information we don't have, please sign a release form at the front desk so we can obtain it for your records. [x]   Lab Tests:  Schedule any lab tests from today for within a week to ensure best insurance coverage.    Making the Most of Our Focused (20 minute) Appointments:  [x]   Clearly state your top concerns at the beginning of the visit to focus our discussion [x]   If you anticipate you will need more time, please inform the front desk during scheduling - we can book multiple appointments in the same week. [x]   If you have transportation problems- use our convenient video appointments or ask about transportation support. [x]   We can get down to business faster if you use MyChart to update information before the visit and submit non-urgent questions before your visit. Thank you for taking the time to provide details through MyChart.  Let our nurse know and she can import this information into your encounter documents.  Arrival and Wait Times: [x]   Arriving on time ensures that everyone receives prompt attention. [x]   Early morning (8a) and afternoon (1p) appointments tend to have shortest wait times. [x]   Unfortunately, we cannot delay appointments for late arrivals or hold slots during phone calls.  Bring to Your Next Appointment  [x]   Medications: Please bring all your medication bottles to your next appointment to ensure we have an accurate record of your prescriptions. [x]   Health Diaries: If you're monitoring any health conditions at home, keeping a diary of your readings can be very helpful for discussions at your next appointment.  Reviewing Your Records  [x]   Review your attached preventive care  information at the end of these patient instructions. [x]   Review this early draft of your clinical encounter notes below and the final encounter summary tomorrow on MyChart after its been completed.   Encounter for annual health examination -     CBC with Differential/Platelet -     Comprehensive metabolic panel -     Lipid panel -     PSA -     TSH  Neoplasm of uncertain behavior of skin  Dyspnea on exertion -     CT CARDIAC SCORING (SELF PAY ONLY); Future  Primary hypertension -     Losartan Potassium; Take 1 tablet (25 mg total) by mouth daily.  Dispense: 90 tablet; Refill: 3  Prediabetes Assessment & Plan: A slight increase in abdominal fat was noted. Check A1c levels with an A1c test.  Orders: -     Hemoglobin A1c  Hyperlipidemia, acquired Assessment & Plan: A previous statin caused leg cramps, so a different medication was used without issues. Recheck cholesterol levels with a cholesterol panel.   Deviated  septum  Chronic kidney disease (CKD), active medical management without dialysis, stage 3 (moderate) (HCC)     Getting Answers and Following Up  [x]   Simple Questions & Concerns: For quick questions or basic follow-up after your visit, reach [place] at [phone] or MyChart messaging. [x]   Complex Concerns: If your concern is more complex, scheduling an appointment might be best. Discuss this with the staff to find the most suitable option. [x]   Lab & Imaging Results: We'll contact you directly if results are abnormal or you don't use MyChart. Most normal results will be on MyChart within 2-3 business days, with a review message from Dr. Jon Billings. Haven't heard back in 2 weeks? Need results sooner? Contact us at [phone]. [x]   Referrals: Our referral coordinator will manage specialist referrals. The specialist's office should contact you within 2 weeks to schedule an appointment. Call us if you haven't heard from them after 2 weeks.  Staying Connected  [x]    MyChart: Activate your MyChart for the fastest way to access results and message [place]. See the last page of this paperwork for instructions on how to activate.  Billing  [x]   X-ray & Lab Orders: These are billed by separate companies. Contact the invoicing company directly for questions or concerns. [x]   Visit Charges: Discuss any billing inquiries with our administrative services team.  Your Satisfaction Matters  [x]   Share Your Experience: We strive for your satisfaction! If you have any complaints, or preferably compliments, please let Dr. Jon Billings know directly or contact our Practice Administrators, Edwena Felty or [employer], by asking at the front desk.

## 2023-03-27 NOTE — Progress Notes (Signed)
 Marland Kitchen

## 2023-03-27 NOTE — Assessment & Plan Note (Signed)
 A previous statin caused leg cramps, so a different medication was used without issues. Recheck cholesterol levels with a cholesterol panel.

## 2023-03-28 ENCOUNTER — Encounter: Payer: Self-pay | Admitting: Internal Medicine

## 2023-03-28 DIAGNOSIS — N1832 Chronic kidney disease, stage 3b: Secondary | ICD-10-CM | POA: Insufficient documentation

## 2023-03-28 DIAGNOSIS — N183 Chronic kidney disease, stage 3 unspecified: Secondary | ICD-10-CM | POA: Insufficient documentation

## 2023-03-28 DIAGNOSIS — I10 Essential (primary) hypertension: Secondary | ICD-10-CM | POA: Insufficient documentation

## 2023-03-29 ENCOUNTER — Other Ambulatory Visit: Payer: Self-pay

## 2023-03-29 DIAGNOSIS — R944 Abnormal results of kidney function studies: Secondary | ICD-10-CM

## 2023-03-29 DIAGNOSIS — N183 Chronic kidney disease, stage 3 unspecified: Secondary | ICD-10-CM

## 2023-03-29 NOTE — Telephone Encounter (Signed)
 Patient's review of lab results/notes confirmed. Placed Nephrology referral.

## 2023-04-06 ENCOUNTER — Encounter (HOSPITAL_COMMUNITY): Payer: Self-pay

## 2023-04-06 ENCOUNTER — Ambulatory Visit (HOSPITAL_COMMUNITY)
Admission: RE | Admit: 2023-04-06 | Discharge: 2023-04-06 | Disposition: A | Discharge status: Home / Self Care | Payer: Self-pay | Source: Ambulatory Visit | Attending: Internal Medicine | Admitting: Internal Medicine

## 2023-04-06 DIAGNOSIS — R0609 Other forms of dyspnea: Secondary | ICD-10-CM | POA: Insufficient documentation

## 2023-04-08 ENCOUNTER — Encounter: Payer: Self-pay | Admitting: Internal Medicine

## 2023-04-08 DIAGNOSIS — I2510 Atherosclerotic heart disease of native coronary artery without angina pectoris: Secondary | ICD-10-CM | POA: Insufficient documentation

## 2023-04-08 NOTE — Progress Notes (Signed)
 Reviewed Coronary Calcium Score from 04/07/2023. Score of 109 (54th percentile) indicating moderate coronary calcification in left main and left anterior descending arteries. When combined with elevated blood pressure (167/80) and other risk factors, 10-year cardiovascular risk calculated at 23.2% (high). Increasing losartan to 50mg  daily to improve blood pressure control. Continue statin therapy which has effectively improved cholesterol levels. Nephrology referral initiated for chronic kidney disease management. See detailed Patient Message for complete information and management plan.

## 2023-04-09 ENCOUNTER — Ambulatory Visit (INDEPENDENT_AMBULATORY_CARE_PROVIDER_SITE_OTHER): Payer: Medicare HMO | Admitting: Internal Medicine

## 2023-04-09 ENCOUNTER — Encounter: Payer: Self-pay | Admitting: Internal Medicine

## 2023-04-09 VITALS — BP 122/68 | HR 72 | Temp 98.0°F | Ht 67.0 in | Wt 170.1 lb

## 2023-04-09 DIAGNOSIS — I10 Essential (primary) hypertension: Secondary | ICD-10-CM | POA: Diagnosis not present

## 2023-04-09 DIAGNOSIS — I2510 Atherosclerotic heart disease of native coronary artery without angina pectoris: Secondary | ICD-10-CM | POA: Diagnosis not present

## 2023-04-09 DIAGNOSIS — N179 Acute kidney failure, unspecified: Secondary | ICD-10-CM

## 2023-04-09 LAB — BASIC METABOLIC PANEL
BUN: 24 mg/dL — ABNORMAL HIGH (ref 6–23)
CO2: 26 meq/L (ref 19–32)
Calcium: 9.5 mg/dL (ref 8.4–10.5)
Chloride: 104 meq/L (ref 96–112)
Creatinine, Ser: 1.28 mg/dL (ref 0.40–1.50)
GFR: 58.44 mL/min — ABNORMAL LOW (ref >60.00)
Glucose, Bld: 75 mg/dL (ref 70–99)
Potassium: 4.4 meq/L (ref 3.5–5.1)
Sodium: 138 meq/L (ref 135–145)

## 2023-04-09 LAB — MICROALBUMIN / CREATININE URINE RATIO
Creatinine,U: 84.9 mg/dL
Microalb Creat Ratio: UNDETERMINED mg/g (ref 0.0–30.0)
Microalb, Ur: <0.7 mg/dL

## 2023-04-09 MED ORDER — HYDROCHLOROTHIAZIDE 25 MG PO TABS: 25.0000 mg | ORAL_TABLET | Freq: Every day | ORAL | 3 refills | Status: DC

## 2023-04-09 MED ORDER — ASPIRIN 81 MG PO TBEC: 81.0000 mg | DELAYED_RELEASE_TABLET | Freq: Every day | ORAL | Status: AC

## 2023-04-09 MED ORDER — LOVASTATIN 40 MG PO TABS: 40.0000 mg | ORAL_TABLET | Freq: Every day | ORAL | 3 refills | Status: DC

## 2023-04-09 MED ORDER — LOSARTAN POTASSIUM 100 MG PO TABS: 100.0000 mg | ORAL_TABLET | Freq: Every day | ORAL | 3 refills | Status: DC

## 2023-04-09 NOTE — Telephone Encounter (Signed)
 Patient's review of lab results/notes confirmed.

## 2023-04-09 NOTE — Assessment & Plan Note (Signed)
 Blood pressure remains consistently elevated, ranging from 150/86 to 163/95, with one anomaly at 178. Current Losartan 50 mg is insufficient. Poor sleep habits and an inconsistent schedule may contribute to this issue. Increase Losartan to 100 mg daily. Add hydrochlorothiazide if blood pressure stays above 140/90 after three days on the increased dose. Long term goal is 130/80 due to chronic kidney disease. Regularly monitor blood pressure. Encourage a consistent sleep schedule and mindfulness techniques to improve sleep quality. Order blood work to monitor kidney function due to potential medication impact.

## 2023-04-09 NOTE — Progress Notes (Signed)
 ==============================  [facility]: [phone]   -- Medical Office Visit --  Patient: Eric Crane      Age: 68 y.o.       Sex:  male  Date:   04/09/2023 Today's Healthcare Provider: Lula Olszewski, MD  ==============================    CHIEF COMPLAINT: 2 week follow-up (Discuss CT results.) and Hypertension  Background This is a 68 y.o. male who has Primary osteoarthritis of right knee; Weight disorder; FH: heart disease; Osteoarthritis of lower back; Hyperlipidemia, acquired; Thrombocytopenia (HCC); Attention deficit hyperactivity disorder (ADHD); Bipolar 2 disorder (HCC); H/O total knee replacement; Prediabetes; Statin intolerance; Status post total right knee replacement; Pain in left hip; Screening PSA (prostate specific antigen); Chronic kidney disease (CKD), active medical management without dialysis, stage 3 (moderate) (HCC); Hypertension; and Coronary artery calcification on their problem list.  History of Present Illness Eric Crane is a 68 year old male with hypertension and coronary artery disease who presents for blood pressure management.  He presents with home blood pressure readings consistently in the range of 150-160 mmHg systolic and 85-95 mmHg diastolic, with a noted spike to 178 mmHg. He is currently taking losartan 50 mg, increased from 25 mg after his last physical, and has been doubling up on his 25 mg tablets, nearing depletion. No side effects from current medications.  He has a history of coronary artery disease with a small amount of coronary calcium, contributing to a 23% risk of heart attack in the next ten years. He is currently taking lovastatin 20 mg for cholesterol management, which was noted to be slightly elevated.  He has a history of kidney disease, attributed to long-term ibuprofen use, now discontinued. Current kidney function is stable. He has switched from ibuprofen to Tylenol for pain  management and follows a low-salt diet.  He describes poor sleep quality, attributing it to lifelong habits from partying until age 22. He has difficulty falling asleep, prefers sleeping on the couch, and has tried sleep medications without success. An inconsistent work schedule and late-night hockey further disrupt sleep patterns.  No current alcohol consumption. He maintains a low-salt diet, not adding salt to his food, and engages in regular physical activity.   Visually reviewed that patient  has a past medical history of Bipolar 2 disorder (HCC) (11/20/2021), Depression, FH: heart disease (11/20/2021), Osteoarthritis of lower back (11/20/2021), Overweight (11/20/2021), and Thrombocytopenia (HCC) (11/20/2021).  Visually reviewed and manually updated: Current Outpatient Medications on File Prior to Visit  Medication Sig   albuterol (VENTOLIN HFA) 108 (90 Base) MCG/ACT inhaler Inhale 2 puffs into the lungs every 6 (six) hours as needed for wheezing or shortness of breath.   amphetamine-dextroamphetamine (ADDERALL) 20 MG tablet Take 20 mg by mouth 3 (three) times daily.   DHEA 50 MG CAPS Take 100 mg by mouth 3 (three) times a week.   lamoTRIgine (LAMICTAL) 200 MG tablet Take 200 mg by mouth daily.   lurasidone (LATUDA) 80 MG TABS tablet Take 80 mg by mouth daily.   Misc Natural Products (GLUCOSAMINE CHOND MSM FORMULA PO) Take 1 tablet by mouth 3 (three) times a week.   Multiple Vitamins-Minerals (MULTIVITAMIN WITH MINERALS) tablet Take 1 tablet by mouth 3 (three) times a week.   QUEtiapine Fumarate (SEROQUEL XR) 150 MG 24 hr tablet Take 150 mg by mouth at bedtime.   No current facility-administered medications on file prior to visit.   Medications Discontinued During This Encounter  Medication Reason   losartan (COZAAR) 25  MG tablet    lovastatin (MEVACOR) 20 MG tablet          04/09/2023    8:44 AM 03/26/2023    8:38 AM 03/26/2023    8:28 AM  Vitals with BMI  Height 5\' 7"   5\' 7"    Weight 170 lbs 2 oz  168 lbs  BMI 26.64  26.31  Systolic 122 167 962  Diastolic 68 80 79  Pulse 72  54   Wt Readings from Last 10 Encounters:  04/09/23 170 lb 1.6 oz (77.2 kg)  03/26/23 168 lb (76.2 kg)  03/31/22 172 lb 3.2 oz (78.1 kg)  03/23/22 170 lb (77.1 kg)  02/02/22 175 lb (79.4 kg)  01/21/22 174 lb 11.2 oz (79.2 kg)  11/20/21 185 lb 12.8 oz (84.3 kg)  04/07/21 185 lb (83.9 kg)  03/10/21 185 lb 9.6 oz (84.2 kg)  12/17/19 175 lb (79.4 kg)   Vital signs reviewed.  Nursing notes reviewed. Weight trend reviewed. Physical Exam  General Appearance:  No acute distress appreciable.   Well-groomed, healthy-appearing male.  Well proportioned with no abnormal fat distribution.  Good muscle tone. Pulmonary:  Normal work of breathing at rest, no respiratory distress apparent. SpO2: 100 %  Musculoskeletal: All extremities are intact.  Neurological:  Awake, alert, oriented, and engaged.  No obvious focal neurological deficits or cognitive impairments.  Sensorium seems unclouded.   Speech is clear and coherent with logical content. Psychiatric:  Appropriate mood, pleasant and cooperative demeanor, thoughtful and engaged during the exam}    No results found for any visits on 04/09/23. Office Visit on 03/26/2023  Component Date Value   WBC 03/26/2023 4.2    RBC 03/26/2023 5.10    Hemoglobin 03/26/2023 15.2    HCT 03/26/2023 46.0    MCV 03/26/2023 90.0    MCHC 03/26/2023 33.1    RDW 03/26/2023 14.3    Platelets 03/26/2023 144.0 (L)    Neutrophils Relative % 03/26/2023 49.8    Lymphocytes Relative 03/26/2023 26.9    Monocytes Relative 03/26/2023 14.6 (H)    Eosinophils Relative 03/26/2023 5.9 (H)    Basophils Relative 03/26/2023 2.8    Neutro Abs 03/26/2023 2.1    Lymphs Abs 03/26/2023 1.1    Monocytes Absolute 03/26/2023 0.6    Eosinophils Absolute 03/26/2023 0.3    Basophils Absolute 03/26/2023 0.1    Sodium 03/26/2023 139    Potassium 03/26/2023 4.4    Chloride 03/26/2023  106    CO2 03/26/2023 27    Glucose, Bld 03/26/2023 108 (H)    BUN 03/26/2023 22    Creatinine, Ser 03/26/2023 1.37    Total Bilirubin 03/26/2023 0.4    Alkaline Phosphatase 03/26/2023 100    AST 03/26/2023 18    ALT 03/26/2023 20    Total Protein 03/26/2023 6.5    Albumin 03/26/2023 4.2    GFR 03/26/2023 53.88 (L)    Calcium 03/26/2023 9.2    Cholesterol 03/26/2023 190    Triglycerides 03/26/2023 72.0    HDL 03/26/2023 51.70    VLDL 03/26/2023 14.4    LDL Cholesterol 03/26/2023 124 (H)    Total CHOL/HDL Ratio 03/26/2023 4    NonHDL 03/26/2023 138.36    PSA 03/26/2023 0.93    TSH 03/26/2023 0.98    Hgb A1c MFr Bld 03/26/2023 6.1   Lab on 10/01/2022  Component Date Value   Cholesterol 10/01/2022 188    Triglycerides 10/01/2022 97.0    HDL 10/01/2022 47.40    VLDL 10/01/2022 19.4  LDL Cholesterol 10/01/2022 121 (H)    Total CHOL/HDL Ratio 10/01/2022 4    NonHDL 10/01/2022 140.14    PSA 10/01/2022 1.02   No image results found. CT CARDIAC SCORING (SELF PAY ONLY) Result Date: 04/07/2023 CLINICAL DATA:  cardiovascular disease risk stratification EXAM: Coronary Calcium Score TECHNIQUE: A gated, non-contrast computed tomography scan of the heart was performed using 3mm slice thickness. Axial images were analyzed on a dedicated workstation. Calcium scoring of the coronary arteries was performed using the Agatston method. FINDINGS: Coronary Calcium Score: Left main: 53.6 Left anterior descending artery: 55.5 Left circumflex artery: 0 Right coronary artery: 0 Total: 109 Percentile: 54th Aortic atherosclerosis. Pericardium: Normal. Non-cardiac: See separate report from [facility]. IMPRESSION: 1. Coronary calcium score of 109. This was 54th percentile for age-, race-, and sex-matched controls. RECOMMENDATIONS: Coronary artery calcium (CAC) score is a strong predictor of incident coronary heart disease (CHD) and provides predictive information beyond traditional risk factors. CAC  scoring is reasonable to use in the decision to withhold, postpone, or initiate statin therapy in intermediate-risk or selected borderline-risk asymptomatic adults (age 24-75 years and LDL-C >=70 to <190 mg/dL) who do not have diabetes or established atherosclerotic cardiovascular disease (ASCVD).* In intermediate-risk (10-year ASCVD risk >=7.5% to <20%) adults or selected borderline-risk (10-year ASCVD risk >=5% to <7.5%) adults in whom a CAC score is measured for the purpose of making a treatment decision the following recommendations have been made: If CAC=0, it is reasonable to withhold statin therapy and reassess in 5 to 10 years, as long as higher risk conditions are absent (diabetes mellitus, family history of premature CHD in first degree relatives (males <55 years; females <65 years), cigarette smoking, or LDL >=190 mg/dL). If CAC is 1 to 99, it is reasonable to initiate statin therapy for patients >=1 years of age. If CAC is >=100 or >=75th percentile, it is reasonable to initiate statin therapy at any age. Cardiology referral should be considered for patients with CAC scores >=400 or >=75th percentile. *2018 AHA/ACC/AACVPR/AAPA/ABC/ACPM/ADA/AGS/APhA/ASPC/NLA/PCNA Guideline on the Management of Blood Cholesterol: A Report of the American College of Cardiology/American Heart Association Task Force on Clinical Practice Guidelines. J Am Coll Cardiol. 2019;73(24):3168-3209. Chilton Si, MD Electronically Signed   By: Chilton Si M.D.   On: 04/07/2023 16:28   XR Knee 1-2 Views Right Result Date: 01/28/2023 Well-seated prosthesis without complication         Assessment & Plan Primary hypertension Blood pressure remains consistently elevated, ranging from 150/86 to 163/95, with one anomaly at 178. Current Losartan 50 mg is insufficient. Poor sleep habits and an inconsistent schedule may contribute to this issue. Increase Losartan to 100 mg daily. Add hydrochlorothiazide if blood pressure  stays above 140/90 after three days on the increased dose. Long term goal is 130/80 due to chronic kidney disease. Regularly monitor blood pressure. Encourage a consistent sleep schedule and mindfulness techniques to improve sleep quality. Order blood work to monitor kidney function due to potential medication impact. Acute kidney injury (HCC) Chronic kidney disease likely stems from long-term ibuprofen use. Monitoring is planned due to potential impact from blood pressure medication. Awaiting a nephrology appointment, considered low priority due to mild kidney damage. Order cystatin C and BMP to assess kidney function. Regularly monitor kidney function, especially with changes in blood pressure medication. Follow up with nephrology as scheduled. Coronary artery calcification Moderate coronary calcification and elevated cholesterol levels result in a 23% ten-year heart attack risk. Discussed cholesterol management and cardiac evaluation benefits. Increase lovastatin  from 20 mg to 40 mg daily and recommend daily aspirin 81 mg. Refer to cardiology for further evaluation, including a potential stress test and echocardiogram. Encourage lifestyle modifications to manage cholesterol and blood pressure.  General Health Maintenance   Advised dietary modifications to reduce salt intake and avoid alcohol and ibuprofen. Encourage physical activity, such as playing hockey, with caution regarding cardiovascular health. Continue to avoid added salt, alcohol, and ibuprofen. Encourage regular physical activity with cardiovascular caution. Monitor cholesterol and blood pressure regularly.  Follow-up   Follow up with internal medicine in two weeks to reassess blood pressure control and review lab results. Schedule follow-up appointments with cardiology and nephrology as they become available.       Orders Placed During this Encounter:   Orders Placed This Encounter  Procedures   Basic Metabolic Panel (BMET)    Microalbumin / creatinine urine ratio    [place]   Cystatin C with Glomerular Filtration Rate, Estimated (eGFR)   Urinalysis w microscopic + reflex cultur   Ambulatory referral to Cardiology    Referral Priority:   Routine    Referral Type:   Consultation    Referral Reason:   Specialty Services Required    Number of Visits Requested:   1   Meds ordered this encounter  Medications   losartan (COZAAR) 100 MG tablet    Sig: Take 1 tablet (100 mg total) by mouth daily.    Dispense:  90 tablet    Refill:  3   hydrochlorothiazide (HYDRODIURIL) 25 MG tablet    Sig: Take 1 tablet (25 mg total) by mouth daily. Take in am, makes salty urine.  Only need if blood pressure average not below 140/90    Dispense:  90 tablet    Refill:  3   aspirin EC 81 MG tablet    Sig: Take 1 tablet (81 mg total) by mouth daily. Swallow whole.   lovastatin (MEVACOR) 40 MG tablet    Sig: Take 1 tablet (40 mg total) by mouth at bedtime.    Dispense:  90 tablet    Refill:  3             **This document was synthesized by artificial intelligence (Abridge) using HIPAA-compliant recording of the clinical interaction;   We discussed the use of AI scribe software for clinical note transcription with the patient, who gave verbal consent to proceed.    Additional Info: This encounter employed state-of-the-art, real-time, collaborative documentation. The patient actively reviewed and assisted in updating their electronic medical record on a shared screen, ensuring transparency and facilitating joint problem-solving for the problem list, overview, and plan. This approach promotes accurate, informed care. The treatment plan was discussed and reviewed in detail, including medication safety, potential side effects, and all patient questions. We confirmed understanding and comfort with the plan. Follow-up instructions were established, including contacting the office for any concerns, returning if symptoms worsen, persist,  or new symptoms develop, and precautions for potential emergency department visits.

## 2023-04-09 NOTE — Assessment & Plan Note (Signed)
 Moderate coronary calcification and elevated cholesterol levels result in a 23% ten-year heart attack risk. Discussed cholesterol management and cardiac evaluation benefits. Increase lovastatin from 20 mg to 40 mg daily and recommend daily aspirin 81 mg. Refer to cardiology for further evaluation, including a potential stress test and echocardiogram. Encourage lifestyle modifications to manage cholesterol and blood pressure.

## 2023-04-09 NOTE — Patient Instructions (Addendum)
 It was a pleasure seeing you today! Your health and satisfaction are our top priorities.  Glenetta Hew, MD  Your Providers PCP: Lula Olszewski, MD,  [phone]) Referring Provider: Lula Olszewski, MD,  [phone]) Care Team Provider: Tarry Kos, MD,  [phone]) Care Team Provider: Milagros Evener, MD,  [phone]) Care Team Provider: Persons, [place], [place],  [phone])  VISIT SUMMARY:  Today, we discussed your blood pressure management, coronary artery disease, and chronic kidney disease. We reviewed your current medications and made some adjustments to better control your conditions. We also talked about your sleep habits and general health maintenance.  YOUR PLAN:  -HYPERTENSION: Hypertension means high blood pressure. Your blood pressure readings at home are still high, so we are increasing your Losartan dose to 100 mg daily. If your blood pressure remains above 140/90 after three days, we will add hydrochlorothiazide. Please monitor your blood pressure regularly. Improving your sleep habits with a consistent schedule and mindfulness techniques may also help.  -CORONARY ARTERY DISEASE: Coronary artery disease involves the narrowing or blockage of the coronary arteries. Your cholesterol levels are slightly elevated, and you have a moderate amount of coronary calcium, which increases your risk of a heart attack. We are increasing your lovastatin dose to 40 mg daily and recommending a daily aspirin of 81 mg. You will be referred to a cardiologist for further evaluation, including a possible stress test and echocardiogram. Lifestyle changes to manage cholesterol and blood pressure are also encouraged.  -CHRONIC KIDNEY DISEASE: Chronic kidney disease means your kidneys are not functioning as well as they should. This is likely due to long-term use of ibuprofen, which you have now stopped. We will monitor your kidney function closely, especially with the changes in your blood  pressure medication. Blood tests for cystatin C and BMP will be ordered to assess your kidney function. Follow up with nephrology as scheduled.  -GENERAL HEALTH MAINTENANCE: For your overall health, continue to avoid added salt, alcohol, and ibuprofen. Engage in regular physical activity, such as playing hockey, but be cautious about your cardiovascular health. We will monitor your cholesterol and blood pressure regularly.  INSTRUCTIONS:  Please follow up with internal medicine in two weeks to reassess your blood pressure control and review lab results. Schedule follow-up appointments with cardiology and nephrology as they become available.   NEXT STEPS: [x]  Early Intervention: Schedule sooner appointment, call our on-call services, or go to emergency room if there is any significant Increase in pain or discomfort New or worsening symptoms Sudden or severe changes in your health [x]  Flexible Follow-Up: We recommend a No follow-ups on file. for optimal routine care. This allows for progress monitoring and treatment adjustments. [x]  Preventive Care: Schedule your annual preventive care visit! It's typically covered by insurance and helps identify potential health issues early. [x]  Lab & X-ray Appointments: Incomplete tests scheduled today, or call to schedule. X-rays: [facility] (M-F, 8:30am-noon or 1pm-5pm). [x]  Medical Information Release: Sign a release form at front desk to obtain relevant medical information we don't have.  MAKING THE MOST OF OUR FOCUSED 20 MINUTE APPOINTMENTS: [x]   Clearly state your top concerns at the beginning of the visit to focus our discussion [x]   If you anticipate you will need more time, please inform the front desk during scheduling - we can book multiple appointments in the same week. [x]   If you have transportation problems- use our convenient video appointments or ask about transportation support. [x]   We can get  down to business faster if  you use MyChart to update information before the visit and submit non-urgent questions before your visit. Thank you for taking the time to provide details through MyChart.  Let our nurse know and she can import this information into your encounter documents.  Arrival and Wait Times: [x]   Arriving on time ensures that everyone receives prompt attention. [x]   Early morning (8a) and afternoon (1p) appointments tend to have shortest wait times. [x]   Unfortunately, we cannot delay appointments for late arrivals or hold slots during phone calls.  Getting Answers and Following Up [x]   Simple Questions & Concerns: For quick questions or basic follow-up after your visit, reach [place] at [phone] or MyChart messaging. [x]   Complex Concerns: If your concern is more complex, scheduling an appointment might be best. Discuss this with the staff to find the most suitable option. [x]   Lab & Imaging Results: We'll contact you directly if results are abnormal or you don't use MyChart. Most normal results will be on MyChart within 2-3 business days, with a review message from Dr. Jon Billings. Haven't heard back in 2 weeks? Need results sooner? Contact us at [phone]. [x]   Referrals: Our referral coordinator will manage specialist referrals. The specialist's office should contact you within 2 weeks to schedule an appointment. Call us if you haven't heard from them after 2 weeks.  Staying Connected [x]   MyChart: Activate your MyChart for the fastest way to access results and message [place]. See the last page of this paperwork for instructions on how to activate.  Bring to Your Next Appointment [x]   Medications: Please bring all your medication bottles to your next appointment to ensure we have an accurate record of your prescriptions. [x]   Health Diaries: If you're monitoring any health conditions at home, keeping a diary of your readings can be very helpful for discussions at your next appointment.  Billing [x]    X-ray & Lab Orders: These are billed by separate companies. Contact the invoicing company directly for questions or concerns. [x]   Visit Charges: Discuss any billing inquiries with our administrative services team.  Your Satisfaction Matters [x]   Share Your Experience: We strive for your satisfaction! If you have any complaints, or preferably compliments, please let Dr. Jon Billings know directly or contact our Practice Administrators, Edwena Felty or [employer], by asking at the front desk.   Reviewing Your Records [x]   Review this early draft of your clinical encounter notes below and the final encounter summary tomorrow on MyChart after its been completed.  All orders placed so far are visible here: Primary hypertension -     Losartan Potassium; Take 1 tablet (100 mg total) by mouth daily.  Dispense: 90 tablet; Refill: 3 -     hydroCHLOROthiazide; Take 1 tablet (25 mg total) by mouth daily. Take in am, makes salty urine.  Only need if blood pressure average not below 140/90  Dispense: 90 tablet; Refill: 3 -     Basic metabolic panel -     Microalbumin / creatinine urine ratio  Acute kidney injury (HCC) -     Cystatin C with Glomerular Filtration Rate, Estimated (eGFR) -     Urinalysis w microscopic + reflex cultur  Coronary artery calcification -     Ambulatory referral to Cardiology -     Aspirin; Take 1 tablet (81 mg total) by mouth daily. Swallow whole. -     Lovastatin; Take 1 tablet (40 mg total) by mouth at bedtime.  Dispense:  90 tablet; Refill: 3

## 2023-04-10 LAB — URINALYSIS W MICROSCOPIC + REFLEX CULTURE
Bacteria, UA: NONE SEEN /HPF
Bilirubin Urine: NEGATIVE
Glucose, UA: NEGATIVE
Hgb urine dipstick: NEGATIVE
Hyaline Cast: NONE SEEN /LPF
Ketones, ur: NEGATIVE
Leukocyte Esterase: NEGATIVE
Nitrites, Initial: NEGATIVE
Protein, ur: NEGATIVE
RBC / HPF: NONE SEEN /HPF (ref 0–2)
Specific Gravity, Urine: 1.017 (ref 1.001–1.035)
Squamous Epithelial / HPF: NONE SEEN /HPF (ref <=5)
WBC, UA: NONE SEEN /HPF (ref 0–5)
pH: 6 (ref 5.0–8.0)

## 2023-04-10 LAB — CYSTATIN C WITH GLOMERULAR FILTRATION RATE, ESTIMATED (EGFR)
CYSTATIN C: 1.37 mg/L — ABNORMAL HIGH (ref 0.52–1.20)
eGFR: 50 mL/min/{1.73_m2} — ABNORMAL LOW (ref >=60)

## 2023-04-10 LAB — NO CULTURE INDICATED

## 2023-04-11 ENCOUNTER — Encounter: Payer: Self-pay | Admitting: Internal Medicine

## 2023-04-11 DIAGNOSIS — I1A0 Resistant hypertension: Secondary | ICD-10-CM

## 2023-04-11 NOTE — Progress Notes (Signed)
 Labs indicate no active damage and kidney function about the same as a year ago... this assures [place] that there isn't any active damage, probably not in over a year.

## 2023-04-12 NOTE — Telephone Encounter (Signed)
 Patient's review of lab results/notes confirmed.

## 2023-04-16 MED ORDER — AMLODIPINE BESYLATE 2.5 MG PO TABS: 2.5000 mg | ORAL_TABLET | Freq: Every day | ORAL | 3 refills | Status: DC

## 2023-04-16 NOTE — Addendum Note (Signed)
 Addended by: Lula Olszewski on: 04/16/2023 05:14 PM   Modules accepted: Orders

## 2023-04-16 NOTE — Telephone Encounter (Signed)
 MyChart secure digital messaging clinical encounter Chief Complaint: Persistent hypertension despite current medication regimen. Patient reports "My blood pressure is not coming down. Averaging 155 over 86." Relevant History: 68 year old male with history of hypertension, coronary artery disease (CAC score 109, 54th percentile), and chronic kidney disease (GFR 53.88) Current medication: losartan 50mg  daily (increased from 25mg  after last visit) Home BP readings consistently 150-160/85-95 mmHg with noted spike to 178 mmHg Recent office BP on 04/09/2023: 122/68, previously 167/80 and 174/79 on 03/26/2023 LDL cholesterol elevated at 124 mg/dL (2/95/6213) History of kidney disease attributed to long-term ibuprofen use, now discontinued Sleep disturbance with difficulty falling asleep, inconsistent work schedule Taking multiple psychiatric medications including lamoTRIgine, lurasidone, and QUEtiapine Relevant PMH: Bipolar 2 disorder, depression, osteoarthritis, thrombocytopenia Assessment: Essential hypertension (I10) - Inadequately controlled on current dose of losartan 50mg . Patient's home readings consistently elevated despite medication adherence. Office readings variable but confirm hypertension diagnosis. Target BP goal of <130/80 mmHg appropriate given comorbid kidney disease and CAD. Chronic kidney disease, stage 3a (N18.3) - Stable but requires monitoring, especially with antihypertensive medication adjustments. Current GFR 53.88 mL/min. Coronary artery disease (I25.10) - Confirmed by recent CAC score of 109 (54th percentile). LDL cholesterol remains elevated at 124 mg/dL despite statin therapy. ASCVD risk calculated at 23% over 10 years. Hyperlipidemia (E78.5) - LDL consistently above goal at 124 mg/dL. Prior reading in September 2024 was 121 mg/dL. Insomnia (G47.00) - Chronic sleep disruption likely contributing to hypertension. Multiple factors including psychiatric conditions, work schedule,  and possible sleep hygiene issues. Plan: Hypertension Management:  Increase losartan to 100mg  daily Order comprehensive metabolic panel to monitor renal function with medication adjustment If BP remains >140/90 after 3 days on increased dose, consider adding hydrochlorothiazide 12.5mg  Continue home BP monitoring daily Target BP <130/80 mmHg per 2017 ACC/AHA guidelines for patients with CKD and CAD Recommended lifestyle modifications including sodium restriction, regular exercise, stress management Kidney Disease Management:  Order BMP and cystatin C to reassess kidney function Referral to nephrology for consultation Caution regarding NSAIDs and other nephrotoxic medications Coronary Artery Disease Management:  Increase lovastatin from 20mg  to 40mg  daily Add aspirin 81mg  daily for secondary prevention Referral to cardiology for comprehensive evaluation Target LDL <70 mg/dL per 0865 ACC/AHA cholesterol guidelines for patients with established ASCVD Sleep Management:  Encouraged sleep hygiene practices and more consistent sleep schedule Recommended mindfulness techniques before bedtime Patient Education:  Explained relationship between hypertension, CKD, and CAD Reviewed target BP goals and importance of medication adherence Discussed emergency warning signs requiring immediate medical attention Emphasized importance of lifestyle modifications Follow-up:  Follow up in MyChart in one week to assess BP response to increased medication In-person follow-up in 2 weeks to reassess BP control and review lab results Cardiology and nephrology appointments to be scheduled Medical Decision Making: High complexity due to: Multiple chronic conditions with significant risk of morbidity Management of medication requiring careful titration with monitoring for adverse effects Comprehensive risk assessment for cardiovascular disease Coordination of care between multiple specialties Interpretation of  diagnostic findings including CAC score and laboratory results Current Medical Guidelines Reference: Per 2017 ACC/AHA Hypertension Guidelines, patients with chronic kidney disease should maintain BP <130/80 mmHg. For patients with CAD and hyperlipidemia, 2018 ACC/AHA Cholesterol Guidelines recommend high-intensity statin therapy with target LDL <70 mg/dL for secondary prevention. ([employer].[email or web address]) Please see the MyChart message reply(ies) for my assessment and plan. This patient gave consent for this Medical Advice Message and is aware that it may result in a bill to [employer], as  well as the possibility of receiving a bill for a co-payment or deductible. They are an established patient, but are not seeking medical advice exclusively about a problem treated during an in person or video visit in the last seven days. I did not recommend an in person or video visit within seven days of my reply. I spent a total of 20 minutes cumulative time within 7 days through [employer]. Lula Olszewski, MD

## 2023-04-19 ENCOUNTER — Other Ambulatory Visit: Payer: Self-pay

## 2023-04-19 ENCOUNTER — Other Ambulatory Visit

## 2023-04-19 DIAGNOSIS — I10 Essential (primary) hypertension: Secondary | ICD-10-CM | POA: Diagnosis not present

## 2023-04-19 DIAGNOSIS — N183 Chronic kidney disease, stage 3 unspecified: Secondary | ICD-10-CM | POA: Diagnosis not present

## 2023-04-19 NOTE — Addendum Note (Signed)
 Addended by: Lorn Junes on: 04/19/2023 02:56 PM   Modules accepted: Orders

## 2023-04-20 LAB — BASIC METABOLIC PANEL
BUN/Creatinine Ratio: 19 (calc) (ref 6–22)
BUN: 31 mg/dL — ABNORMAL HIGH (ref 7–25)
CO2: 21 mmol/L (ref 20–32)
Calcium: 9.5 mg/dL (ref 8.6–10.3)
Chloride: 99 mmol/L (ref 98–110)
Creat: 1.65 mg/dL — ABNORMAL HIGH (ref 0.70–1.35)
Glucose, Bld: 106 mg/dL — ABNORMAL HIGH (ref 65–99)
Potassium: 3.5 mmol/L (ref 3.5–5.3)
Sodium: 132 mmol/L — ABNORMAL LOW (ref 135–146)
eGFR: 46 mL/min/{1.73_m2} — ABNORMAL LOW (ref >=60)

## 2023-04-20 LAB — CYSTATIN C WITH GLOMERULAR FILTRATION RATE, ESTIMATED (EGFR)
CYSTATIN C: 1.48 mg/L — ABNORMAL HIGH (ref 0.52–1.20)
eGFR: 45 mL/min/{1.73_m2} — ABNORMAL LOW (ref >=60)

## 2023-04-21 ENCOUNTER — Encounter: Payer: Self-pay | Admitting: Internal Medicine

## 2023-04-21 NOTE — Telephone Encounter (Signed)
 Patient's review of lab results/notes confirmed.

## 2023-04-22 ENCOUNTER — Encounter: Payer: Self-pay | Admitting: Internal Medicine

## 2023-04-22 DIAGNOSIS — R7989 Other specified abnormal findings of blood chemistry: Secondary | ICD-10-CM | POA: Insufficient documentation

## 2023-04-22 NOTE — Telephone Encounter (Signed)
 MyChart secure digital messaging clinical encounter  Chief Complaint: Patient inquiring about lab results showing decreased kidney function, reporting occasional dizziness, palpitations ("flutter" in chest), and asking about continuing to play hockey.  Relevant History: - 68 year old male with CKD Stage 3a (previously stable), hypertension - Recent losartan dose increase from 50mg  to 100mg  daily (approximately 10 days ago) - No history of cardiac arrhythmias documented in chart - Previously active, plays recreational hockey - Last in-person visit: 04/09/2023  Laboratory Results: - 04/19/2023: Creatinine 1.65 mg/dL (increased from 6.06), eGFR 46 mL/min (decreased from 50), BUN 31 mg/dL (elevated), Na 301 mmol/L (low) - 04/09/2023: Creatinine 1.28 mg/dL, eGFR 50 mL/min  Assessment: 1. Acute on chronic kidney injury - N17.9, N18.3    - Hemodynamic changes consistent with RAAS inhibition effect from losartan    - Creatinine increase (<30%) within acceptable range per KDIGO guidelines    - Mild hyponatremia likely secondary to losartan effect 2. Dizziness - R42    - Possibly related to hyponatremia and/or medication adjustment    - Requires further evaluation for cardiac or vestibular causes 3. Palpitations - R00.2    - New symptom, requires evaluation    - Possible relation to electrolyte imbalance  Plan: 1. Continue current losartan 100 mg daily dose with close monitoring, as per reviewed clinical interpretation 2. Schedule interim visit for BP check and repeat metabolic panel within 1-2 days 3. Continue scheduled appointment on 04/26/2023 for comprehensive evaluation 4. Nephrology referral to proceed as previously planned 5. Advised temporary restriction from hockey and other strenuous activities until kidney function stabilizes and dizziness is evaluated 6. Patient education provided regarding:    - Expected medication effects    - Fluid intake recommendations    - Sodium  restrictions    - Warning signs requiring emergency care    - Activity restrictions with rationale  Medical Decision Making: High complexity due to: - Acute change in kidney function requiring monitoring - New symptom evaluation (dizziness, palpitations) - Management of multiple chronic conditions with acute changes - Risk of significant morbidity without appropriate monitoring - Coordination with nephrology care  Current Guidelines Reference: KDIGO Clinical Practice Guideline for Acute Kidney Injury notes that increases in creatinine <30% after initiation or dose increase of RAAS inhibitors (like losartan) are often an expected hemodynamic effect rather than true kidney injury, and may not require dose reduction if clinically stable and monitored closely. This patient's creatinine increase of approximately 29% (1.28 to 1.65) falls within this range, supporting continued therapy with close monitoring.  Please see the MyChart message reply(ies) for my assessment and plan.   This patient gave consent for this Medical Advice Message and is aware that it may result in a bill to [employer], as well as the possibility of receiving a bill for a co-payment or deductible. They are an established patient, but are not seeking medical advice exclusively about a problem treated during an in person or video visit in the last seven days. I did not recommend an in person or video visit within seven days of my reply.    I spent a total of 20 minutes cumulative time within 7 days through [employer].  Activities performed during this time include: - Reviewing prior medical records and patient history: 5 minutes - Evaluating laboratory results and their clinical significance: 5 minutes - Clinical decision making and care planning: 5 minutes - Crafting a contextually relevant, evidence-based, and understandable MyChart message: 5 minutes  Lula Olszewski, MD

## 2023-04-23 ENCOUNTER — Other Ambulatory Visit: Payer: Self-pay

## 2023-04-23 ENCOUNTER — Other Ambulatory Visit (INDEPENDENT_AMBULATORY_CARE_PROVIDER_SITE_OTHER)

## 2023-04-23 DIAGNOSIS — N183 Chronic kidney disease, stage 3 unspecified: Secondary | ICD-10-CM | POA: Diagnosis not present

## 2023-04-23 DIAGNOSIS — I10 Essential (primary) hypertension: Secondary | ICD-10-CM

## 2023-04-23 LAB — BASIC METABOLIC PANEL WITH GFR
BUN: 28 mg/dL — ABNORMAL HIGH (ref 6–23)
CO2: 26 meq/L (ref 19–32)
Calcium: 9.4 mg/dL (ref 8.4–10.5)
Chloride: 101 meq/L (ref 96–112)
Creatinine, Ser: 1.55 mg/dL — ABNORMAL HIGH (ref 0.40–1.50)
GFR: 46.44 mL/min — ABNORMAL LOW (ref >60.00)
Glucose, Bld: 141 mg/dL — ABNORMAL HIGH (ref 70–99)
Potassium: 3.5 meq/L (ref 3.5–5.1)
Sodium: 139 meq/L (ref 135–145)

## 2023-04-23 NOTE — Telephone Encounter (Signed)
 Patient's review of lab results/notes confirmed. Ordered repeat BMP and Cystatin C with eGFR. Patient had a lab visit to have these labs done today.

## 2023-04-25 ENCOUNTER — Encounter: Payer: Self-pay | Admitting: Internal Medicine

## 2023-04-26 ENCOUNTER — Other Ambulatory Visit: Payer: Self-pay | Admitting: Internal Medicine

## 2023-04-26 ENCOUNTER — Encounter: Payer: Self-pay | Admitting: Internal Medicine

## 2023-04-26 ENCOUNTER — Ambulatory Visit (INDEPENDENT_AMBULATORY_CARE_PROVIDER_SITE_OTHER): Admitting: Internal Medicine

## 2023-04-26 VITALS — BP 138/72 | HR 62 | Temp 99.0°F | Ht 67.0 in | Wt 167.0 lb

## 2023-04-26 DIAGNOSIS — N179 Acute kidney failure, unspecified: Secondary | ICD-10-CM

## 2023-04-26 DIAGNOSIS — R809 Proteinuria, unspecified: Secondary | ICD-10-CM | POA: Diagnosis not present

## 2023-04-26 DIAGNOSIS — I10 Essential (primary) hypertension: Secondary | ICD-10-CM | POA: Diagnosis not present

## 2023-04-26 DIAGNOSIS — N1831 Chronic kidney disease, stage 3a: Secondary | ICD-10-CM

## 2023-04-26 DIAGNOSIS — I1A0 Resistant hypertension: Secondary | ICD-10-CM | POA: Diagnosis not present

## 2023-04-26 LAB — CYSTATIN C WITH GLOMERULAR FILTRATION RATE, ESTIMATED (EGFR)
CYSTATIN C: 1.52 mg/L — ABNORMAL HIGH (ref 0.52–1.20)
eGFR: 44 mL/min/{1.73_m2} — ABNORMAL LOW (ref >=60)

## 2023-04-26 MED ORDER — EMPAGLIFLOZIN 10 MG PO TABS: 10.0000 mg | ORAL_TABLET | Freq: Every day | ORAL | 3 refills | Status: DC

## 2023-04-26 MED ORDER — AMLODIPINE BESYLATE 5 MG PO TABS: 2.5000 mg | ORAL_TABLET | Freq: Two times a day (BID) | ORAL | 3 refills | Status: DC

## 2023-04-26 MED ORDER — LOSARTAN POTASSIUM 100 MG PO TABS: 50.0000 mg | ORAL_TABLET | Freq: Two times a day (BID) | ORAL | 3 refills | Status: DC

## 2023-04-26 NOTE — Assessment & Plan Note (Addendum)
 Assessment Evening hypertension with readings 140-160 mmHg despite multiple medications. Morning readings well-controlled in 120s/70s.  Plan Increase amlodipine to 5mg  daily, split as 2.5mg  twice daily Adjust losartan to 50mg  twice daily (every 12 hours) Target BP: 130/80 mmHg (temporary readings to 140 acceptable) Continue home BP monitoring

## 2023-04-26 NOTE — Patient Instructions (Addendum)
 YOUR CARE INSTRUCTIONS  IMPORTANT MEDICATION CHANGES:  Your blood pressure medications have been adjusted to better control your evening blood pressure  New medication (Jardiance) added to help protect your kidneys  New Medication Schedule  Morning  Amlodipine 2.5 mg (half of a 5 mg tablet)  Losartan 50 mg (half of a 100 mg tablet)  Jardiance 10 mg (take before breakfast)   Evening  Amlodipine 2.5 mg (half of a 5 mg tablet)  Losartan 50 mg (half of a 100 mg tablet)   Blood Pressure Monitoring: Check your blood pressure twice daily (morning and evening) Record all readings Target blood pressure: 130/80 or lower Dietary Guidelines: Include These Foods Limit These Foods   Avocados  Extra virgin olive oil  Fatty fish  [place] and chicken  Fresh vegetables   Processed foods  Added salt  Sugary foods and drinks  High-sodium packaged foods   Important Notes About Jardiance:  Take first thing in the morning before breakfast  You may need to urinate more frequently  Stay well hydrated throughout the day  Monitor for signs of dehydration (excessive thirst, dark urine, dizziness)  SEEK IMMEDIATE MEDICAL ATTENTION if you experience: Blood pressure above 180/100 Severe headache with confusion Chest pain or difficulty breathing Severe dizziness or fainting Signs of severe dehydration (extreme thirst, very dark urine, severe dizziness)    Follow-up Plan: Blood tests every 2 weeks to monitor kidney function Renal ultrasound as scheduled Nutrition consultation will be scheduled Next follow-up visit will be scheduled based on your lab results and blood pressure readings QUESTIONS OR CONCERNS?  Contact our office for: Consistent blood pressure readings above 140/90 Side effects from new medications Questions about your treatment plan Any other concerns about your health      It was a pleasure seeing you today! Your health and satisfaction are our top priorities.  Glenetta Hew,  MD  Your Providers PCP: Lula Olszewski, MD,  [phone]) Referring Provider: Lula Olszewski, MD,  [phone]) Care Team Provider: Tarry Kos, MD,  [phone]) Care Team Provider: Milagros Evener, MD,  [phone]) Care Team Provider: Persons, [place], [place],  [phone])     NEXT STEPS: [x]  Early Intervention: Schedule sooner appointment, call our on-call services, or go to emergency room if there is any significant Increase in pain or discomfort New or worsening symptoms Sudden or severe changes in your health [x]  Flexible Follow-Up: We recommend a No follow-ups on file. for optimal routine care. This allows for progress monitoring and treatment adjustments. [x]  Preventive Care: Schedule your annual preventive care visit! It's typically covered by insurance and helps identify potential health issues early. [x]  Lab & X-ray Appointments: Incomplete tests scheduled today, or call to schedule. X-rays: [facility] (M-F, 8:30am-noon or 1pm-5pm). [x]  Medical Information Release: Sign a release form at front desk to obtain relevant medical information we don't have.  MAKING THE MOST OF OUR FOCUSED 20 MINUTE APPOINTMENTS: [x]   Clearly state your top concerns at the beginning of the visit to focus our discussion [x]   If you anticipate you will need more time, please inform the front desk during scheduling - we can book multiple appointments in the same week. [x]   If you have transportation problems- use our convenient video appointments or ask about transportation support. [x]   We can get down to business faster if you use MyChart to update information before the visit and submit non-urgent questions before your visit. Thank you for taking the time to provide details  through MyChart.  Let our nurse know and she can import this information into your encounter documents.  Arrival and Wait Times: [x]   Arriving on time ensures that everyone receives prompt  attention. [x]   Early morning (8a) and afternoon (1p) appointments tend to have shortest wait times. [x]   Unfortunately, we cannot delay appointments for late arrivals or hold slots during phone calls.  Getting Answers and Following Up [x]   Simple Questions & Concerns: For quick questions or basic follow-up after your visit, reach [place] at [phone] or MyChart messaging. [x]   Complex Concerns: If your concern is more complex, scheduling an appointment might be best. Discuss this with the staff to find the most suitable option. [x]   Lab & Imaging Results: We'll contact you directly if results are abnormal or you don't use MyChart. Most normal results will be on MyChart within 2-3 business days, with a review message from Dr. Jon Billings. Haven't heard back in 2 weeks? Need results sooner? Contact us at [phone]. [x]   Referrals: Our referral coordinator will manage specialist referrals. The specialist's office should contact you within 2 weeks to schedule an appointment. Call us if you haven't heard from them after 2 weeks.  Staying Connected [x]   MyChart: Activate your MyChart for the fastest way to access results and message [place]. See the last page of this paperwork for instructions on how to activate.  Bring to Your Next Appointment [x]   Medications: Please bring all your medication bottles to your next appointment to ensure we have an accurate record of your prescriptions. [x]   Health Diaries: If you're monitoring any health conditions at home, keeping a diary of your readings can be very helpful for discussions at your next appointment.  Billing [x]   X-ray & Lab Orders: These are billed by separate companies. Contact the invoicing company directly for questions or concerns. [x]   Visit Charges: Discuss any billing inquiries with our administrative services team.  Your Satisfaction Matters [x]   Share Your Experience: We strive for your satisfaction! If you have any complaints, or preferably  compliments, please let Dr. Jon Billings know directly or contact our Practice Administrators, Edwena Felty or [employer], by asking at the front desk.   Reviewing Your Records [x]   Review this early draft of your clinical encounter notes below and the final encounter summary tomorrow on MyChart after its been completed.  All orders placed so far are visible here: Acute kidney injury superimposed on stage 3a chronic kidney disease (HCC) -     Empagliflozin; Take 1 tablet (10 mg total) by mouth daily before breakfast.  Dispense: 90 tablet; Refill: 3 -     Cystatin C with Glomerular Filtration Rate, Estimated (eGFR) -     Microalbumin / creatinine urine ratio; Future -     US RENAL; Future -     Protein / creatinine ratio, urine -     Amb ref to Medical Nutrition Therapy-MNT -     Basic metabolic panel with GFR; Standing  Resistant hypertension Assessment & Plan: Hypertension presents with elevated evening readings (140-160 mmHg) and normal morning readings (120s/70s). Current treatment includes losartan and amlodipine, with hydrochlorothiazide held. Medication timing and dosage will be adjusted to improve evening control. Amlodipine will be increased to 5 mg, taken as half a pill twice daily. Losartan will be taken 50 mg twice daily, every 12 hours. Regular blood pressure monitoring is advised. The target is 130/80 mmHg, with temporary readings of 140 mmHg acceptable.  Orders: -  amLODIPine Besylate; Take 0.5 tablets (2.5 mg total) by mouth in the morning and at bedtime.  Dispense: 90 tablet; Refill: 3  Primary hypertension Assessment & Plan: Hypertension presents with elevated evening readings (140-160 mmHg) and normal morning readings (120s/70s). Current treatment includes losartan and amlodipine, with hydrochlorothiazide held. Medication timing and dosage will be adjusted to improve evening control. Amlodipine will be increased to 5 mg, taken as half a pill twice daily. Losartan will be  taken 50 mg twice daily, every 12 hours. Regular blood pressure monitoring is advised. The target is 130/80 mmHg, with temporary readings of 140 mmHg acceptable.  Orders: -     Losartan Potassium; Take 0.5 tablets (50 mg total) by mouth 2 (two) times daily.  Dispense: 90 tablet; Refill: 3  Proteinuria, unspecified type -     Basic metabolic panel with GFR; Standing

## 2023-04-26 NOTE — Progress Notes (Signed)
 ==============================  [facility]: [id number]   -- Medical Office Visit --  Patient: Eric Crane      Age: 68 y.o.       Sex:  male  Date:   04/26/2023 Today's Healthcare Provider: Lula Olszewski, MD  ==============================   Chief Complaint: Hypertension (Pt states he is present due to elevated bp follow up. Discussion kidney issues.)  History of Present Illness Pleasant 68 year old male with hypertension and chronic kidney disease presenting for blood pressure management. Blood Pressure Pattern - Evening readings consistently elevated (140-160 mmHg systolic) - Morning readings typically 120s/70s - Recent evening readings: 150/84 mmHg and 159/87 mmHg four days ago   Current Medications - Amlodipine 2.5 mg nightly - Losartan 100 mg nightly - Hydrochlorothiazide recently held - Adderall taken in morning   Kidney Function - GFR decreased from 60 to 45 - Currently stage 3a CKD   Lifestyle/Diet: Reducing sugar intake Consuming healthy fats (peanut butter, avocados, olive oil) Prefers [place] and chicken as protein sources Avoids processed foods No added salt to meals Notable Observations: Takes all medications at night, which may contribute to evening BP spikes Notes blood pressure increases when not taking Adderall Compliant with current losartan dosing   Background: Reviewed: He has Primary osteoarthritis of right knee; Weight disorder; FH: heart disease; Osteoarthritis of lower back; Hyperlipidemia, acquired; Thrombocytopenia (HCC); Attention deficit hyperactivity disorder (ADHD); Bipolar 2 disorder (HCC); H/O total knee replacement; Prediabetes; Statin intolerance; Status post total right knee replacement; Pain in left hip; Screening PSA (prostate specific antigen); Chronic kidney disease (CKD), active medical management without dialysis, stage 3 (moderate) (HCC); Hypertension; Coronary artery calcification; and  Elevated serum creatinine on their problem list. Reviewed: He   has a past medical history of Bipolar 2 disorder (HCC) (11/20/2021), Depression, FH: heart disease (11/20/2021), Osteoarthritis of lower back (11/20/2021), Overweight (11/20/2021), and Thrombocytopenia (HCC) (11/20/2021).  Reviewed:  Allergies as of 04/26/2023   (No Known Allergies)    Medications: Reviewed: Current Outpatient Medications on File Prior to Visit  Medication Sig   albuterol (VENTOLIN HFA) 108 (90 Base) MCG/ACT inhaler Inhale 2 puffs into the lungs every 6 (six) hours as needed for wheezing or shortness of breath.   amphetamine-dextroamphetamine (ADDERALL) 20 MG tablet Take 20 mg by mouth 3 (three) times daily.   aspirin EC 81 MG tablet Take 1 tablet (81 mg total) by mouth daily. Swallow whole.   hydrochlorothiazide (HYDRODIURIL) 25 MG tablet Take 1 tablet (25 mg total) by mouth daily. Take in am, makes salty urine.  Only need if blood pressure average not below 140/90   lamoTRIgine (LAMICTAL) 200 MG tablet Take 200 mg by mouth daily.   lovastatin (MEVACOR) 40 MG tablet Take 1 tablet (40 mg total) by mouth at bedtime.   lurasidone (LATUDA) 80 MG TABS tablet Take 80 mg by mouth daily.   QUEtiapine Fumarate (SEROQUEL XR) 150 MG 24 hr tablet Take 300 mg by mouth at bedtime.   No current facility-administered medications on file prior to visit.   Medications Discontinued During This Encounter  Medication Reason   amLODipine (NORVASC) 2.5 MG tablet    losartan (COZAAR) 100 MG tablet    DHEA 50 MG CAPS Completed Course   Misc Natural Products (GLUCOSAMINE CHOND MSM FORMULA PO) Completed Course   Multiple Vitamins-Minerals (MULTIVITAMIN WITH MINERALS) tablet Completed Course       Physical Exam:    04/26/2023    2:22 PM 04/09/2023    8:44 AM  03/26/2023    8:38 AM  Vitals with BMI  Height 5\' 7"  5\' 7"    Weight 167 lbs 170 lbs 2 oz   BMI 26.15 26.64   Systolic 138 122 161  Diastolic 72 68 80  Pulse 62 72    Home blood pressure reviewed in HPI. Wt Readings from Last 10 Encounters:  04/26/23 167 lb (75.8 kg)  04/09/23 170 lb 1.6 oz (77.2 kg)  03/26/23 168 lb (76.2 kg)  03/31/22 172 lb 3.2 oz (78.1 kg)  03/23/22 170 lb (77.1 kg)  02/02/22 175 lb (79.4 kg)  01/21/22 174 lb 11.2 oz (79.2 kg)  11/20/21 185 lb 12.8 oz (84.3 kg)  04/07/21 185 lb (83.9 kg)  03/10/21 185 lb 9.6 oz (84.2 kg)  Vital signs reviewed.  Nursing notes reviewed. Weight trend reviewed. Physical Exam  Physical Exam General Appearance:  No acute distress appreciable.   Well-groomed, healthy-appearing male.  Well proportioned with no abnormal fat distribution.  Good muscle tone. Pulmonary:  Normal work of breathing at rest, no respiratory distress apparent. SpO2: 98 %  Musculoskeletal: All extremities are intact.  Neurological:  Awake, alert, oriented, and engaged.  No obvious focal neurological deficits or cognitive impairments.  Sensorium seems unclouded.   Speech is clear and coherent with logical content. Psychiatric:  Appropriate mood, pleasant and cooperative demeanor, thoughtful and engaged during the exam    Lab on 04/23/2023  Component Date Value   Sodium 04/23/2023 139    Potassium 04/23/2023 3.5    Chloride 04/23/2023 101    CO2 04/23/2023 26    Glucose, Bld 04/23/2023 141 (H)    BUN 04/23/2023 28 (H)    Creatinine, Ser 04/23/2023 1.55 (H)    GFR 04/23/2023 46.44 (L)    Calcium 04/23/2023 9.4    CYSTATIN C 04/23/2023 1.52 (H)    eGFR 04/23/2023 44 (L)   Lab on 04/19/2023  Component Date Value   Glucose, Bld 04/19/2023 106 (H)    BUN 04/19/2023 31 (H)    Creat 04/19/2023 1.65 (H)    eGFR 04/19/2023 46 (L)    BUN/Creatinine Ratio 04/19/2023 19    Sodium 04/19/2023 132 (L)    Potassium 04/19/2023 3.5    Chloride 04/19/2023 99    CO2 04/19/2023 21    Calcium 04/19/2023 9.5    CYSTATIN C 04/19/2023 1.48 (H)    eGFR 04/19/2023 45 (L)   Office Visit on 04/09/2023  Component Date Value   Sodium  04/09/2023 138    Potassium 04/09/2023 4.4    Chloride 04/09/2023 104    CO2 04/09/2023 26    Glucose, Bld 04/09/2023 75    BUN 04/09/2023 24 (H)    Creatinine, Ser 04/09/2023 1.28    GFR 04/09/2023 58.44 (L)    Calcium 04/09/2023 9.5    Microalb, Ur 04/09/2023 <0.7    Creatinine,U 04/09/2023 84.9    Microalb Creat Ratio 04/09/2023 Unable to calculate    CYSTATIN C 04/09/2023 1.37 (H)    eGFR 04/09/2023 50 (L)    Color, Urine 04/09/2023 YELLOW    APPearance 04/09/2023 CLEAR    Specific Gravity, Urine 04/09/2023 1.017    pH 04/09/2023 6.0    Glucose, UA 04/09/2023 NEGATIVE    Bilirubin Urine 04/09/2023 NEGATIVE    Ketones, ur 04/09/2023 NEGATIVE    Hgb urine dipstick 04/09/2023 NEGATIVE    Protein, ur 04/09/2023 NEGATIVE    Nitrites, Initial 04/09/2023 NEGATIVE    Leukocyte Esterase 04/09/2023 NEGATIVE    WBC, UA 04/09/2023 NONE  SEEN    RBC / HPF 04/09/2023 NONE SEEN    Squamous Epithelial / HPF 04/09/2023 NONE SEEN    Bacteria, UA 04/09/2023 NONE SEEN    Hyaline Cast 04/09/2023 NONE SEEN    Note 04/09/2023     Reflexve Urine Culture 04/09/2023    Office Visit on 03/26/2023  Component Date Value   WBC 03/26/2023 4.2    RBC 03/26/2023 5.10    Hemoglobin 03/26/2023 15.2    HCT 03/26/2023 46.0    MCV 03/26/2023 90.0    MCHC 03/26/2023 33.1    RDW 03/26/2023 14.3    Platelets 03/26/2023 144.0 (L)    Neutrophils Relative % 03/26/2023 49.8    Lymphocytes Relative 03/26/2023 26.9    Monocytes Relative 03/26/2023 14.6 (H)    Eosinophils Relative 03/26/2023 5.9 (H)    Basophils Relative 03/26/2023 2.8    Neutro Abs 03/26/2023 2.1    Lymphs Abs 03/26/2023 1.1    Monocytes Absolute 03/26/2023 0.6    Eosinophils Absolute 03/26/2023 0.3    Basophils Absolute 03/26/2023 0.1    Sodium 03/26/2023 139    Potassium 03/26/2023 4.4    Chloride 03/26/2023 106    CO2 03/26/2023 27    Glucose, Bld 03/26/2023 108 (H)    BUN 03/26/2023 22    Creatinine, Ser 03/26/2023 1.37    Total  Bilirubin 03/26/2023 0.4    Alkaline Phosphatase 03/26/2023 100    AST 03/26/2023 18    ALT 03/26/2023 20    Total Protein 03/26/2023 6.5    Albumin 03/26/2023 4.2    GFR 03/26/2023 53.88 (L)    Calcium 03/26/2023 9.2    Cholesterol 03/26/2023 190    Triglycerides 03/26/2023 72.0    HDL 03/26/2023 51.70    VLDL 03/26/2023 14.4    LDL Cholesterol 03/26/2023 124 (H)    Total CHOL/HDL Ratio 03/26/2023 4    NonHDL 03/26/2023 138.36    PSA 03/26/2023 0.93    TSH 03/26/2023 0.98    Hgb A1c MFr Bld 03/26/2023 6.1   Lab on 10/01/2022  Component Date Value   Cholesterol 10/01/2022 188    Triglycerides 10/01/2022 97.0    HDL 10/01/2022 47.40    VLDL 10/01/2022 19.4    LDL Cholesterol 10/01/2022 121 (H)    Total CHOL/HDL Ratio 10/01/2022 4    NonHDL 10/01/2022 140.14    PSA 10/01/2022 1.02   No image results found. CT CARDIAC SCORING (SELF PAY ONLY) Addendum Date: 04/22/2023 ADDENDUM REPORT: 04/22/2023 03:46 EXAM: OVER-READ INTERPRETATION  CT CHEST The following report is an over-read performed by radiologist Dr. Alcide Clever of [facility] on 04/22/2023. This over-read does not include interpretation of cardiac or coronary anatomy or pathology. The coronary calcium score interpretation by the cardiologist is attached. COMPARISON:  None. FINDINGS: Cardiovascular: Scattered atherosclerotic calcifications of the aorta are noted. No aneurysmal dilatation is seen. Mediastinum/Nodes: There are no enlarged lymph nodes within the visualized mediastinum. Lungs/Pleura: There is no pleural effusion. The visualized lungs appear clear. Upper abdomen: No significant findings in the visualized upper abdomen. Musculoskeletal/Chest wall: No chest wall mass or suspicious osseous findings within the visualized chest. IMPRESSION: Aortic Atherosclerosis (ICD10-I70.0). Electronically Signed   By: Alcide Clever M.D.   On: 04/22/2023 03:46   Result Date: 04/22/2023 CLINICAL DATA:  cardiovascular disease risk  stratification EXAM: Coronary Calcium Score TECHNIQUE: A gated, non-contrast computed tomography scan of the heart was performed using 3mm slice thickness. Axial images were analyzed on a dedicated workstation. Calcium scoring of the coronary  arteries was performed using the Agatston method. FINDINGS: Coronary Calcium Score: Left main: 53.6 Left anterior descending artery: 55.5 Left circumflex artery: 0 Right coronary artery: 0 Total: 109 Percentile: 54th Aortic atherosclerosis. Pericardium: Normal. Non-cardiac: See separate report from [facility]. IMPRESSION: 1. Coronary calcium score of 109. This was 54th percentile for age-, race-, and sex-matched controls. RECOMMENDATIONS: Coronary artery calcium (CAC) score is a strong predictor of incident coronary heart disease (CHD) and provides predictive information beyond traditional risk factors. CAC scoring is reasonable to use in the decision to withhold, postpone, or initiate statin therapy in intermediate-risk or selected borderline-risk asymptomatic adults (age 34-75 years and LDL-C >=70 to <190 mg/dL) who do not have diabetes or established atherosclerotic cardiovascular disease (ASCVD).* In intermediate-risk (10-year ASCVD risk >=7.5% to <20%) adults or selected borderline-risk (10-year ASCVD risk >=5% to <7.5%) adults in whom a CAC score is measured for the purpose of making a treatment decision the following recommendations have been made: If CAC=0, it is reasonable to withhold statin therapy and reassess in 5 to 10 years, as long as higher risk conditions are absent (diabetes mellitus, family history of premature CHD in first degree relatives (males <55 years; females <65 years), cigarette smoking, or LDL >=190 mg/dL). If CAC is 1 to 99, it is reasonable to initiate statin therapy for patients >=4 years of age. If CAC is >=100 or >=75th percentile, it is reasonable to initiate statin therapy at any age. Cardiology referral should be considered for  patients with CAC scores >=400 or >=75th percentile. *2018 AHA/ACC/AACVPR/AAPA/ABC/ACPM/ADA/AGS/APhA/ASPC/NLA/PCNA Guideline on the Management of Blood Cholesterol: A Report of the American College of Cardiology/American Heart Association Task Force on Clinical Practice Guidelines. J Am Coll Cardiol. 2019;73(24):3168-3209. Chilton Si, MD Electronically Signed: By: Chilton Si M.D. On: 04/07/2023 16:28   XR Knee 1-2 Views Right Result Date: 01/28/2023 Well-seated prosthesis without complication     Results LABS GFR: 45     Assessment & Plan Resistant hypertension Assessment Evening hypertension with readings 140-160 mmHg despite multiple medications. Morning readings well-controlled in 120s/70s.  Plan Increase amlodipine to 5mg  daily, split as 2.5mg  twice daily Adjust losartan to 50mg  twice daily (every 12 hours) Target BP: 130/80 mmHg (temporary readings to 140 acceptable) Continue home BP monitoring   Acute kidney injury superimposed on stage 3a chronic kidney disease (HCC) Assessment - Current GFR: 45 - Likely secondary to long-standing hypertension - Low risk for progression (2-3% chance of kidney failure with current management)   Plan Initiating Jardiance 10mg  daily for kidney protection Renal ultrasound ordered to evaluate structure Monitor kidney function every 2 weeks with BMP Cystatin C and UACR testing ordered Referral to renal nutritionist initiated    Dietary Recommendations Reduce sugar intake (especially with Jardiance initiation) Incorporate healthy fats: avocados, extra virgin olive oil, fatty fish Continue avoiding processed foods Maintain low salt diet Ensure adequate hydration   Proteinuria, unspecified type   Follow-up Plan: BMP every 2 weeks Return visit based on lab results and BP readings Start new medication regimen as prescribed Continue home BP monitoring with documentation        Orders Placed During this Encounter:   Orders  Placed This Encounter  Procedures   US Renal    [facility] MCR ID#: [id number]  EPIC WT: 155 NO NEEDS NO DEVICES PT AWARE 75 NO SHOW FEE KG AND PT PT AWARE ARRIVE WITH A FULL BLADDER, 1HR PRIOR    Standing Status:   Future    Expiration Date:   04/25/2024  Reason for Exam (SYMPTOM  OR DIAGNOSIS REQUIRED):   ckd    Preferred imaging location?:   [facility]   Cystatin C with Glomerular Filtration Rate, Estimated (eGFR)   Microalbumin / creatinine urine ratio    Standing Status:   Future    Expiration Date:   04/25/2024   Protein / creatinine ratio, urine   Basic Metabolic Panel (BMET)    Standing Status:   Standing    Number of Occurrences:   10    Expiration Date:   04/25/2024   Amb ref to Medical Nutrition Therapy-MNT    Referral Priority:   Routine    Referral Type:   Consultation    Referral Reason:   Specialty Services Required    Requested Specialty:   Nutrition    Number of Visits Requested:   1   Meds ordered this encounter  Medications   amLODipine (NORVASC) 5 MG tablet    Sig: Take 0.5 tablets (2.5 mg total) by mouth in the morning and at bedtime.    Dispense:  90 tablet    Refill:  3   losartan (COZAAR) 100 MG tablet    Sig: Take 0.5 tablets (50 mg total) by mouth 2 (two) times daily.    Dispense:  90 tablet    Refill:  3   empagliflozin (JARDIANCE) 10 MG TABS tablet    Sig: Take 1 tablet (10 mg total) by mouth daily before breakfast.    Dispense:  90 tablet    Refill:  3    Medical Decision Making: 2 or more stable chronic illnesses     Ordering of each unique test; Prescription drug management        **This document was synthesized by artificial intelligence (Abridge) using HIPAA-compliant recording of the clinical interaction;   We discussed the use of AI scribe software for clinical note transcription with the patient, who gave verbal consent to proceed.    Additional Info: This encounter employed state-of-the-art, real-time, collaborative  documentation. The patient actively reviewed and assisted in updating their electronic medical record on a shared screen, ensuring transparency and facilitating joint problem-solving for the problem list, overview, and plan. This approach promotes accurate, informed care. The treatment plan was discussed and reviewed in detail, including medication safety, potential side effects, and all patient questions. We confirmed understanding and comfort with the plan. Follow-up instructions were established, including contacting the office for any concerns, returning if symptoms worsen, persist, or new symptoms develop, and precautions for potential emergency department visits.

## 2023-04-27 ENCOUNTER — Encounter: Attending: Internal Medicine | Admitting: Skilled Nursing Facility1

## 2023-04-27 ENCOUNTER — Encounter: Payer: Self-pay | Admitting: Skilled Nursing Facility1

## 2023-04-27 DIAGNOSIS — Z713 Dietary counseling and surveillance: Secondary | ICD-10-CM | POA: Insufficient documentation

## 2023-04-27 DIAGNOSIS — N183 Chronic kidney disease, stage 3 unspecified: Secondary | ICD-10-CM | POA: Insufficient documentation

## 2023-04-27 DIAGNOSIS — I129 Hypertensive chronic kidney disease with stage 1 through stage 4 chronic kidney disease, or unspecified chronic kidney disease: Secondary | ICD-10-CM | POA: Insufficient documentation

## 2023-04-27 LAB — BASIC METABOLIC PANEL WITH GFR
BUN: 29 mg/dL — ABNORMAL HIGH (ref 6–23)
CO2: 26 meq/L (ref 19–32)
Calcium: 9.2 mg/dL (ref 8.4–10.5)
Chloride: 99 meq/L (ref 96–112)
Creatinine, Ser: 1.76 mg/dL — ABNORMAL HIGH (ref 0.40–1.50)
GFR: 39.87 mL/min — ABNORMAL LOW (ref >60.00)
Glucose, Bld: 108 mg/dL — ABNORMAL HIGH (ref 70–99)
Potassium: 3.8 meq/L (ref 3.5–5.1)
Sodium: 134 meq/L — ABNORMAL LOW (ref 135–145)

## 2023-04-27 NOTE — Progress Notes (Unsigned)
 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   Estimated daily fluid intake:  oz Supplements:  Sleep: pt states it is fine  Stress / self-care:  Current average weekly physical activity: Very Active   24-Hr Dietary Recall: frozen burrito 1-2 times a week  First Meal: peanut butter banana whole wheat toast Snack:  Second Meal: sandwich or skipped  Snack:  Third Meal: ground [place] or chicken with sauce or fratata  Snack: cookies or pastries made at home or bought Beverages: coffee + powder creamer, water      NUTRITION INTERVENTION  Nutrition education (E-1) on the following topics:  CKD eating plan: low animal protein, appropriate grams of protein, increase fruits and vegetables  Eating often enough fro increase nutrition in diet   Handouts Provided Include  Detailed MyPlate  Learning Style & Readiness for Change Teaching method utilized: Visual & Auditory  Demonstrated degree of understanding via: Teach Back  Barriers to learning/adherence to lifestyle change: none identieid   Goals Established by Pt Aim for 80 ounces of fluid per day   MONITORING & EVALUATION Dietary intake, weekly physical activity, and *** in ***.  Next Steps  Patient is to ***.

## 2023-04-28 ENCOUNTER — Encounter: Payer: Self-pay | Admitting: Internal Medicine

## 2023-04-28 ENCOUNTER — Other Ambulatory Visit: Payer: Self-pay

## 2023-04-28 ENCOUNTER — Ambulatory Visit
Admission: RE | Admit: 2023-04-28 | Discharge: 2023-04-28 | Disposition: A | Discharge status: Home / Self Care | Source: Ambulatory Visit | Attending: Internal Medicine | Admitting: Internal Medicine

## 2023-04-28 DIAGNOSIS — N183 Chronic kidney disease, stage 3 unspecified: Secondary | ICD-10-CM

## 2023-04-28 DIAGNOSIS — N189 Chronic kidney disease, unspecified: Secondary | ICD-10-CM | POA: Diagnosis not present

## 2023-04-28 DIAGNOSIS — N1831 Chronic kidney disease, stage 3a: Secondary | ICD-10-CM

## 2023-04-28 DIAGNOSIS — I10 Essential (primary) hypertension: Secondary | ICD-10-CM

## 2023-04-28 LAB — PROTEIN / CREATININE RATIO, URINE
Creatinine, Urine: 58 mg/dL (ref 20–320)
Protein/Creat Ratio: 155 mg/g{creat} — ABNORMAL HIGH (ref 25–148)
Protein/Creatinine Ratio: 0.155 mg/mg{creat} — ABNORMAL HIGH (ref 0.025–0.148)
Total Protein, Urine: 9 mg/dL (ref 5–25)

## 2023-04-28 LAB — CYSTATIN C WITH GLOMERULAR FILTRATION RATE, ESTIMATED (EGFR)
CYSTATIN C: 1.68 mg/L — ABNORMAL HIGH (ref 0.52–1.20)
eGFR: 38 mL/min/{1.73_m2} — ABNORMAL LOW (ref >=60)

## 2023-04-28 NOTE — Telephone Encounter (Signed)
 Spoke with pt about labs over phone understood well. Pt is getting his renal ultrasound now.placed orders pt stated he would be in on Monday to redo labs.

## 2023-04-29 ENCOUNTER — Encounter: Payer: Self-pay | Admitting: Internal Medicine

## 2023-04-29 NOTE — Progress Notes (Signed)
 Increased echotexture of bilateral kidneys, consistent with medical renal disease. This is what we expected... some mild kidney disease with no tumor or kidney stone.  So this is good news.

## 2023-04-29 NOTE — Progress Notes (Signed)
 Needs urgent follow up on the patient message for creatinine- cystatin C and protein confirm that kidney injury is still happening... that he isn't tolerating the losartan so he needs to hold it.

## 2023-04-30 MED ORDER — DOXAZOSIN MESYLATE 1 MG PO TABS: 1.0000 mg | ORAL_TABLET | Freq: Every day | ORAL | 3 refills | Status: DC

## 2023-04-30 NOTE — Telephone Encounter (Signed)
 MyChart secure digital messaging clinical encounter  Chief Complaint:  Patient reports elevated blood pressure readings after discontinuation of losartan, with readings now mid 150s-160s/mid 80s-90s compared to previous controlled readings of mid 130s/mid 70s.  Relevant History: - Patient with history of hypertension on multi-drug regimen - Recently developed acute kidney injury (AKI) - Losartan and HCTZ were held due to worsening AKI - Amlodipine was recently increased to 5mg  BID as compensation - Patient appropriately monitoring and reporting home blood pressure readings - Patient demonstrating good understanding of medication management  Assessment: 1. Hypertension, inadequately controlled (I10) - Blood pressure elevated after necessary discontinuation of losartan and HCTZ due to AKI. Current readings mid 150s-160s/mid 80s-90s indicating need for medication adjustment while protecting renal function. 2. Acute kidney injury (N17.9) - Ongoing management with temporary discontinuation of potentially nephrotoxic medications.  Plan: 1. Add doxazosin (Cardura) 1mg  daily at bedtime    - Alpha-blocker chosen to provide BP control without compromising renal function    - Starting at low dose to minimize orthostatic hypotension risk    - Electronic prescription sent to patient's pharmacy  2. Continue holding losartan and HCTZ until renal function improves    - Continue amlodipine 5mg  BID as recently increased  3. Dietary recommendations    - Sodium restriction to 2000mg  daily    - Limit alcohol consumption    - Continue appropriate physical activity  4. Follow-up    - Renal function labs in 3-5 days    - Continue home BP monitoring twice daily    - MyChart follow-up after lab results available    - Medication regimen to be reassessed based on kidney function improvement  5. Patient education provided    - BP monitoring technique reviewed    - Medication instructions including  possible side effects    - Emergency warning signs reviewed    - Importance of sodium restriction reinforced  Current Medical Guidelines Reference: Management follows 2023 ESC/ESH Guidelines for management of hypertension and KDIGO guidelines for AKI management, which recommend temporary discontinuation of RAAS inhibitors and diuretics during AKI episodes while maintaining BP control with alternative agents (https://kdigo.org/guidelines/acute-kidney-injury/).  Medical Decision Making: Moderate complexity due to: - Management of hypertension during acute kidney injury requiring careful medication selection - Balancing risks of uncontrolled hypertension against potential worsening of kidney function - Need for close monitoring and frequent reassessment - Coordination of care between BP management and renal protection - Selection of alpha-blocker as appropriate agent given contraindications to first-line medications  Please see the MyChart message reply(ies) for my assessment and plan.  This patient gave consent for this Medical Advice Message and is aware that it may result in a bill to [employer], as well as the possibility of receiving a bill for a co-payment or deductible. They are an established patient, but are not seeking medical advice exclusively about a problem treated during an in person or video visit in the last seven days. I did not recommend an in person or video visit within seven days of my reply.  I spent a total of 5 minutes cumulative time within 7 days through [employer]. Lula Olszewski, MD  Problem List Update: - Hypertension (I10): Management complicated by AKI; temporarily holding losartan and HCTZ; added doxazosin - Acute kidney injury (N17.9): Monitoring; holding nephrotoxic medications

## 2023-05-03 ENCOUNTER — Other Ambulatory Visit (INDEPENDENT_AMBULATORY_CARE_PROVIDER_SITE_OTHER)

## 2023-05-03 DIAGNOSIS — N183 Chronic kidney disease, stage 3 unspecified: Secondary | ICD-10-CM

## 2023-05-03 DIAGNOSIS — N179 Acute kidney failure, unspecified: Secondary | ICD-10-CM | POA: Diagnosis not present

## 2023-05-03 DIAGNOSIS — N1831 Chronic kidney disease, stage 3a: Secondary | ICD-10-CM | POA: Diagnosis not present

## 2023-05-03 DIAGNOSIS — H40023 Open angle with borderline findings, high risk, bilateral: Secondary | ICD-10-CM | POA: Diagnosis not present

## 2023-05-03 LAB — BASIC METABOLIC PANEL WITH GFR
BUN: 21 mg/dL (ref 6–23)
CO2: 24 meq/L (ref 19–32)
Calcium: 9.3 mg/dL (ref 8.4–10.5)
Chloride: 104 meq/L (ref 96–112)
Creatinine, Ser: 1.41 mg/dL (ref 0.40–1.50)
GFR: 52.02 mL/min — ABNORMAL LOW (ref >60.00)
Glucose, Bld: 101 mg/dL — ABNORMAL HIGH (ref 70–99)
Potassium: 3.7 meq/L (ref 3.5–5.1)
Sodium: 137 meq/L (ref 135–145)

## 2023-05-03 LAB — MICROALBUMIN / CREATININE URINE RATIO
Creatinine,U: 65.1 mg/dL
Microalb Creat Ratio: UNDETERMINED mg/g (ref 0.0–30.0)
Microalb, Ur: <0.7 mg/dL

## 2023-05-04 ENCOUNTER — Emergency Department (HOSPITAL_BASED_OUTPATIENT_CLINIC_OR_DEPARTMENT_OTHER)
Admission: EM | Admit: 2023-05-04 | Discharge: 2023-05-05 | Disposition: A | Discharge status: Home / Self Care | Attending: Emergency Medicine | Admitting: Emergency Medicine

## 2023-05-04 ENCOUNTER — Other Ambulatory Visit: Payer: Self-pay

## 2023-05-04 ENCOUNTER — Emergency Department (HOSPITAL_BASED_OUTPATIENT_CLINIC_OR_DEPARTMENT_OTHER)

## 2023-05-04 DIAGNOSIS — M545 Low back pain, unspecified: Secondary | ICD-10-CM | POA: Insufficient documentation

## 2023-05-04 DIAGNOSIS — I129 Hypertensive chronic kidney disease with stage 1 through stage 4 chronic kidney disease, or unspecified chronic kidney disease: Secondary | ICD-10-CM | POA: Diagnosis not present

## 2023-05-04 DIAGNOSIS — N200 Calculus of kidney: Secondary | ICD-10-CM | POA: Diagnosis not present

## 2023-05-04 DIAGNOSIS — K8020 Calculus of gallbladder without cholecystitis without obstruction: Secondary | ICD-10-CM | POA: Diagnosis not present

## 2023-05-04 DIAGNOSIS — Z79899 Other long term (current) drug therapy: Secondary | ICD-10-CM | POA: Diagnosis not present

## 2023-05-04 DIAGNOSIS — N183 Chronic kidney disease, stage 3 unspecified: Secondary | ICD-10-CM | POA: Insufficient documentation

## 2023-05-04 DIAGNOSIS — R109 Unspecified abdominal pain: Secondary | ICD-10-CM | POA: Diagnosis not present

## 2023-05-04 DIAGNOSIS — N281 Cyst of kidney, acquired: Secondary | ICD-10-CM | POA: Diagnosis not present

## 2023-05-04 DIAGNOSIS — R7989 Other specified abnormal findings of blood chemistry: Secondary | ICD-10-CM | POA: Insufficient documentation

## 2023-05-04 DIAGNOSIS — R10A0 Flank pain, unspecified side: Secondary | ICD-10-CM

## 2023-05-04 LAB — BASIC METABOLIC PANEL WITH GFR
Anion gap: 10 (ref 5–15)
BUN: 22 mg/dL (ref 8–23)
CO2: 22 mmol/L (ref 22–32)
Calcium: 9.2 mg/dL (ref 8.9–10.3)
Chloride: 101 mmol/L (ref 98–111)
Creatinine, Ser: 1.97 mg/dL — ABNORMAL HIGH (ref 0.61–1.24)
GFR, Estimated: 37 mL/min — ABNORMAL LOW (ref >60)
Glucose, Bld: 100 mg/dL — ABNORMAL HIGH (ref 70–99)
Potassium: 3.9 mmol/L (ref 3.5–5.1)
Sodium: 133 mmol/L — ABNORMAL LOW (ref 135–145)

## 2023-05-04 LAB — CBC
HCT: 43 % (ref 39.0–52.0)
Hemoglobin: 14.7 g/dL (ref 13.0–17.0)
MCH: 29.5 pg (ref 26.0–34.0)
MCHC: 34.2 g/dL (ref 30.0–36.0)
MCV: 86.2 fL (ref 80.0–100.0)
Platelets: 173 10*3/uL (ref 150–400)
RBC: 4.99 MIL/uL (ref 4.22–5.81)
RDW: 13 % (ref 11.5–15.5)
WBC: 7.1 10*3/uL (ref 4.0–10.5)
nRBC: 0 % (ref 0.0–0.2)

## 2023-05-04 LAB — URINALYSIS, ROUTINE W REFLEX MICROSCOPIC
Bacteria, UA: NONE SEEN
Bilirubin Urine: NEGATIVE
Glucose, UA: 500 mg/dL — AB
Hgb urine dipstick: NEGATIVE
Ketones, ur: NEGATIVE mg/dL
Leukocytes,Ua: NEGATIVE
Nitrite: NEGATIVE
Protein, ur: NEGATIVE mg/dL
Specific Gravity, Urine: 1.007 (ref 1.005–1.030)
pH: 5.5 (ref 5.0–8.0)

## 2023-05-04 LAB — CYSTATIN C WITH GLOMERULAR FILTRATION RATE, ESTIMATED (EGFR)
CYSTATIN C: 1.29 mg/L — ABNORMAL HIGH (ref 0.52–1.20)
eGFR: 54 mL/min/{1.73_m2} — ABNORMAL LOW (ref >=60)

## 2023-05-04 MED ORDER — LIDOCAINE 5 % EX PTCH: 1.0000 | MEDICATED_PATCH | CUTANEOUS | 0 refills | Status: DC

## 2023-05-04 MED ORDER — LACTATED RINGERS IV BOLUS
1000.0000 mL | Freq: Once | INTRAVENOUS | Status: AC
  Administered 2023-05-04: 1000 mL via INTRAVENOUS

## 2023-05-04 NOTE — ED Provider Notes (Signed)
 [place] EMERGENCY DEPARTMENT AT [facility] Provider Note   CSN: [id number] Arrival date & time: 05/04/23  2034     History  Chief Complaint  Patient presents with   Flank Pain    Eric Crane is a 68 y.o. male.  He has PMH of stage III CKD, hypertension and prediabetes.  He presents to the ER today for evaluation of sharp left low back pain.  He has been going on for a couple of weeks.  States he recently had ultrasound and CT as outpatient that was normal.  The pain got more intense this morning and his urine was "foamy".  He called his PCP who told him to come in to be evaluated to make sure he did not have a kidney stone.  He denies hematuria, no fevers or chills.  Pain is somewhat reproduced when he bends over and then straightens back up.  He states he plays hockey and was tying his skates last night and when he stood back up he felt the pain but states it is worse today than it has been he was able to work all day today but states he did not drink anything and thinks he got a bit dehydrated as well.  He denies any back trauma, denies saddle anesthesia or paresthesia, no bowel or bladder incontinence.  No numbness tingling or weakness in his extremities.   Flank Pain       Home Medications Prior to Admission medications   Medication Sig Start Date End Date Taking? Authorizing Provider  albuterol (VENTOLIN HFA) 108 (90 Base) MCG/ACT inhaler Inhale 2 puffs into the lungs every 6 (six) hours as needed for wheezing or shortness of breath. 03/10/21   Janeece Agee, NP  amLODipine (NORVASC) 5 MG tablet Take 0.5 tablets (2.5 mg total) by mouth in the morning and at bedtime. 04/26/23   Lula Olszewski, MD  amphetamine-dextroamphetamine (ADDERALL) 20 MG tablet Take 20 mg by mouth 3 (three) times daily. 10/20/21   [provider]  aspirin EC 81 MG tablet Take 1 tablet (81 mg total) by mouth daily. Swallow whole. 04/09/23   Lula Olszewski, MD  doxazosin (CARDURA)  1 MG tablet Take 1 tablet (1 mg total) by mouth daily. 04/30/23   Lula Olszewski, MD  empagliflozin (JARDIANCE) 10 MG TABS tablet Take 1 tablet (10 mg total) by mouth daily before breakfast. 04/26/23   Lula Olszewski, MD  hydrochlorothiazide (HYDRODIURIL) 25 MG tablet Take 1 tablet (25 mg total) by mouth daily. Take in am, makes salty urine.  Only need if blood pressure average not below 140/90 04/09/23   Lula Olszewski, MD  lamoTRIgine (LAMICTAL) 200 MG tablet Take 200 mg by mouth daily.    [provider]  losartan (COZAAR) 100 MG tablet Take 0.5 tablets (50 mg total) by mouth 2 (two) times daily. 04/26/23   Lula Olszewski, MD  lovastatin (MEVACOR) 40 MG tablet Take 1 tablet (40 mg total) by mouth at bedtime. 04/09/23   Lula Olszewski, MD  lurasidone (LATUDA) 80 MG TABS tablet Take 80 mg by mouth daily. 02/27/23   [provider]  QUEtiapine Fumarate (SEROQUEL XR) 150 MG 24 hr tablet Take 300 mg by mouth at bedtime. 04/08/22   [provider]      Allergies    Patient has no known allergies.    Review of Systems   Review of Systems  Genitourinary:  Positive for flank pain.    Physical Exam  Updated Vital Signs BP (!) 183/84 (BP Location: Right Arm)   Pulse (!) 59   Temp 97.8 F (36.6 C)   Resp 16   SpO2 100%  Physical Exam  ED Results / Procedures / Treatments   Labs (all labs ordered are listed, but only abnormal results are displayed) Labs Reviewed  URINALYSIS, ROUTINE W REFLEX MICROSCOPIC - Abnormal; Notable for the following components:      Result Value   Color, Urine COLORLESS (*)    Glucose, UA 500 (*)    All other components within normal limits  BASIC METABOLIC PANEL WITH GFR - Abnormal; Notable for the following components:   Sodium 133 (*)    Glucose, Bld 100 (*)    Creatinine, Ser 1.97 (*)    GFR, Estimated 37 (*)    All other components within normal limits  CBC    EKG None  Radiology No results  found.  Procedures Procedures    Medications Ordered in ED Medications  lactated ringers bolus 1,000 mL (has no administration in time range)    ED Course/ Medical Decision Making/ A&P                                 Medical Decision Making Differential diagnosis includes but not limited to ureterolithiasis, pyelonephritis, muscle strain, HNP, contusion, other  ED course: Patient here for sharp left low back pain that has been intermittent for the past couple of weeks, advised to come be evaluated by his PCP today.  He has been seen recently and had ultrasound that showed medical renal disease.  CT scan he had recently was actually a calcium score, not renal stone study.  Labs show increase in his creatinine from baseline at 1.97, yesterday this is 1.41.  Patient states he was playing hockey last night and then went most the day today without drinking so this potentially could be due to prerenal cause.  CT abdomen pelvis pending to rule out stone.  If no abnormalities on CT will plan to have patient follow-up with PCP, recheck labs closely, use Tylenol as needed for discomfort and lidocaine patches.  Amount and/or Complexity of Data Reviewed Labs: ordered. Radiology: ordered.   Sigjned out to Dr. Pilar Plate pending CT read         Final Clinical Impression(s) / ED Diagnoses Final diagnoses:  None    Rx / DC Orders ED Discharge Orders     None         Ma Rings, PA-C 05/05/23 0016    Sabas Sous, MD 05/05/23 [phone]

## 2023-05-04 NOTE — ED Triage Notes (Signed)
 Urine last night changed to foamy and intense kidney/flank pain. Has CKD-years of NSAID overuse and concerned for stones. Sent by PCP.

## 2023-05-04 NOTE — ED Provider Notes (Incomplete)
 [name] EMERGENCY DEPARTMENT AT [facility] Provider Note   CSN: [id number] Arrival date & time: 05/04/23  2034     History {Add pertinent medical, surgical, social history, OB history to HPI:1} Chief Complaint  Patient presents with  . Flank Pain    Eric Crane is a 68 y.o. male.  He has PMH of stage III CKD, hypertension and prediabetes.  He presents to the ER today for evaluation of sharp left low back pain.  He has been going on for a couple of weeks.  States he recently had ultrasound and CT as outpatient that was normal.  The pain got more intense this morning and his urine was "foamy".  He called his PCP who told him to come in to be evaluated to make sure he did not have a kidney stone.  He denies hematuria, no fevers or chills.  Pain is somewhat reproduced when he bends over and then straightens back up.  He states he plays hockey and was tying his skates last night and when he stood back up he felt the pain but states it is worse today than it has been he was able to work all day today but states he did not drink anything and thinks he got a bit dehydrated as well.  He denies any back trauma, denies saddle anesthesia or paresthesia, no bowel or bladder incontinence.  No numbness tingling or weakness in his extremities.   Flank Pain       Home Medications Prior to Admission medications   Medication Sig Start Date End Date Taking? Authorizing Provider  albuterol (VENTOLIN HFA) 108 (90 Base) MCG/ACT inhaler Inhale 2 puffs into the lungs every 6 (six) hours as needed for wheezing or shortness of breath. 03/10/21   Janeece Agee, NP  amLODipine (NORVASC) 5 MG tablet Take 0.5 tablets (2.5 mg total) by mouth in the morning and at bedtime. 04/26/23   Lula Olszewski, MD  amphetamine-dextroamphetamine (ADDERALL) 20 MG tablet Take 20 mg by mouth 3 (three) times daily. 10/20/21   [provider]  aspirin EC 81 MG tablet Take 1 tablet (81 mg total) by mouth  daily. Swallow whole. 04/09/23   Lula Olszewski, MD  doxazosin (CARDURA) 1 MG tablet Take 1 tablet (1 mg total) by mouth daily. 04/30/23   Lula Olszewski, MD  empagliflozin (JARDIANCE) 10 MG TABS tablet Take 1 tablet (10 mg total) by mouth daily before breakfast. 04/26/23   Lula Olszewski, MD  hydrochlorothiazide (HYDRODIURIL) 25 MG tablet Take 1 tablet (25 mg total) by mouth daily. Take in am, makes salty urine.  Only need if blood pressure average not below 140/90 04/09/23   Lula Olszewski, MD  lamoTRIgine (LAMICTAL) 200 MG tablet Take 200 mg by mouth daily.    [provider]  losartan (COZAAR) 100 MG tablet Take 0.5 tablets (50 mg total) by mouth 2 (two) times daily. 04/26/23   Lula Olszewski, MD  lovastatin (MEVACOR) 40 MG tablet Take 1 tablet (40 mg total) by mouth at bedtime. 04/09/23   Lula Olszewski, MD  lurasidone (LATUDA) 80 MG TABS tablet Take 80 mg by mouth daily. 02/27/23   [provider]  QUEtiapine Fumarate (SEROQUEL XR) 150 MG 24 hr tablet Take 300 mg by mouth at bedtime. 04/08/22   [provider]      Allergies    Patient has no known allergies.    Review of Systems   Review of Systems  Genitourinary:  Positive for flank pain.    Physical Exam Updated Vital Signs BP (!) 183/84 (BP Location: Right Arm)   Pulse (!) 59   Temp 97.8 F (36.6 C)   Resp 16   SpO2 100%  Physical Exam  ED Results / Procedures / Treatments   Labs (all labs ordered are listed, but only abnormal results are displayed) Labs Reviewed  URINALYSIS, ROUTINE W REFLEX MICROSCOPIC - Abnormal; Notable for the following components:      Result Value   Color, Urine COLORLESS (*)    Glucose, UA 500 (*)    All other components within normal limits  BASIC METABOLIC PANEL WITH GFR - Abnormal; Notable for the following components:   Sodium 133 (*)    Glucose, Bld 100 (*)    Creatinine, Ser 1.97 (*)    GFR, Estimated 37 (*)    All other components within normal limits   CBC    EKG None  Radiology No results found.  Procedures Procedures  {Document cardiac monitor, telemetry assessment procedure when appropriate:1}  Medications Ordered in ED Medications  lactated ringers bolus 1,000 mL (has no administration in time range)    ED Course/ Medical Decision Making/ A&P   {   Click here for ABCD2, HEART and other calculatorsREFRESH Note before signing :1}                              Medical Decision Making Differential diagnosis includes but not limited to ureterolithiasis, pyelonephritis, muscle strain, HNP, contusion, other  ED course: Patient here for sharp left low back pain that has been intermittent for the past couple of weeks, advised to come be evaluated by his PCP today.  He has been seen recently and had ultrasound that showed medical renal disease.  CT scan he had recently was actually a calcium score, not renal stone study.  Labs show increase in his creatinine from baseline at 1.97, yesterday this is 1.41.  Patient states he was playing hockey last night and then went most the day today without drinking so this potentially could be due to prerenal cause.  CT abdomen pelvis pending to rule out stone.  If no abnormalities on CT will plan to have patient follow-up with PCP, recheck labs closely, use Tylenol as needed for discomfort and lidocaine patches.  Amount and/or Complexity of Data Reviewed Labs: ordered. Radiology: ordered.   ***  {Document critical care time when appropriate:1} {Document review of labs and clinical decision tools ie heart score, Chads2Vasc2 etc:1}  {Document your independent review of radiology images, and any outside records:1} {Document your discussion with family members, caretakers, and with consultants:1} {Document social determinants of health affecting pt's care:1} {Document your decision making why or why not admission, treatments were needed:1} Final Clinical Impression(s) / ED Diagnoses Final  diagnoses:  None    Rx / DC Orders ED Discharge Orders     None

## 2023-05-05 MED ORDER — METHOCARBAMOL 500 MG PO TABS: 500.0000 mg | ORAL_TABLET | Freq: Three times a day (TID) | ORAL | 0 refills | Status: DC | PRN

## 2023-05-05 NOTE — ED Provider Notes (Signed)
  Provider Note MRN:  [id number]  Arrival date & time: 05/05/23    ED Course and Medical Decision Making  Assumed care of patient at sign-out or upon transfer.  Flank pain awaiting CT to evaluate for kidney stone, anticipating discharge.  Procedures  Final Clinical Impressions(s) / ED Diagnoses     ICD-10-CM   1. Flank pain  R10.9       ED Discharge Orders          Ordered    methocarbamol (ROBAXIN) 500 MG tablet  Every 8 hours PRN        05/05/23 0130    lidocaine (LIDODERM) 5 %  Every 24 hours        05/04/23 2351              Discharge Instructions      You were evaluated in the Emergency Department and after careful evaluation, we did not find any emergent condition requiring admission or further testing in the hospital.  Your exam/testing today is overall reassuring.  No signs of obstructing kidney stones, pain likely related to muscle strain or spasm.  Can use the numbing patches, Tylenol at home for discomfort.  Can use the Robaxin muscle relaxer for more significant pain but use caution as this medication can cause drowsiness.  Please return to the Emergency Department if you experience any worsening of your condition.   Thank you for allowing [place] to be a part of your care.      Elmer Sow. Pilar Plate, MD [facility] Emergency [facility] [email or web address]    Sabas Sous, MD 05/05/23 0130

## 2023-05-05 NOTE — ED Notes (Signed)
 Pt came here to be evaluated

## 2023-05-05 NOTE — Discharge Instructions (Signed)
 You were evaluated in the Emergency Department and after careful evaluation, we did not find any emergent condition requiring admission or further testing in the hospital.  Your exam/testing today is overall reassuring.  No signs of obstructing kidney stones, pain likely related to muscle strain or spasm.  Can use the numbing patches, Tylenol at home for discomfort.  Can use the Robaxin muscle relaxer for more significant pain but use caution as this medication can cause drowsiness.  Please return to the Emergency Department if you experience any worsening of your condition.   Thank you for allowing [place] to be a part of your care.

## 2023-05-06 ENCOUNTER — Encounter: Payer: Self-pay | Admitting: Internal Medicine

## 2023-05-06 NOTE — Progress Notes (Signed)
 Please see interpretation written in the Patient Results Comments. Eric Olszewski, MD 05/06/2023 7:13 AM

## 2023-05-07 NOTE — Telephone Encounter (Signed)
 Pt has appt on Monday 05/10/2023

## 2023-05-10 ENCOUNTER — Other Ambulatory Visit

## 2023-05-10 ENCOUNTER — Ambulatory Visit (INDEPENDENT_AMBULATORY_CARE_PROVIDER_SITE_OTHER): Admitting: Internal Medicine

## 2023-05-10 ENCOUNTER — Encounter: Payer: Self-pay | Admitting: Internal Medicine

## 2023-05-10 VITALS — BP 140/82 | HR 68 | Temp 97.5°F | Ht 67.0 in | Wt 168.0 lb

## 2023-05-10 DIAGNOSIS — E785 Hyperlipidemia, unspecified: Secondary | ICD-10-CM

## 2023-05-10 DIAGNOSIS — Q613 Polycystic kidney, unspecified: Secondary | ICD-10-CM

## 2023-05-10 DIAGNOSIS — I2510 Atherosclerotic heart disease of native coronary artery without angina pectoris: Secondary | ICD-10-CM

## 2023-05-10 DIAGNOSIS — I10 Essential (primary) hypertension: Secondary | ICD-10-CM

## 2023-05-10 DIAGNOSIS — S37009D Unspecified injury of unspecified kidney, subsequent encounter: Secondary | ICD-10-CM

## 2023-05-10 DIAGNOSIS — K8020 Calculus of gallbladder without cholecystitis without obstruction: Secondary | ICD-10-CM | POA: Insufficient documentation

## 2023-05-10 DIAGNOSIS — N183 Chronic kidney disease, stage 3 unspecified: Secondary | ICD-10-CM

## 2023-05-10 DIAGNOSIS — N200 Calculus of kidney: Secondary | ICD-10-CM | POA: Insufficient documentation

## 2023-05-10 DIAGNOSIS — R7303 Prediabetes: Secondary | ICD-10-CM

## 2023-05-10 DIAGNOSIS — I1A0 Resistant hypertension: Secondary | ICD-10-CM

## 2023-05-10 DIAGNOSIS — S37009A Unspecified injury of unspecified kidney, initial encounter: Secondary | ICD-10-CM | POA: Insufficient documentation

## 2023-05-10 LAB — MICROALBUMIN / CREATININE URINE RATIO
Creatinine,U: 13.3 mg/dL
Microalb Creat Ratio: UNDETERMINED mg/g (ref 0.0–30.0)
Microalb, Ur: <0.7 mg/dL

## 2023-05-10 MED ORDER — AMLODIPINE BESYLATE 5 MG PO TABS: 5.0000 mg | ORAL_TABLET | Freq: Two times a day (BID) | ORAL | 3 refills | Status: DC

## 2023-05-10 MED ORDER — LOSARTAN POTASSIUM 25 MG PO TABS: 25.0000 mg | ORAL_TABLET | Freq: Two times a day (BID) | ORAL | 3 refills | Status: DC

## 2023-05-10 MED ORDER — LOSARTAN POTASSIUM 25 MG PO TABS: 25.0000 mg | ORAL_TABLET | Freq: Every day | ORAL | 3 refills | Status: DC

## 2023-05-10 MED ORDER — DOXAZOSIN MESYLATE 1 MG PO TABS: 1.0000 mg | ORAL_TABLET | Freq: Two times a day (BID) | ORAL | 3 refills | Status: DC

## 2023-05-10 NOTE — Patient Instructions (Addendum)
 PATIENT VISIT SUMMARY   Thank you for your visit today. We've completed a thorough evaluation of your kidney function, blood pressure, and recent test results. This summary outlines our findings and the plan we discussed.  CURRENT HEALTH STATUS   Condition Current Status  Kidney Function Your kidney function has improved since your last visit. Your GFR (kidney filtration rate) has increased from 39 to 52, which is positive progress. The medication change we made (stopping losartan) has helped your kidneys recover.   Blood Pressure Your blood pressure remains higher than our target range, with home readings in the 150s-160s/80s-90s. Today's office reading was 140/82. We need to adjust your medications to better control your blood pressure while protecting your kidney function.   Kidney Stones Your recent CT scan showed small kidney stones in both kidneys. These are not currently causing blockage or symptoms, but we should work to prevent them from growing larger.   Gallstones The CT scan also found gallstones. These are not causing any symptoms and don't require treatment at this time, but we'll monitor for any changes.    YOUR TREATMENT PLAN       Medication Changes: Restart losartan 25 mg - Take 1 tablet twice daily (morning and evening) Continue amlodipine 5 mg - Take 1 tablet twice daily (morning and evening) Continue doxazosin 1 mg - Take 1 tablet twice daily Continue Jardiance 10 mg - Take 1 tablet daily before breakfast Discontinue hydrochlorothiazide (water pill) Kidney Protection Strategy: Drink at least 8-10 glasses (2-2.5 liters) of water daily Maintain consistent hydration throughout the day, not just during exercise Reduce salt intake to less than 2 grams per day Avoid non-steroidal anti-inflammatory drugs (NSAIDs) like ibuprofen and naproxen Monitor your blood pressure twice daily and keep a record Follow-up Plan: Blood tests in 1 week (05/17/2023) to check kidney  function Office visit in 1 week to evaluate your response to medication changes Referral to a kidney specialist (nephrologist) still pending, but now we know a lot more about the disease from the CT scan.   SEEK IMMEDIATE MEDICAL ATTENTION IF YOU EXPERIENCE: Severe flank pain (pain in your side or back) Significant decrease in urine output Blood pressure above 180/110 with headache or vision changes Severe abdominal pain, especially in the upper right side Fever with any of these symptoms     LIFESTYLE RECOMMENDATIONS   Area Recommendations  Hydration It's important to maintain consistent hydration throughout the day, not just during hockey practice. Aim for 8-10 glasses of water spread throughout the day, with increased intake during and after exercise. Remember: By the time you feel thirsty, you're already mildly dehydrated.  Diet Reduce: Salt, processed foods, and excessive animal protein Increase: Fresh fruits and vegetables, whole grains, and moderate dairy These changes will help your blood pressure and reduce kidney stone formation.  Exercise Continue with regular physical activity like hockey, but ensure proper hydration before, during, and after exercise. Consider adding light to moderate exercise on non-hockey days to help maintain consistent blood pressure.  Blood Pressure Monitoring Monitor your blood pressure twice daily (morning and evening) Record the readings in a log or app Contact us if your systolic (top number) exceeds 295 or drops below 100   QUESTIONS OR CONCERNS? ?? Office Phone: Please call if you have questions about your treatment plan ?? MyChart: Send non-urgent messages through the patient portal ?? Emergency: Call 911 or go to the nearest emergency room for severe symptoms ?? Next Appointment: One week from today to check  your kidney function and blood pressure

## 2023-05-10 NOTE — Assessment & Plan Note (Signed)
 Nephrolithiasis, Bilateral (N20.0)  Assessment: CT 05/05/2023 confirmed "punctate nonobstructing bilateral kidney stones." No current symptoms of renal colic. History of dehydration, particularly when not actively engaged in physical activity. No hydronephrosis present. Patient plays hockey and reports adequate hydration during activity but less consistent hydration during daily activities.  Plan: Increase fluid intake to >2.5L daily, maintain consistent hydration throughout the day Dietary modifications:  Low sodium diet (<2g daily) Moderate protein intake Adequate calcium from dietary sources (avoids supplements) Strain urine if experiencing symptoms of stone passage Preventive education provided about stone risk factors see AVS. Follow-up imaging in 6 months to assess stone burden

## 2023-05-10 NOTE — Progress Notes (Signed)
 ==============================  [facility]: [phone]   -- Medical Office Visit --  Patient: Eric Crane      Age: 68 y.o.       Sex:  male  Date:   05/10/2023 Today's Healthcare Provider: Lula Olszewski, MD  ==============================   Chief Complaint: acute kidney injury superimposed on stage 3a chronic kidney  History of Present Illness 68 year old male with hypertension and chronic kidney disease who presents for blood pressure management and kidney function evaluation.  His blood pressure has been elevated, with readings in the 150s-160s over 80s-90s after discontinuing losartan. He monitors his blood pressure two to three times daily and notes higher readings in the evenings. He is currently on amlodipine 10 mg daily, doxazosin, and Jardiance, with no reported side effects.  He describes his urine as occasionally foamy, which may indicate protein presence. He has a history of kidney function decline, with a GFR dropping from 58 to 39, prompting the cessation of losartan. His GFR has since improved to approximately 53-55. His cystatin C levels were elevated at 1.68 but have since decreased to 1.29.  He has a history of gallstones and kidney stones, identified incidentally on a CT scan. He reports no current flank pain but acknowledges past episodes that led to an emergency room visit. He has a history of dehydration, particularly when not actively hydrating during non-active periods. He plays hockey and hydrates well during activity but acknowledges less consistent hydration at home.  Lab Results  Component Value Date/Time   GFR 52.02 (L) 05/03/2023 10:50 AM   GFR 39.87 (L) 04/26/2023 04:02 PM   GFR 46.44 (L) 04/23/2023 11:17 AM   GFR 58.44 (L) 04/09/2023 09:38 AM   GFR 53.88 (L) 03/26/2023 09:19 AM    Lab Results  Component Value Date   MICROALBUR <0.7 05/10/2023   MICROALBUR <0.7 05/03/2023   MICROALBUR <0.7 04/09/2023   Microalbumin rations not quite as favorable as this on more precise testing.   Latest Reference Range & Units 04/09/23 09:38 04/09/23 11:51 04/19/23 14:57 04/23/23 11:17 04/26/23 15:32 04/26/23 16:02 05/03/23 10:50 05/04/23 20:52 05/10/23 13:29  BASIC METABOLIC PANEL WITH GFR  Rpt !  Rpt ! Rpt !  Rpt ! Rpt ! Rpt !   Sodium 135 - 145 mmol/L 138  132 (L) 139  134 (L) 137 133 (L)   Potassium 3.5 - 5.1 mmol/L 4.4  3.5 3.5  3.8 3.7 3.9   Chloride 98 - 111 mmol/L 104  99 101  99 104 101   CO2 22 - 32 mmol/L 26  21 26  26 24 22    Glucose 70 - 99 mg/dL 75  132 (H) 440 (H)  102 (H) 101 (H) 100 (H)   BUN 8 - 23 mg/dL 24 (H)  31 (H) 28 (H)  29 (H) 21 22   Creatinine 0.61 - 1.24 mg/dL 7.25  3.66 (H) 4.40 (H)  1.76 (H) 1.41 1.97 (H)   Calcium 8.9 - 10.3 mg/dL 9.5  9.5 9.4  9.2 9.3 9.2   Anion gap 5 - 15         10   BUN/Creatinine Ratio 6 - 22 (calc)   19        eGFR >=60 mL/min/1.71m2 50 (L)  46 (L) 45 (L) 44 (L) 38 (L)  54 (L)    GFR >60.00 mL/min 58.44 (L)   46.44 (L)  39.87 (L) 52.02 (L)    GFR, Estimated >60 mL/min  37 (L)   MICROALB/CREAT RATIO 0.0 - 30.0 mg/g Unable to calculate      Unable to calculate  Unable to calculate  CYSTATIN C 0.52 - 1.20 mg/L 1.37 (H)  1.48 (H) 1.52 (H) 1.68 (H)  1.29 (H)    WBC 4.0 - 10.5 K/uL        7.1   RBC 4.22 - 5.81 MIL/uL        4.99   Hemoglobin 13.0 - 17.0 g/dL        09.8   HCT 11.9 - 52.0 %        43.0   MCV 80.0 - 100.0 fL        86.2   MCH 26.0 - 34.0 pg        29.5   MCHC 30.0 - 36.0 g/dL        14.7   RDW 82.9 - 15.5 %        13.0   Platelets 150 - 400 K/uL        173   nRBC 0.0 - 0.2 %        0.0   NOTE:   Pend         URINALYSIS, ROUTINE W REFLEX MICROSCOPIC         Rpt !   Appearance CLEAR   CLEAR      CLEAR   Bilirubin Urine NEGATIVE   NEGATIVE      NEGATIVE   Color, Urine YELLOW   YELLOW      COLORLESS !   Glucose, UA NEGATIVE mg/dL  NEGATIVE      562 !   Hgb urine dipstick NEGATIVE   NEGATIVE      NEGATIVE   Ketones, ur NEGATIVE  mg/dL  NEGATIVE      NEGATIVE   Leukocyte Esterase NEGATIVE   NEGATIVE         Leukocytes,Ua NEGATIVE         NEGATIVE   Nitrite NEGATIVE         NEGATIVE   pH 5.0 - 8.0   6.0      5.5   Protein NEGATIVE mg/dL  NEGATIVE      NEGATIVE   Specific Gravity, Urine 1.005 - 1.030   1.017      1.007   Bacteria, UA NONE SEEN   NONE SEEN      NONE SEEN   Hyaline Cast NONE SEEN /LPF  NONE SEEN         RBC / HPF 0 - 5 RBC/hpf  NONE SEEN      0-5   REFLEXIVE URINE CULTURE   Pend         Squamous Epithelial / HPF 0 - 5 /HPF  NONE SEEN      0-5   WBC, UA 0 - 5 WBC/hpf  NONE SEEN      0-5   Creatinine,U mg/dL 13.0      86.5  78.4  Total Protein, Urine 5 - 25 mg/dL     9      Microalb, Ur mg/dL <6.9      <6.2  <9.5  Creatinine, Urine 20 - 320 mg/dL     58      Protein/Creat Ratio 25 - 148 mg/g creat     155 (H)      Nitrites, Initial NEGATIVE   NEGATIVE         !: Data is abnormal (L): Data is abnormally low (H): Data  is abnormally high Rpt: View report in Results Review for more information     Background: Reviewed: He has Primary osteoarthritis of right knee; Weight disorder; FH: heart disease; Osteoarthritis of lower back; Hyperlipidemia, acquired; Thrombocytopenia (HCC); Attention deficit hyperactivity disorder (ADHD); Bipolar 2 disorder (HCC); H/O total knee replacement; Prediabetes; Statin intolerance; Status post total right knee replacement; Pain in left hip; Screening PSA (prostate specific antigen); Chronic kidney disease (CKD), active medical management without dialysis, stage 3 (moderate) (HCC); Hypertension; Coronary artery calcification; Elevated serum creatinine; Kidney stones; and Gallstones on their problem list.  Reviewed: He  has a past medical history of Bipolar 2 disorder (HCC) (11/20/2021), Depression, FH: heart disease (11/20/2021), Osteoarthritis of lower back (11/20/2021), Overweight (11/20/2021), and Thrombocytopenia (HCC) (11/20/2021).  Manually updated:  Problem  Kidney  Stones   On CT 04/2022 for flank pain stone protocol No hydronephrosis. Multiple cysts. Punctate nonobstructing bilateral kidney stones. Bilateral renal cysts for which no imaging follow-up is recommended. The bladder is unremarkable.   Gallstones   CT 04/2023   Chronic Kidney Disease (Ckd), Active Medical Management Without Dialysis, Stage 3 (Moderate) (Hcc)   04/28/23 ultrasound showed Increased echotexture of bilateral kidneys, consistent with medical renal disease. 4//2025 Multiple cysts too tiny to follow up on CT renal stone protocol Status: 04/20/2023: Labs show expected changes in renal function following losartan dose increase with creatinine 1.65 mg/dL (H) (up from 1.61 on 09/60/4540), eGFR 46 mL/min (down from 50 mL/min on 04/09/2023), and BUN 31 mg/dL (H) (up from 24 mg/dL on 98/11/9145). Mild hyponatremia (Na 132 mmol/L) also noted. Assessment: Laboratory changes are consistent with expected hemodynamic effects of RAAS blockade intensification. Per KDIGO guidelines, these changes (<30% creatinine increase) may be acceptable if kidney function stabilizes within 4 weeks, especially when RAAS inhibitors provide long-term kidney protection in CKD patients with hypertension. MANAGEMENT PLAN: Continue current losartan dose with monitoring of renal function in 1-2 weeks. Ensure adequate hydration. Maintain scheduled nephrology consultation for comprehensive CKD management. Adjust BP targets to 130-140/80-85 temporarily while monitoring kidney function stabilization.  Nephrologist: No care team member to display  Medicines:     ARB/ACEI:  titrating losartan    SGLT2 inhibitor:   under consideration  Imaging:  Labwork:  Creat (mg/dL)  Date Value  82/95/6213 1.65 (H)  03/11/2021 1.30   Creatinine, Ser (mg/dL)  Date Value  08/65/7846 1.28  03/26/2023 1.37  03/23/2022 1.23  01/21/2022 1.47 (H)   GFR (mL/min)  Date Value  04/09/2023 58.44 (L)  03/26/2023 53.88 (L)  03/23/2022  61.76   eGFR (mL/min/1.75m2)  Date Value  04/19/2023 46 (L)  04/19/2023 45 (L)  04/09/2023 50 (L)    Lab Results  Component Value Date   NA 132 (L) 04/19/2023   K 3.5 04/19/2023   CL 99 04/19/2023   CO2 21 04/19/2023   CALCIUM 9.5 04/19/2023   PROT 6.5 03/26/2023   ALBUMIN 4.2 03/26/2023   HGB 15.2 03/26/2023   HCT 46.0 03/26/2023   PSA 0.93 03/26/2023   HGBA1C 6.1 03/26/2023   Lab Results  Component Value Date   COLORURINE YELLOW 04/09/2023   APPEARANCEUR CLEAR 04/09/2023   LABSPEC 1.017 04/09/2023   PHURINE 6.0 04/09/2023   GLUCOSEU NEGATIVE 04/09/2023   HGBUR NEGATIVE 04/09/2023   KETONESUR NEGATIVE 04/09/2023   PROTEINUR NEGATIVE 04/09/2023   MICROALBUR <0.7 04/09/2023    Symptoms to Watch For: changes in urination, swelling, fatigue, chest pain, or shortness of breath  Medicines to Avoid:  diuretics (fluid pills), NSAIDs, proton pump  inhibitor (PPI) stomach acid reducer, contrasted imaging studies, aminoglycosides, acyclovir, phosphate-based bowel prep, baclofen  Lifestyle and Nutrition:  Limit sodium, potassium, phosphorus and protein intake, consider nutrition consult, smoking avoidance, exercise regularly, manage weight, and reduce stress, control blood sugar, avoid dehydration (may need early emergency room visits for nausea vomiting diarrhea)  Vaccinations: flu/COVID/pneumonia/hepatitis B vaccinations are more important in kidney disease       Reviewed:  Allergies as of 05/10/2023   (No Known Allergies)    Medications: Reviewed: Current Outpatient Medications on File Prior to Visit  Medication Sig   albuterol (VENTOLIN HFA) 108 (90 Base) MCG/ACT inhaler Inhale 2 puffs into the lungs every 6 (six) hours as needed for wheezing or shortness of breath.   amphetamine-dextroamphetamine (ADDERALL) 20 MG tablet Take 20 mg by mouth 3 (three) times daily.   aspirin EC 81 MG tablet Take 1 tablet (81 mg total) by mouth daily. Swallow whole.   empagliflozin  (JARDIANCE) 10 MG TABS tablet Take 1 tablet (10 mg total) by mouth daily before breakfast.   lamoTRIgine (LAMICTAL) 200 MG tablet Take 200 mg by mouth daily.   lovastatin (MEVACOR) 40 MG tablet Take 1 tablet (40 mg total) by mouth at bedtime.   lurasidone (LATUDA) 80 MG TABS tablet Take 80 mg by mouth daily.   QUEtiapine Fumarate (SEROQUEL XR) 150 MG 24 hr tablet Take 300 mg by mouth at bedtime.   lidocaine (LIDODERM) 5 % Place 1 patch onto the skin daily. Remove & Discard patch within 12 hours or as directed by MD   methocarbamol (ROBAXIN) 500 MG tablet Take 1 tablet (500 mg total) by mouth every 8 (eight) hours as needed for muscle spasms.   No current facility-administered medications on file prior to visit.   Medications Discontinued During This Encounter  Medication Reason   losartan (COZAAR) 100 MG tablet    hydrochlorothiazide (HYDRODIURIL) 25 MG tablet Dose change   amLODipine (NORVASC) 5 MG tablet Reorder   doxazosin (CARDURA) 1 MG tablet    losartan (COZAAR) 25 MG tablet          Physical Exam:    05/10/2023   12:58 PM 05/05/2023   12:45 AM 05/05/2023   12:00 AM  Vitals with BMI  Height 5\' 7"     Weight 168 lbs    BMI 26.31    Systolic 140 149 098  Diastolic 82 77 81  Pulse 68 60 64   Wt Readings from Last 10 Encounters:  05/10/23 168 lb (76.2 kg)  04/26/23 167 lb (75.8 kg)  04/09/23 170 lb 1.6 oz (77.2 kg)  03/26/23 168 lb (76.2 kg)  03/31/22 172 lb 3.2 oz (78.1 kg)  03/23/22 170 lb (77.1 kg)  02/02/22 175 lb (79.4 kg)  01/21/22 174 lb 11.2 oz (79.2 kg)  11/20/21 185 lb 12.8 oz (84.3 kg)  04/07/21 185 lb (83.9 kg)  Vital signs reviewed.  Nursing notes reviewed. Weight trend reviewed. General Appearance:  No acute distress appreciable.   Well-groomed, healthy-appearing male.  Well proportioned with no abnormal fat distribution.  Good muscle tone. Pulmonary:  Normal work of breathing at rest, no respiratory distress apparent. SpO2: 98 %  Musculoskeletal: All  extremities are intact.  Neurological:  Awake, alert, oriented, and engaged.  No obvious focal neurological deficits or cognitive impairments.  Sensorium seems unclouded.   Speech is clear and coherent with logical content. Psychiatric:  Appropriate mood, pleasant and cooperative demeanor, thoughtful and engaged during the exam    Results for orders  placed or performed in visit on 05/10/23  Microalbumin / creatinine urine ratio  Result Value Ref Range   Microalb, Ur <0.7 mg/dL   Creatinine,U 16.1 mg/dL   Microalb Creat Ratio Unable to calculate 0.0 - 30.0 mg/g   Office Visit on 05/10/2023  Component Date Value   Microalb, Ur 05/10/2023 <0.7    Creatinine,U 05/10/2023 13.3    Microalb Creat Ratio 05/10/2023 Unable to calculate   Admission on 05/04/2023, Discharged on 05/05/2023  Component Date Value   Color, Urine 05/04/2023 COLORLESS (A)    APPearance 05/04/2023 CLEAR    Specific Gravity, Urine 05/04/2023 1.007    pH 05/04/2023 5.5    Glucose, UA 05/04/2023 500 (A)    Hgb urine dipstick 05/04/2023 NEGATIVE    Bilirubin Urine 05/04/2023 NEGATIVE    Ketones, ur 05/04/2023 NEGATIVE    Protein, ur 05/04/2023 NEGATIVE    Nitrite 05/04/2023 NEGATIVE    Leukocytes,Ua 05/04/2023 NEGATIVE    RBC / HPF 05/04/2023 0-5    WBC, UA 05/04/2023 0-5    Bacteria, UA 05/04/2023 NONE SEEN    Squamous Epithelial / HPF 05/04/2023 0-5    Sodium 05/04/2023 133 (L)    Potassium 05/04/2023 3.9    Chloride 05/04/2023 101    CO2 05/04/2023 22    Glucose, Bld 05/04/2023 100 (H)    BUN 05/04/2023 22    Creatinine, Ser 05/04/2023 1.97 (H)    Calcium 05/04/2023 9.2    GFR, Estimated 05/04/2023 37 (L)    Anion gap 05/04/2023 10    WBC 05/04/2023 7.1    RBC 05/04/2023 4.99    Hemoglobin 05/04/2023 14.7    HCT 05/04/2023 43.0    MCV 05/04/2023 86.2    MCH 05/04/2023 29.5    MCHC 05/04/2023 34.2    RDW 05/04/2023 13.0    Platelets 05/04/2023 173    nRBC 05/04/2023 0.0   Lab on 05/03/2023   Component Date Value   CYSTATIN C 05/03/2023 1.29 (H)    eGFR 05/03/2023 54 (L)    Sodium 05/03/2023 137    Potassium 05/03/2023 3.7    Chloride 05/03/2023 104    CO2 05/03/2023 24    Glucose, Bld 05/03/2023 101 (H)    BUN 05/03/2023 21    Creatinine, Ser 05/03/2023 1.41    GFR 05/03/2023 52.02 (L)    Calcium 05/03/2023 9.3    Microalb, Ur 05/03/2023 <0.7    Creatinine,U 05/03/2023 65.1    Microalb Creat Ratio 05/03/2023 Unable to calculate   Office Visit on 04/26/2023  Component Date Value   CYSTATIN C 04/26/2023 1.68 (H)    eGFR 04/26/2023 38 (L)    Creatinine, Urine 04/26/2023 58    Protein/Creat Ratio 04/26/2023 155 (H)    Protein/Creatinine Ratio 04/26/2023 0.155 (H)    Total Protein, Urine 04/26/2023 9    Sodium 04/26/2023 134 (L)    Potassium 04/26/2023 3.8    Chloride 04/26/2023 99    CO2 04/26/2023 26    Glucose, Bld 04/26/2023 108 (H)    BUN 04/26/2023 29 (H)    Creatinine, Ser 04/26/2023 1.76 (H)    GFR 04/26/2023 39.87 (L)    Calcium 04/26/2023 9.2   Lab on 04/23/2023  Component Date Value   Sodium 04/23/2023 139    Potassium 04/23/2023 3.5    Chloride 04/23/2023 101    CO2 04/23/2023 26    Glucose, Bld 04/23/2023 141 (H)    BUN 04/23/2023 28 (H)    Creatinine, Ser 04/23/2023 1.55 (H)  GFR 04/23/2023 46.44 (L)    Calcium 04/23/2023 9.4    CYSTATIN C 04/23/2023 1.52 (H)    eGFR 04/23/2023 44 (L)   Lab on 04/19/2023  Component Date Value   Glucose, Bld 04/19/2023 106 (H)    BUN 04/19/2023 31 (H)    Creat 04/19/2023 1.65 (H)    eGFR 04/19/2023 46 (L)    BUN/Creatinine Ratio 04/19/2023 19    Sodium 04/19/2023 132 (L)    Potassium 04/19/2023 3.5    Chloride 04/19/2023 99    CO2 04/19/2023 21    Calcium 04/19/2023 9.5    CYSTATIN C 04/19/2023 1.48 (H)    eGFR 04/19/2023 45 (L)   Office Visit on 04/09/2023  Component Date Value   Sodium 04/09/2023 138    Potassium 04/09/2023 4.4    Chloride 04/09/2023 104    CO2 04/09/2023 26    Glucose, Bld  04/09/2023 75    BUN 04/09/2023 24 (H)    Creatinine, Ser 04/09/2023 1.28    GFR 04/09/2023 58.44 (L)    Calcium 04/09/2023 9.5    Microalb, Ur 04/09/2023 <0.7    Creatinine,U 04/09/2023 84.9    Microalb Creat Ratio 04/09/2023 Unable to calculate    CYSTATIN C 04/09/2023 1.37 (H)    eGFR 04/09/2023 50 (L)    Color, Urine 04/09/2023 YELLOW    APPearance 04/09/2023 CLEAR    Specific Gravity, Urine 04/09/2023 1.017    pH 04/09/2023 6.0    Glucose, UA 04/09/2023 NEGATIVE    Bilirubin Urine 04/09/2023 NEGATIVE    Ketones, ur 04/09/2023 NEGATIVE    Hgb urine dipstick 04/09/2023 NEGATIVE    Protein, ur 04/09/2023 NEGATIVE    Nitrites, Initial 04/09/2023 NEGATIVE    Leukocyte Esterase 04/09/2023 NEGATIVE    WBC, UA 04/09/2023 NONE SEEN    RBC / HPF 04/09/2023 NONE SEEN    Squamous Epithelial / HPF 04/09/2023 NONE SEEN    Bacteria, UA 04/09/2023 NONE SEEN    Hyaline Cast 04/09/2023 NONE SEEN    Note 04/09/2023     Reflexve Urine Culture 04/09/2023    Office Visit on 03/26/2023  Component Date Value   WBC 03/26/2023 4.2    RBC 03/26/2023 5.10    Hemoglobin 03/26/2023 15.2    HCT 03/26/2023 46.0    MCV 03/26/2023 90.0    MCHC 03/26/2023 33.1    RDW 03/26/2023 14.3    Platelets 03/26/2023 144.0 (L)    Neutrophils Relative % 03/26/2023 49.8    Lymphocytes Relative 03/26/2023 26.9    Monocytes Relative 03/26/2023 14.6 (H)    Eosinophils Relative 03/26/2023 5.9 (H)    Basophils Relative 03/26/2023 2.8    Neutro Abs 03/26/2023 2.1    Lymphs Abs 03/26/2023 1.1    Monocytes Absolute 03/26/2023 0.6    Eosinophils Absolute 03/26/2023 0.3    Basophils Absolute 03/26/2023 0.1    Sodium 03/26/2023 139    Potassium 03/26/2023 4.4    Chloride 03/26/2023 106    CO2 03/26/2023 27    Glucose, Bld 03/26/2023 108 (H)    BUN 03/26/2023 22    Creatinine, Ser 03/26/2023 1.37    Total Bilirubin 03/26/2023 0.4    Alkaline Phosphatase 03/26/2023 100    AST 03/26/2023 18    ALT 03/26/2023 20     Total Protein 03/26/2023 6.5    Albumin 03/26/2023 4.2    GFR 03/26/2023 53.88 (L)    Calcium 03/26/2023 9.2    Cholesterol 03/26/2023 190    Triglycerides 03/26/2023 72.0    HDL  03/26/2023 51.70    VLDL 03/26/2023 14.4    LDL Cholesterol 03/26/2023 124 (H)    Total CHOL/HDL Ratio 03/26/2023 4    NonHDL 03/26/2023 138.36    PSA 03/26/2023 0.93    TSH 03/26/2023 0.98    Hgb A1c MFr Bld 03/26/2023 6.1   Lab on 10/01/2022  Component Date Value   Cholesterol 10/01/2022 188    Triglycerides 10/01/2022 97.0    HDL 10/01/2022 47.40    VLDL 10/01/2022 19.4    LDL Cholesterol 10/01/2022 121 (H)    Total CHOL/HDL Ratio 10/01/2022 4    NonHDL 10/01/2022 140.14    PSA 10/01/2022 1.02   No image results found. CT Renal Stone Study Result Date: 05/05/2023 CLINICAL DATA:  Intense flank pain EXAM: CT ABDOMEN AND PELVIS WITHOUT CONTRAST TECHNIQUE: Multidetector CT imaging of the abdomen and pelvis was performed following the standard protocol without IV contrast. RADIATION DOSE REDUCTION: This exam was performed according to the departmental dose-optimization program which includes automated exposure control, adjustment of the mA and/or kV according to patient size and/or use of iterative reconstruction technique. COMPARISON:  Ultrasound 04/28/2023 FINDINGS: Lower chest: Lung bases demonstrate no acute airspace disease. Hepatobiliary: Gallstones. No biliary dilatation. No focal hepatic abnormality Pancreas: Unremarkable. No pancreatic ductal dilatation or surrounding inflammatory changes. Spleen: Normal in size without focal abnormality. Adrenals/Urinary Tract: Adrenal glands are within normal limits. No hydronephrosis. Multiple cysts. Punctate nonobstructing bilateral kidney stones. Bilateral renal cysts for which no imaging follow-up is recommended. The bladder is unremarkable. Stomach/Bowel: Stomach nonenlarged. No dilated small bowel. No acute bowel wall thickening. Moderate to stool burden  Vascular/Lymphatic: Aortic atherosclerosis. No enlarged abdominal or pelvic lymph nodes. Reproductive: Prostate is unremarkable. Other: Negative for pelvic effusion or free air Musculoskeletal: Scoliosis and degenerative changes of the spine. No acute osseous abnormality IMPRESSION: 1. Negative for hydronephrosis or hydroureter. Punctate nonobstructing bilateral kidney stones. 2. Gallstones. 3. Aortic atherosclerosis. Electronically Signed   By: Jasmine Pang M.D.   On: 05/05/2023 00:31   US Renal Result Date: 04/28/2023 CLINICAL DATA:  Chronic kidney disease. EXAM: RENAL / URINARY TRACT ULTRASOUND COMPLETE COMPARISON:  None Available. FINDINGS: Right Kidney: Renal measurements: 10 x 6 x 5.2 cm = volume: 162.9 mL. Mild increased echotexture. No hydronephrosis visualized. Simple cysts are noted, largest measures 2.3 x 2.3 x 2.7 cm, no follow-up is recommended. Left Kidney: Renal measurements: 11 x 6.4 x 6.8 cm = volume: 251.8 mL. Increased echotexture. No hydronephrosis visualized. Simple cysts are noted, largest measures 4.2 x 4 x 4.4 cm, no follow-up is recommended. Bladder: Appears normal for degree of bladder distention. Other: None. IMPRESSION: 1. No acute abnormality identified. 2. Increased echotexture of bilateral kidneys, consistent with medical renal disease. Electronically Signed   By: Sherian Rein M.D.   On: 04/28/2023 09:41   CT CARDIAC SCORING (SELF PAY ONLY) Addendum Date: 04/22/2023 ADDENDUM REPORT: 04/22/2023 03:46 EXAM: OVER-READ INTERPRETATION  CT CHEST The following report is an over-read performed by radiologist Dr. Alcide Clever of [facility] on 04/22/2023. This over-read does not include interpretation of cardiac or coronary anatomy or pathology. The coronary calcium score interpretation by the cardiologist is attached. COMPARISON:  None. FINDINGS: Cardiovascular: Scattered atherosclerotic calcifications of the aorta are noted. No aneurysmal dilatation is seen.  Mediastinum/Nodes: There are no enlarged lymph nodes within the visualized mediastinum. Lungs/Pleura: There is no pleural effusion. The visualized lungs appear clear. Upper abdomen: No significant findings in the visualized upper abdomen. Musculoskeletal/Chest wall: No chest wall mass or suspicious  osseous findings within the visualized chest. IMPRESSION: Aortic Atherosclerosis (ICD10-I70.0). Electronically Signed   By: Alcide Clever M.D.   On: 04/22/2023 03:46   Result Date: 04/22/2023 CLINICAL DATA:  cardiovascular disease risk stratification EXAM: Coronary Calcium Score TECHNIQUE: A gated, non-contrast computed tomography scan of the heart was performed using 3mm slice thickness. Axial images were analyzed on a dedicated workstation. Calcium scoring of the coronary arteries was performed using the Agatston method. FINDINGS: Coronary Calcium Score: Left main: 53.6 Left anterior descending artery: 55.5 Left circumflex artery: 0 Right coronary artery: 0 Total: 109 Percentile: 54th Aortic atherosclerosis. Pericardium: Normal. Non-cardiac: See separate report from [facility]. IMPRESSION: 1. Coronary calcium score of 109. This was 54th percentile for age-, race-, and sex-matched controls. RECOMMENDATIONS: Coronary artery calcium (CAC) score is a strong predictor of incident coronary heart disease (CHD) and provides predictive information beyond traditional risk factors. CAC scoring is reasonable to use in the decision to withhold, postpone, or initiate statin therapy in intermediate-risk or selected borderline-risk asymptomatic adults (age 10-75 years and LDL-C >=70 to <190 mg/dL) who do not have diabetes or established atherosclerotic cardiovascular disease (ASCVD).* In intermediate-risk (10-year ASCVD risk >=7.5% to <20%) adults or selected borderline-risk (10-year ASCVD risk >=5% to <7.5%) adults in whom a CAC score is measured for the purpose of making a treatment decision the following recommendations  have been made: If CAC=0, it is reasonable to withhold statin therapy and reassess in 5 to 10 years, as long as higher risk conditions are absent (diabetes mellitus, family history of premature CHD in first degree relatives (males <55 years; females <65 years), cigarette smoking, or LDL >=190 mg/dL). If CAC is 1 to 99, it is reasonable to initiate statin therapy for patients >=19 years of age. If CAC is >=100 or >=75th percentile, it is reasonable to initiate statin therapy at any age. Cardiology referral should be considered for patients with CAC scores >=400 or >=75th percentile. *2018 AHA/ACC/AACVPR/AAPA/ABC/ACPM/ADA/AGS/APhA/ASPC/NLA/PCNA Guideline on the Management of Blood Cholesterol: A Report of the American College of Cardiology/American Heart Association Task Force on Clinical Practice Guidelines. J Am Coll Cardiol. 2019;73(24):3168-3209. Chilton Si, MD Electronically Signed: By: Chilton Si M.D. On: 04/07/2023 16:28      Results LABS GFR: 39 (04/26/2023) GFR: 53 (05/03/2023) Cystatin C: 1.68 Cystatin C: 1.29 Microalbumin: 155 (04/26/2023)  RADIOLOGY Renal ultrasound: Normal, no cysts Abdominal CT: Small kidney stones, gallstones, tiny cysts, no kidney blockage     Clinical Summary: 68 year old male with hypertension and CKD stage 3a presenting with acute kidney injury and fluctuating renal function. Recent GFR improvement from 39 to 52 after discontinuation of losartan. Incidental bilateral kidney stones and gallstones on imaging. Elevated blood pressure with home readings 150-160s/80-90s.   Assessment & Plan Injury of kidney, unspecified laterality, subsequent encounter   Acute Kidney Injury superimposed on CKD Stage 3a (N17.9, N18.3)  Assessment: Patient with known CKD stage 3a experienced AKI with creatinine rising from 1.28 to 1.76 (37% increase) after losartan dose increase. GFR declined from 58 to 40, now improved to 52 after losartan discontinuation. Cystatin C  similarly improved from 1.68 to 1.29. Critical Labs: Most recent creatinine 1.41 mg/dL (improved from peak of 1.97 during hospitalization). Hospital admission on 4/8 showed transient worsening with creatinine 1.97 and significant glucosuria (500 mg/dL). Renal ultrasound showed bilateral renal cysts and increased echotexture consistent with medical renal disease. CT demonstrated non-obstructing bilateral kidney stones.  Plan: Restart losartan at reduced dose (25 mg BID) with careful monitoring of renal function Avoid  potentially nephrotoxic medications including NSAIDs Monitor electrolytes, renal function at 1 week then monthly Maintain adequate hydration, especially during physical activity Nephrology referral for comprehensive CKD management Monitor urine microalbumin for proteinuria assessment   Hypertension, unspecified type Hypertension, Uncontrolled (I10)  Assessment: Blood pressure remains elevated despite medication adjustments. Home readings in 150-160s/80-90s, office reading today 140/82. Discontinued losartan previously due to renal function decline, but hypertension requires improved control. Currently on amlodipine 10 mg daily, doxazosin, and Jardiance. Recent BP-related medication changes have been complicated by kidney function fluctuations.  Plan: Adjust antihypertensive regimen:  Restart losartan 25 mg BID (reduced dose from previous 100 mg daily) Continue amlodipine 5 mg BID (total 10 mg daily) Continue doxazosin 1 mg BID Discontinue hydrochlorothiazide to protect renal function Continue Jardiance 10 mg daily for additional BP benefit Home BP monitoring 2-3 times daily with log Target BP <140/90, with slower titration to protect renal function Sodium restriction to <2g daily Follow-up in 1 week to assess response and renal function   Gallstones Cholelithiasis, Asymptomatic (K80.20)  Assessment: Incidental finding of gallstones on CT scan 05/05/2023. Patient reports no  symptoms of biliary colic, no right upper quadrant pain, no nausea/vomiting after fatty meals, no jaundice. No biliary ductal dilation noted on imaging.  Plan: Expectant management - no intervention needed for asymptomatic gallstones Dietary counseling on low-fat diet to reduce symptoms Patient education on warning signs of acute cholecystitis or biliary obstruction No specific follow-up imaging required for asymptomatic gallstones   Kidney stones Nephrolithiasis, Bilateral (N20.0)  Assessment: CT 05/05/2023 confirmed "punctate nonobstructing bilateral kidney stones." No current symptoms of renal colic. History of dehydration, particularly when not actively engaged in physical activity. No hydronephrosis present. Patient plays hockey and reports adequate hydration during activity but less consistent hydration during daily activities.  Plan: Increase fluid intake to >2.5L daily, maintain consistent hydration throughout the day Dietary modifications:  Low sodium diet (<2g daily) Moderate protein intake Adequate calcium from dietary sources (avoids supplements) Strain urine if experiencing symptoms of stone passage Preventive education provided about stone risk factors see AVS. Follow-up imaging in 6 months to assess stone burden   Polycystic kidney disease Possible due to tiny cysts noted on CT kidney not needing follow up- may also be due to stones.  RETURN PRECAUTIONS - SEEK IMMEDIATE MEDICAL ATTENTION for: Severe flank pain suggestive of renal colic or stone obstruction Significant decline in urine output Blood pressure >180/110 with symptoms (headache, vision changes) Severe abdominal pain, particularly in right upper quadrant Fever with any of the above symptoms     FOLLOW-UP PLAN: ?? Labs: BMP and Cystatin C in 1 week (05/17/2023) ?? Office Visit: Return in 1 week to evaluate renal function and BP response ???? Nephrology Referral: To be scheduled for CKD management ?? Home  Monitoring: BP twice daily, maintain log     COMPREHENSIVE MONITORING PLAN: ?? Immediate: Home BP monitoring twice daily; maintain adequate hydration ?? One Week (05/17/2023): BMP, Cystatin C, office visit to assess medication response ?? One Month: Urine microalbumin/creatinine ratio, comprehensive metabolic panel ???? Three Months: Lipid panel, HbA1c, nephrology consultation ?? Six Months: Follow-up imaging for kidney stones if indicated            Orders Placed During this Encounter:   Orders Placed This Encounter  Procedures   Microalbumin / creatinine urine ratio    Standing Status:   Future    Expiration Date:   05/09/2024   Basic Metabolic Panel (BMET)   Cystatin C with Glomerular Filtration Rate,  Estimated (eGFR)   Basic Metabolic Panel (BMET)    Standing Status:   Future    Expected Date:   05/17/2023    Expiration Date:   05/09/2024   Microalbumin / creatinine urine ratio    [place]   Meds ordered this encounter  Medications   DISCONTD: losartan (COZAAR) 25 MG tablet    Sig: Take 1 tablet (25 mg total) by mouth daily.    Dispense:  90 tablet    Refill:  3   amLODipine (NORVASC) 5 MG tablet    Sig: Take 1 tablet (5 mg total) by mouth in the morning and at bedtime.    Dispense:  90 tablet    Refill:  3   doxazosin (CARDURA) 1 MG tablet    Sig: Take 1 tablet (1 mg total) by mouth 2 (two) times daily.    Dispense:  90 tablet    Refill:  3   losartan (COZAAR) 25 MG tablet    Sig: Take 1 tablet (25 mg total) by mouth in the morning and at bedtime.    Dispense:  180 tablet    Refill:  3   Medical Decision Making: 2 or more stable chronic illnesses     Ordering of each unique test; Prescription drug management            Additional Info: This encounter employed state-of-the-art, real-time, collaborative documentation. The patient actively reviewed and assisted in updating their electronic medical record on a shared screen, ensuring transparency and  facilitating joint problem-solving for the problem list, overview, and plan. This approach promotes accurate, informed care. The treatment plan was discussed and reviewed in detail, including medication safety, potential side effects, and all patient questions. We confirmed understanding and comfort with the plan. Follow-up instructions were established, including contacting the office for any concerns, returning if symptoms worsen, persist, or new symptoms develop, and precautions for potential emergency department visits.   This document was synthesized by artificial intelligence (Abridge) using HIPAA-compliant recording of the clinical interaction;   We discussed the use of AI scribe software for clinical note transcription with the patient, who gave verbal consent to proceed.

## 2023-05-10 NOTE — Assessment & Plan Note (Signed)
 Cholelithiasis, Asymptomatic (K80.20)  Assessment: Incidental finding of gallstones on CT scan 05/05/2023. Patient reports no symptoms of biliary colic, no right upper quadrant pain, no nausea/vomiting after fatty meals, no jaundice. No biliary ductal dilation noted on imaging.  Plan: Expectant management - no intervention needed for asymptomatic gallstones Dietary counseling on low-fat diet to reduce symptoms Patient education on warning signs of acute cholecystitis or biliary obstruction No specific follow-up imaging required for asymptomatic gallstones

## 2023-05-10 NOTE — Assessment & Plan Note (Signed)
 Hypertension, Uncontrolled (I10)  Assessment: Blood pressure remains elevated despite medication adjustments. Home readings in 150-160s/80-90s, office reading today 140/82. Discontinued losartan previously due to renal function decline, but hypertension requires improved control. Currently on amlodipine 10 mg daily, doxazosin, and Jardiance. Recent BP-related medication changes have been complicated by kidney function fluctuations.  Plan: Adjust antihypertensive regimen:  Restart losartan 25 mg BID (reduced dose from previous 100 mg daily) Continue amlodipine 5 mg BID (total 10 mg daily) Continue doxazosin 1 mg BID Discontinue hydrochlorothiazide to protect renal function Continue Jardiance 10 mg daily for additional BP benefit Home BP monitoring 2-3 times daily with log Target BP <140/90, with slower titration to protect renal function Sodium restriction to <2g daily Follow-up in 1 week to assess response and renal function

## 2023-05-10 NOTE — Assessment & Plan Note (Signed)
   Acute Kidney Injury superimposed on CKD Stage 3a (N17.9, N18.3)  Assessment: Patient with known CKD stage 3a experienced AKI with creatinine rising from 1.28 to 1.76 (37% increase) after losartan dose increase. GFR declined from 58 to 40, now improved to 52 after losartan discontinuation. Cystatin C similarly improved from 1.68 to 1.29. Critical Labs: Most recent creatinine 1.41 mg/dL (improved from peak of 1.97 during hospitalization). Hospital admission on 4/8 showed transient worsening with creatinine 1.97 and significant glucosuria (500 mg/dL). Renal ultrasound showed bilateral renal cysts and increased echotexture consistent with medical renal disease. CT demonstrated non-obstructing bilateral kidney stones.  Plan: Restart losartan at reduced dose (25 mg BID) with careful monitoring of renal function Avoid potentially nephrotoxic medications including NSAIDs Monitor electrolytes, renal function at 1 week then monthly Maintain adequate hydration, especially during physical activity Nephrology referral for comprehensive CKD management Monitor urine microalbumin for proteinuria assessment

## 2023-05-11 LAB — BASIC METABOLIC PANEL WITH GFR
BUN: 20 mg/dL (ref 6–23)
CO2: 25 meq/L (ref 19–32)
Calcium: 9.1 mg/dL (ref 8.4–10.5)
Chloride: 104 meq/L (ref 96–112)
Creatinine, Ser: 1.18 mg/dL (ref 0.40–1.50)
GFR: 64.4 mL/min (ref >60.00)
Glucose, Bld: 96 mg/dL (ref 70–99)
Potassium: 4 meq/L (ref 3.5–5.1)
Sodium: 137 meq/L (ref 135–145)

## 2023-05-12 LAB — CYSTATIN C WITH GLOMERULAR FILTRATION RATE, ESTIMATED (EGFR)
CYSTATIN C: 1.25 mg/L — ABNORMAL HIGH (ref 0.52–1.20)
eGFR: 56 mL/min/{1.73_m2} — ABNORMAL LOW (ref >=60)

## 2023-05-17 ENCOUNTER — Ambulatory Visit (INDEPENDENT_AMBULATORY_CARE_PROVIDER_SITE_OTHER): Admitting: Internal Medicine

## 2023-05-17 ENCOUNTER — Encounter: Payer: Self-pay | Admitting: Internal Medicine

## 2023-05-17 VITALS — BP 122/62 | HR 100 | Temp 98.0°F | Ht 67.0 in | Wt 165.2 lb

## 2023-05-17 DIAGNOSIS — E785 Hyperlipidemia, unspecified: Secondary | ICD-10-CM

## 2023-05-17 DIAGNOSIS — I2510 Atherosclerotic heart disease of native coronary artery without angina pectoris: Secondary | ICD-10-CM

## 2023-05-17 DIAGNOSIS — I10 Essential (primary) hypertension: Secondary | ICD-10-CM | POA: Diagnosis not present

## 2023-05-17 DIAGNOSIS — N179 Acute kidney failure, unspecified: Secondary | ICD-10-CM | POA: Diagnosis not present

## 2023-05-17 DIAGNOSIS — I1A0 Resistant hypertension: Secondary | ICD-10-CM

## 2023-05-17 DIAGNOSIS — N183 Chronic kidney disease, stage 3 unspecified: Secondary | ICD-10-CM

## 2023-05-17 MED ORDER — DOXAZOSIN MESYLATE 2 MG PO TABS: 2.0000 mg | ORAL_TABLET | Freq: Two times a day (BID) | ORAL | 3 refills | Status: DC

## 2023-05-17 NOTE — Patient Instructions (Addendum)
 AI Overview Learn more How to get better sleep with your Android "Sleep as Android" is an app for Android smartphones that tracks sleep patterns and offers a "smart alarm" feature that wakes you gently during your lightest sleep phase. It uses your phone's sensors to monitor movement and sounds during sleep, providing data on sleep duration, stages, and interruptions. The app also offers features like lullabies, smart wake-up, and sleep-related notification    Looking for a fingertip oxygen sensor The user is looking for a fingertip oxygen sensor that records overnight, works with Android, and is reliable, well-reviewed, and offers good value. My approach will be to search for such devices, aiming to provide several options backed by multiple citations. I'll focus on including at least 10 sources across different domains to ensure the reliability of my recommendations. I should also present the findings with clear headers for easy reading, though I might skip the image carousel. Let's dive in! Considering pulse oximeters I'm gathering sources to find a good pulse oximeter for the user. Some places to check: Wirecutter, which has lists for "best pulse oximeters," and Sleep Foundation's guide about those for sleep. There are also reviews from [employer], [employer], and [employer], like for the [employer] and [employer]. I'll also see if ChoiceMMed or [employer]'s websites offer useful details, along with some other reliable sites like TrustedReviews and [employer]. Let's see what I find! Refining the search for devices The user's asking for a fingertip oxygen sensor that records overnight sleep data and works with Android. I'm focusing on finding devices that meet these conditions, but there's some confusion. The [facility] O2Ring is a ring, not a fingertip clip. The SleepO2 from [facility] has a finger sensor, but it's still worn on the wrist. It seems like the user is looking for a specific  device with a fingertip clip, but a ring could still work--though it's technically not the same as a clip. Choosing the right fingertip oxygen sensor The user is looking for a fingertip sensor, likely a clip style. I'm considering the ChoiceMMed [id number], which is a clip-on, Bluetooth-enabled sensor that records SpO2 and heart rate overnight. It works with an Pension scheme manager and is fairly affordable, around $40-$60. The [id number] is a simpler model, but for Bluetooth functionality, the [id number] is ideal because it connects to the [facility] Health app. This seems to match the user's requirements! Looking into fingertip sensors The [facility] Bluetooth Pulse Oximeter uses a clip with Bluetooth and an app but I'm not sure if it records overnight. The ChoiceMMed [id number], Mibest [id number], and SmileCare are all clip-style devices that might work--let's focus on those for now. The Mibest [id number] looks promising, but I need to search more about its overnight recording capabilities. I'll look into it and see what I can find. Evaluating pulse oximeter options I found several options for the user's request. The ChoiceMMed [id number] is a Bluetooth-enabled fingertip sensor that works with the MedM app to record data continuously and is medical-grade. Price is around $60-$80. The [facility] SleepO2 is a wrist device with a finger probe, not a fingertip clip, which may not match the request. The Mibest [id number] is a clip-on Bluetooth device good for real-time recording, but I'm uncertain if it records overnight. There's also the SmileCare and EMAY Sleep Oxygen Monitor, and the iSpO2 Wellue O2Ring, but it's a ring, not a clip. Structuring the response I have enough sources now. I'll start with a summary paragraph that highlights the key findings. After  that, I can structure the response with sections: "Top Recommendation," "Runner-up," and "Budget Option." I'll discuss topics like the difference between a ring device and a fingertip clip  and provide considerations. I want to include about 10 citations from reliable sources like MedM, Sleep Foundation, and DyeCasts.nl. While we can mention Reddit for user experiences, I'm prioritizing trusted reviews to ensure reliability.  It looks like the Mibest [phone] does not store data on the device itself and requires the app to be open for syncing, which may not be ideal for overnight use. In contrast, devices like the ChoiceMMed [id number] do store data and upload it later, and the iSpO2 and Wellue rings have memory built into them to track and store multiple hours of data. While rings are technically fingertip sensors, the user specifically mentioned a fingertip sensor with clip. Still, it may be worth mentioning rings as an alternative.  The [facility] product is Bluetooth-enabled and marketed for spot checks, not continuous overnight recording. While it does offer "Data Recording," it's unclear if it allows overnight use, and it likely lacks device memory. Some reviews suggest it can store data via the app. However, for continuous, overnight recording, I think the [facility] SleepO2 is a better option. Though it uses a wristband with a finger probe (not a clip sensor), it records SpO2 and heart rate throughout the night, making it a suitable alternative.  Improving Your Cholesterol: Diet: Focus on a Mediterranean-style diet, limit saturated fats and sugars, and increase omega-3 fatty acids (fish, flaxseeds,nuts,extra virgin olive oil, avocados). Exercise: Engage in regular physical activity (aerobic exercises are particularly beneficial for HDL). Weight Management: Maintain a healthy weight.  The [facility] SleepO2 wrist device is a good option for overnight SpO2 and heart rate recording. Although it's a wrist device, it uses a silicone finger probe, which qualifies it as a fingertip sensor. It records data continuously throughout the night and uploads to the wrist unit, providing memory storage. The price  is reasonable at $39.99, and it's well-reviewed, with 4.3 stars and over 1,000 buyers. This makes it a strong alternative for overnight use. Classifying options To divide options into categories, I'll sort the devices into four types: Finger probe with wrist recorder (EMAY SleepO2). Bluetooth clip (ChoiceMMed [id number]). Bluetooth clip (Mibest [id number]). Ring (Wellue O2Ring). I'll use credible sources like the Sleep Foundation and review sites for citations. For example, I'll cite the [facility] SleepO2's continuous recording feature, and also highlight pricing and app sync features for each device. Thought for 56 seconds Summary For reliable overnight fingertip SpO? recording on Android, the ChoiceMMed [id number] stands out as the top pick: it's a medical-grade, Bluetooth-enabled clip device integrating seamlessly with the free MedM Health app for continuous data logging, exportable reports, and optional cloud backup ?cite?turn1search1?turn1search3?. A budget-friendly alternative is the [facility] SleepO? Wrist Recording Pulse Oximeter, which uses a soft silicone fingertip probe to record every second of SpO? and heart rate throughout the night on a wrist unit, then syncs via app in the morning for report generation, all under $40 ?cite?turn0search2?. If you prefer a true clip-on sensor, the Mibest [id number] offers real-time syncing to the free iChoice app on Android for about $26, though user ratings are mixed and its 1-star average suggests cautious consideration ?cite?turn3view0?Aaron Aas Ring-style sensors like the Wellue O? Ring and LOOKEE Ring provide comfortable finger-worn monitoring with built-in memory and vibration alerts, plus Android app support, but may not suit those specifically wanting a clip device ?cite?turn0search1?turn2search0?.  Top Recommendation: ChoiceMMed [id number] Professional-Grade Accuracy &  Approval The [id number] is CE, CFDA, and FDA approved as a medical-grade sensor with a responsive color  OLED display and perfusion index waveform ?cite?turn1search3?. Advertising copywriter Via Bluetooth it syncs automatically to the [employer], logging continuous SpO? and pulse rate data in real time or spot-check modes, with optional encrypted cloud backup and exportable CSV/PDF history files ?cite?turn1search1?turn1search3?. Overnight Recording & Value While priced around $60-$80 at specialty retailers, its robust build, automated data capture, and strong accuracy make it a reliable overnight monitor and excellent long-term value ?cite?turn0search0?.  Runner-Up: EMAY SleepO? Wrist Recording Device Design & Function A stand-alone wrist unit with a patented silicone soft finger probe records SpO? and heart rate every second continuously through the night ?cite?turn0search2?Aaron Aas Morning Sync & Reports After recording, simply connect via Bluetooth to the [facility] app on Android to upload data, generate sleep reports, and export raw data ?cite?turn0search2?. Comfort & Price At around $39.99 with strong user uptake (1 K+ sold) and 4.3 stars, it balances overnight comfort with affordability ?cite?turn0search2?.  Budget Clip-On Option: Mibest [id number] Real-Time Android App The CN368R fingertip oximeter clips on and syncs to the free iChoice app, storing all measurement history and allowing PDF export for physician review ?cite?turn3view0?Aaron Aas Ultra-Low Price At about $25.99, it's one of the most economical Bluetooth pulse oximeters available ?cite?turn3view0?Aaron Aas Reliability Caution Note its 1.0-star Amazon rating, suggesting possible quality or consistency issues--best suited for non-critical spot-checks rather than definitive sleep monitoring ?cite?turn3view0?Aaron Aas  Alternative Ring Devices Wellue O? Ring Praised by [employer] for easy setup and reliable SpO? tracking, with [employer] app support and vibration alerts on low thresholds ?cite?turn0search1?. Health.com also names Wellue as the  best pulse oximeter with an app, noting its user-friendly interface ?cite?turn0search7?. LOOKEE Ring Offers built-in memory for up to four 10-hour sessions and automatic upload to its Android-compatible app, plus vibration reminders and up to 16 hours' battery life ?cite?turn2search0?. Consideration Rings are less obtrusive than clips, but if you prefer a traditional fingertip clamp, stick with the clip-based options above.  Additional Considerations Comfort & Fit: Clips can pinch or feel tight, especially overnight. Rings may offer better comfort but uneven fit across finger sizes ?cite?turn0search9?. Battery Life: Ensure at least 8-16 hours of operation to cover typical sleep durations without recharging. Data Storage: Offline recording (EMAY, LOOKEE, Wellue) vs. real-time app-only (Mibest) affects convenience--offline memory is more foolproof. App Usability: Look for apps that offer clear sleep reports, trend graphs, and easy export options. Choosing between these options will depend on your priorities--whether it's the highest data fidelity and cloud backup (ChoiceMMed), budget constraints (EMAY, Mibest), or comfort and discrete form factor (Wellue, LOOKEE).

## 2023-05-18 DIAGNOSIS — I10 Essential (primary) hypertension: Secondary | ICD-10-CM | POA: Diagnosis not present

## 2023-05-18 LAB — BASIC METABOLIC PANEL WITH GFR
BUN: 25 mg/dL — ABNORMAL HIGH (ref 6–23)
CO2: 22 meq/L (ref 19–32)
Calcium: 9.5 mg/dL (ref 8.4–10.5)
Chloride: 102 meq/L (ref 96–112)
Creatinine, Ser: 1.43 mg/dL (ref 0.40–1.50)
GFR: 51.13 mL/min — ABNORMAL LOW (ref >60.00)
Glucose, Bld: 96 mg/dL (ref 70–99)
Potassium: 4.3 meq/L (ref 3.5–5.1)
Sodium: 135 meq/L (ref 135–145)

## 2023-05-18 LAB — MICROALBUMIN / CREATININE URINE RATIO
Creatinine,U: 19 mg/dL
Microalb Creat Ratio: UNDETERMINED mg/g (ref 0.0–30.0)
Microalb, Ur: <0.7 mg/dL

## 2023-05-18 NOTE — Assessment & Plan Note (Signed)
 Chronic Kidney Disease Stage 3a with Acute Kidney Injury   Chronic kidney disease stage 3a with acute kidney injury shows improved kidney function with current management. Losartan  can strain kidneys during initial dosing, but current creatinine levels are well-managed with ongoing monitoring. Discussed the risk of kidney strain with losartan  and the importance of monitoring creatinine and cystatin C levels. Monitor kidney function with creatinine and cystatin C levels. Continue current losartan  dosing and monitor for signs of kidney strain. Referral to nephrology for further management, though the appointment may be delayed.

## 2023-05-18 NOTE — Assessment & Plan Note (Signed)
 Blood pressure remains uncontrolled, with evening readings between 140s and 170s and morning readings between 130s and 140s. Current treatment includes losartan  and amlodipine , with limited losartan  dose increase due to potential kidney strain. Doxazosin  is used to manage blood pressure and relieve kidney strain, with no dizziness reported at the current dose. Sleep apnea may contribute to hypertension. Increase doxazosin  to 2 mg twice daily and monitor for dizziness, adjusting the dose if necessary. Continue losartan  25 mg twice daily and amlodipine  5 mg twice daily. Explore sleep apnea monitoring device options.

## 2023-05-18 NOTE — Assessment & Plan Note (Signed)
 Hyperlipidemia is managed with lovastatin . LDL levels have improved but remain elevated, and he experiences muscle soreness with stronger statins. Triglyceride levels are excellent, and HDL levels are improving. Discussed dietary modifications and potential fish oil supplementation if covered by insurance. Continue lovastatin  therapy. Implement dietary modifications to include extra virgin olive oil, avocados, and omega-3 fatty acids. Engage in regular physical activity. Consider fish oil supplementation if insurance covers it.

## 2023-05-18 NOTE — Assessment & Plan Note (Signed)
 He has coronary artery disease with plaque in the left main artery, posing a risk for sudden cardiac events. Current management includes aspirin  therapy. A cardiology consult is scheduled for further evaluation. Discussed the risk of sudden cardiac death if the plaque ruptures and the importance of aspirin  therapy. Consider a portable EKG device for home monitoring. Continue aspirin  therapy and attend the cardiology consult on June 4th for further evaluation and potential stress test or echocardiogram. Consider purchasing a portable EKG device (Cardia) for home heart rhythm monitoring.

## 2023-05-19 NOTE — Progress Notes (Signed)
 Kidney filtration rate (GFR) went down slightly as expected with increased dose losartan  but not enough to alter the plan and we have close follow up scheduled. Lab Results      Component                Value               Date/Time                 GFR                      51.13 (L)           05/17/2023 04:21 PM       GFR                      64.40               05/10/2023 01:29 PM       GFR                      52.02 (L)           05/03/2023 10:50 AM       GFR                      39.87 (L)           04/26/2023 04:02 PM       GFR                      46.44 (L)           04/23/2023 11:17 AM

## 2023-05-21 LAB — CYSTATIN C WITH GLOMERULAR FILTRATION RATE, ESTIMATED (EGFR)
CYSTATIN C: 1.44 mg/L — ABNORMAL HIGH (ref 0.52–1.20)
eGFR: 47 mL/min/{1.73_m2} — ABNORMAL LOW (ref >=60)

## 2023-05-23 ENCOUNTER — Encounter: Payer: Self-pay | Admitting: Internal Medicine

## 2023-05-23 DIAGNOSIS — N179 Acute kidney failure, unspecified: Secondary | ICD-10-CM

## 2023-05-24 NOTE — Addendum Note (Signed)
 Addended by: Venisa Frampton G on: 05/24/2023 12:57 PM   Modules accepted: Orders

## 2023-05-28 ENCOUNTER — Encounter: Payer: Self-pay | Admitting: Internal Medicine

## 2023-05-28 ENCOUNTER — Ambulatory Visit (INDEPENDENT_AMBULATORY_CARE_PROVIDER_SITE_OTHER): Admitting: Internal Medicine

## 2023-05-28 VITALS — BP 130/60 | HR 70 | Temp 98.8°F | Ht 67.0 in | Wt 166.2 lb

## 2023-05-28 DIAGNOSIS — S37009A Unspecified injury of unspecified kidney, initial encounter: Secondary | ICD-10-CM | POA: Diagnosis not present

## 2023-05-28 DIAGNOSIS — N179 Acute kidney failure, unspecified: Secondary | ICD-10-CM | POA: Diagnosis not present

## 2023-05-28 DIAGNOSIS — I10 Essential (primary) hypertension: Secondary | ICD-10-CM

## 2023-05-28 DIAGNOSIS — N200 Calculus of kidney: Secondary | ICD-10-CM | POA: Diagnosis not present

## 2023-05-28 DIAGNOSIS — N183 Chronic kidney disease, stage 3 unspecified: Secondary | ICD-10-CM | POA: Diagnosis not present

## 2023-05-28 LAB — URINALYSIS, ROUTINE W REFLEX MICROSCOPIC
Bilirubin Urine: NEGATIVE
Hgb urine dipstick: NEGATIVE
Ketones, ur: NEGATIVE
Leukocytes,Ua: NEGATIVE
Nitrite: NEGATIVE
Specific Gravity, Urine: 1.01 (ref 1.000–1.030)
Total Protein, Urine: NEGATIVE
Urine Glucose: >=1000 — AB
Urobilinogen, UA: 0.2 (ref 0.0–1.0)
pH: 6 (ref 5.0–8.0)

## 2023-05-28 LAB — BASIC METABOLIC PANEL WITH GFR
BUN: 24 mg/dL — ABNORMAL HIGH (ref 6–23)
CO2: 25 meq/L (ref 19–32)
Calcium: 9.2 mg/dL (ref 8.4–10.5)
Chloride: 103 meq/L (ref 96–112)
Creatinine, Ser: 1.54 mg/dL — ABNORMAL HIGH (ref 0.40–1.50)
GFR: 46.77 mL/min — ABNORMAL LOW (ref >60.00)
Glucose, Bld: 117 mg/dL — ABNORMAL HIGH (ref 70–99)
Potassium: 4.7 meq/L (ref 3.5–5.1)
Sodium: 136 meq/L (ref 135–145)

## 2023-05-28 MED ORDER — EMPAGLIFLOZIN 25 MG PO TABS: 25.0000 mg | ORAL_TABLET | Freq: Every day | ORAL | 3 refills | Status: DC

## 2023-05-28 MED ORDER — LOSARTAN POTASSIUM 25 MG PO TABS
37.5000 mg | ORAL_TABLET | Freq: Two times a day (BID) | ORAL | 3 refills | Status: DC
Start: 2023-05-28 — End: 2023-06-23

## 2023-05-28 NOTE — Progress Notes (Signed)
 ==============================  [facility]: [phone]   -- Medical Office Visit --  Patient: Eric Crane      Age: 68 y.o.       Sex:  male  Date:   05/28/2023 Today's Healthcare Provider: Anthon Kins, MD  ==============================   Chief Complaint: Hypertension (Pt states he has been doing good bp not over 145 now.)  History of Present Illness 68 year old male with hypertension and chronic kidney disease who presents for follow-up on blood pressure management and kidney function.  He has been managing hypertension and chronic kidney disease, noting an improvement in blood pressure since increasing the dose of losartan . Recent readings are mostly in the mid-130s over 70s, improved from previous readings in the 150s. He has not experienced any noticeable side effects from his medications.  He is closely monitoring his kidney function, observing a drop in cystatin C-based estimated glomerular filtration rate (eGFR) from 56 to 47, a 20% decrease. He recalls a previous instance where a high dose of losartan  was reduced due to a significant drop in kidney function. No proteinuria is present. He attributes past kidney damage to high blood pressure and extensive use of Advil . He is currently taking losartan  25 mg twice daily and Jardiance , which costs about $40 and has not caused any side effects.  He has seen a nutritionist but found the visit unhelpful, as he was told his current diet is adequate. He has reduced his sugar and carbohydrate intake and primarily consumes chicken or [place] for protein. He is not on a high-protein diet and consumes peanut butter as part of his protein intake.  No urinary symptoms such as frequent urination or urinary tract infections. He has not experienced any kidney stones recently.    Background Reviewed: Problem List: has Primary osteoarthritis of right knee; Weight disorder; FH: heart disease;  Osteoarthritis of lower back; Hyperlipidemia, acquired; Thrombocytopenia (HCC); Attention deficit hyperactivity disorder (ADHD); Bipolar 2 disorder (HCC); H/O total knee replacement; Prediabetes; Statin intolerance; Status post total right knee replacement; Pain in left hip; Screening PSA (prostate specific antigen); Chronic kidney disease (CKD), active medical management without dialysis, stage 3 (moderate) (HCC); Hypertension; Coronary artery calcification; Elevated serum creatinine; Nephrolithiasis; Gallstones; and Injury to kidney on their problem list. Medications:  has a current medication list which includes the following prescription(s): albuterol , amlodipine , amphetamine-dextroamphetamine, aspirin  ec, doxazosin , doxazosin , empagliflozin , empagliflozin , lamotrigine , losartan , lovastatin , lurasidone, and quetiapine  fumarate.  Allergies:  has no known allergies.  Past Medical History:  has a past medical history of Bipolar 2 disorder (HCC) (11/20/2021), Depression, FH: heart disease (11/20/2021), Osteoarthritis of lower back (11/20/2021), Overweight (11/20/2021), and Thrombocytopenia (HCC) (11/20/2021). Past Surgical History:   has a past surgical history that includes Colonoscopy with propofol  (N/A, 02/18/2016); Tonsillectomy; and Total knee arthroplasty (Right, 02/02/2022). Social History:   reports that he has never smoked. He has never used smokeless tobacco. He reports that he does not drink alcohol and does not use drugs. Family History:  family history includes Heart disease in his mother. Depression Screen and Health Maintenance:    04/09/2023    8:47 AM 03/26/2023    8:36 AM 03/23/2022    9:53 AM 11/20/2021    8:51 AM  PHQ 2/9 Scores  PHQ - 2 Score 0 0 0 0    Medication Reconciliation: Current Outpatient Medications on File Prior to Visit  Medication Sig   albuterol  (VENTOLIN  HFA) 108 (90 Base) MCG/ACT inhaler Inhale 2 puffs into the  lungs every 6 (six) hours as needed for wheezing or  shortness of breath.   amLODipine  (NORVASC ) 5 MG tablet Take 1 tablet (5 mg total) by mouth in the morning and at bedtime.   amphetamine-dextroamphetamine (ADDERALL) 20 MG tablet Take 20 mg by mouth 3 (three) times daily.   aspirin  EC 81 MG tablet Take 1 tablet (81 mg total) by mouth daily. Swallow whole.   doxazosin  (CARDURA ) 1 MG tablet Take 1 tablet (1 mg total) by mouth 2 (two) times daily.   doxazosin  (CARDURA ) 2 MG tablet Take 1 tablet (2 mg total) by mouth 2 (two) times daily.   empagliflozin  (JARDIANCE ) 10 MG TABS tablet Take 1 tablet (10 mg total) by mouth daily before breakfast.   lamoTRIgine  (LAMICTAL ) 200 MG tablet Take 200 mg by mouth daily.   lovastatin  (MEVACOR ) 40 MG tablet Take 1 tablet (40 mg total) by mouth at bedtime.   lurasidone (LATUDA) 80 MG TABS tablet Take 80 mg by mouth daily.   QUEtiapine  Fumarate (SEROQUEL  XR) 150 MG 24 hr tablet Take 300 mg by mouth at bedtime.   No current facility-administered medications on file prior to visit.   Medications Discontinued During This Encounter  Medication Reason   losartan  (COZAAR ) 25 MG tablet      Physical Exam:    05/28/2023   10:56 AM 05/17/2023    4:09 PM 05/10/2023   12:58 PM  Vitals with BMI  Height 5\' 7"  5\' 7"  5\' 7"   Weight 166 lbs 3 oz 165 lbs 3 oz 168 lbs  BMI 26.02 25.87 26.31  Systolic 130 122 161  Diastolic 60 62 82  Pulse 70 100 68  Vital signs reviewed.  Nursing notes reviewed. Weight trend reviewed. Physical Exam General Appearance:  No acute distress appreciable.   Well-groomed, healthy-appearing male.  Well proportioned with no abnormal fat distribution.  Good muscle tone. Pulmonary:  Normal work of breathing at rest, no respiratory distress apparent. SpO2: 98 %  Musculoskeletal: All extremities are intact.  Neurological:  Awake, alert, oriented, and engaged.  No obvious focal neurological deficits or cognitive impairments.  Sensorium seems unclouded.   Speech is clear and coherent with logical  content. Psychiatric:  Appropriate mood, pleasant and cooperative demeanor, thoughtful and engaged during the exam Physical Exam       Results for orders placed or performed in visit on 05/28/23  Basic Metabolic Panel (BMET)  Result Value Ref Range   Sodium 136 135 - 145 mEq/L   Potassium 4.7 3.5 - 5.1 mEq/L   Chloride 103 96 - 112 mEq/L   CO2 25 19 - 32 mEq/L   Glucose, Bld 117 (H) 70 - 99 mg/dL   BUN 24 (H) 6 - 23 mg/dL   Creatinine, Ser 0.96 (H) 0.40 - 1.50 mg/dL   GFR 04.54 (L) >09.81 mL/min   Calcium  9.2 8.4 - 10.5 mg/dL  Urinalysis, Routine w reflex microscopic  Result Value Ref Range   Color, Urine YELLOW Yellow;Lt. Yellow;Straw;Dark Yellow;Amber;Green;Red;Brown   APPearance CLEAR Clear;Turbid;Slightly Cloudy;Cloudy   Specific Gravity, Urine 1.010 1.000 - 1.030   pH 6.0 5.0 - 8.0   Total Protein, Urine NEGATIVE Negative   Urine Glucose >=1000 (A) Negative   Ketones, ur NEGATIVE Negative   Bilirubin Urine NEGATIVE Negative   Hgb urine dipstick NEGATIVE Negative   Urobilinogen, UA 0.2 0.0 - 1.0   Leukocytes,Ua NEGATIVE Negative   Nitrite NEGATIVE Negative   WBC, UA 0-2/hpf 0-2/hpf   RBC / HPF 0-2/hpf 0-2/hpf  Office Visit on 05/28/2023  Component Date Value   Sodium 05/28/2023 136    Potassium 05/28/2023 4.7    Chloride 05/28/2023 103    CO2 05/28/2023 25    Glucose, Bld 05/28/2023 117 (H)    BUN 05/28/2023 24 (H)    Creatinine, Ser 05/28/2023 1.54 (H)    GFR 05/28/2023 46.77 (L)    Calcium  05/28/2023 9.2    Color, Urine 05/28/2023 YELLOW    APPearance 05/28/2023 CLEAR    Specific Gravity, Urine 05/28/2023 1.010    pH 05/28/2023 6.0    Total Protein, Urine 05/28/2023 NEGATIVE    Urine Glucose 05/28/2023 >=1000 (A)    Ketones, ur 05/28/2023 NEGATIVE    Bilirubin Urine 05/28/2023 NEGATIVE    Hgb urine dipstick 05/28/2023 NEGATIVE    Urobilinogen, UA 05/28/2023 0.2    Leukocytes,Ua 05/28/2023 NEGATIVE    Nitrite 05/28/2023 NEGATIVE    WBC, UA 05/28/2023  0-2/hpf    RBC / HPF 05/28/2023 0-2/hpf   Office Visit on 05/17/2023  Component Date Value   Sodium 05/17/2023 135    Potassium 05/17/2023 4.3    Chloride 05/17/2023 102    CO2 05/17/2023 22    Glucose, Bld 05/17/2023 96    BUN 05/17/2023 25 (H)    Creatinine, Ser 05/17/2023 1.43    GFR 05/17/2023 51.13 (L)    Calcium  05/17/2023 9.5    Microalb, Ur 05/17/2023 <0.7    Creatinine,U 05/17/2023 19.0    Microalb Creat Ratio 05/17/2023 Unable to calculate    CYSTATIN C 05/18/2023 1.44 (H)    eGFR 05/18/2023 47 (L)   Office Visit on 05/10/2023  Component Date Value   Sodium 05/10/2023 137    Potassium 05/10/2023 4.0    Chloride 05/10/2023 104    CO2 05/10/2023 25    Glucose, Bld 05/10/2023 96    BUN 05/10/2023 20    Creatinine, Ser 05/10/2023 1.18    GFR 05/10/2023 64.40    Calcium  05/10/2023 9.1    CYSTATIN C 05/10/2023 1.25 (H)    eGFR 05/10/2023 56 (L)    Microalb, Ur 05/10/2023 <0.7    Creatinine,U 05/10/2023 13.3    Microalb Creat Ratio 05/10/2023 Unable to calculate   Admission on 05/04/2023, Discharged on 05/05/2023  Component Date Value   Color, Urine 05/04/2023 COLORLESS (A)    APPearance 05/04/2023 CLEAR    Specific Gravity, Urine 05/04/2023 1.007    pH 05/04/2023 5.5    Glucose, UA 05/04/2023 500 (A)    Hgb urine dipstick 05/04/2023 NEGATIVE    Bilirubin Urine 05/04/2023 NEGATIVE    Ketones, ur 05/04/2023 NEGATIVE    Protein, ur 05/04/2023 NEGATIVE    Nitrite 05/04/2023 NEGATIVE    Leukocytes,Ua 05/04/2023 NEGATIVE    RBC / HPF 05/04/2023 0-5    WBC, UA 05/04/2023 0-5    Bacteria, UA 05/04/2023 NONE SEEN    Squamous Epithelial / HPF 05/04/2023 0-5    Sodium 05/04/2023 133 (L)    Potassium 05/04/2023 3.9    Chloride 05/04/2023 101    CO2 05/04/2023 22    Glucose, Bld 05/04/2023 100 (H)    BUN 05/04/2023 22    Creatinine, Ser 05/04/2023 1.97 (H)    Calcium  05/04/2023 9.2    GFR, Estimated 05/04/2023 37 (L)    Anion gap 05/04/2023 10    WBC 05/04/2023 7.1     RBC 05/04/2023 4.99    Hemoglobin 05/04/2023 14.7    HCT 05/04/2023 43.0    MCV 05/04/2023 86.2    MCH 05/04/2023 29.5  MCHC 05/04/2023 34.2    RDW 05/04/2023 13.0    Platelets 05/04/2023 173    nRBC 05/04/2023 0.0   Lab on 05/03/2023  Component Date Value   CYSTATIN C 05/03/2023 1.29 (H)    eGFR 05/03/2023 54 (L)    Sodium 05/03/2023 137    Potassium 05/03/2023 3.7    Chloride 05/03/2023 104    CO2 05/03/2023 24    Glucose, Bld 05/03/2023 101 (H)    BUN 05/03/2023 21    Creatinine, Ser 05/03/2023 1.41    GFR 05/03/2023 52.02 (L)    Calcium  05/03/2023 9.3    Microalb, Ur 05/03/2023 <0.7    Creatinine,U 05/03/2023 65.1    Microalb Creat Ratio 05/03/2023 Unable to calculate   Office Visit on 04/26/2023  Component Date Value   CYSTATIN C 04/26/2023 1.68 (H)    eGFR 04/26/2023 38 (L)    Creatinine, Urine 04/26/2023 58    Protein/Creat Ratio 04/26/2023 155 (H)    Protein/Creatinine Ratio 04/26/2023 0.155 (H)    Total Protein, Urine 04/26/2023 9    Sodium 04/26/2023 134 (L)    Potassium 04/26/2023 3.8    Chloride 04/26/2023 99    CO2 04/26/2023 26    Glucose, Bld 04/26/2023 108 (H)    BUN 04/26/2023 29 (H)    Creatinine, Ser 04/26/2023 1.76 (H)    GFR 04/26/2023 39.87 (L)    Calcium  04/26/2023 9.2   Lab on 04/23/2023  Component Date Value   Sodium 04/23/2023 139    Potassium 04/23/2023 3.5    Chloride 04/23/2023 101    CO2 04/23/2023 26    Glucose, Bld 04/23/2023 141 (H)    BUN 04/23/2023 28 (H)    Creatinine, Ser 04/23/2023 1.55 (H)    GFR 04/23/2023 46.44 (L)    Calcium  04/23/2023 9.4    CYSTATIN C 04/23/2023 1.52 (H)    eGFR 04/23/2023 44 (L)   Lab on 04/19/2023  Component Date Value   Glucose, Bld 04/19/2023 106 (H)    BUN 04/19/2023 31 (H)    Creat 04/19/2023 1.65 (H)    eGFR 04/19/2023 46 (L)    BUN/Creatinine Ratio 04/19/2023 19    Sodium 04/19/2023 132 (L)    Potassium 04/19/2023 3.5    Chloride 04/19/2023 99    CO2 04/19/2023 21    Calcium   04/19/2023 9.5    CYSTATIN C 04/19/2023 1.48 (H)    eGFR 04/19/2023 45 (L)   Office Visit on 04/09/2023  Component Date Value   Sodium 04/09/2023 138    Potassium 04/09/2023 4.4    Chloride 04/09/2023 104    CO2 04/09/2023 26    Glucose, Bld 04/09/2023 75    BUN 04/09/2023 24 (H)    Creatinine, Ser 04/09/2023 1.28    GFR 04/09/2023 58.44 (L)    Calcium  04/09/2023 9.5    Microalb, Ur 04/09/2023 <0.7    Creatinine,U 04/09/2023 84.9    Microalb Creat Ratio 04/09/2023 Unable to calculate    CYSTATIN C 04/09/2023 1.37 (H)    eGFR 04/09/2023 50 (L)    Color, Urine 04/09/2023 YELLOW    APPearance 04/09/2023 CLEAR    Specific Gravity, Urine 04/09/2023 1.017    pH 04/09/2023 6.0    Glucose, UA 04/09/2023 NEGATIVE    Bilirubin Urine 04/09/2023 NEGATIVE    Ketones, ur 04/09/2023 NEGATIVE    Hgb urine dipstick 04/09/2023 NEGATIVE    Protein, ur 04/09/2023 NEGATIVE    Nitrites, Initial 04/09/2023 NEGATIVE    Leukocyte Esterase 04/09/2023 NEGATIVE  WBC, UA 04/09/2023 NONE SEEN    RBC / HPF 04/09/2023 NONE SEEN    Squamous Epithelial / HPF 04/09/2023 NONE SEEN    Bacteria, UA 04/09/2023 NONE SEEN    Hyaline Cast 04/09/2023 NONE SEEN    Note 04/09/2023     Reflexve Urine Culture 04/09/2023    Office Visit on 03/26/2023  Component Date Value   WBC 03/26/2023 4.2    RBC 03/26/2023 5.10    Hemoglobin 03/26/2023 15.2    HCT 03/26/2023 46.0    MCV 03/26/2023 90.0    MCHC 03/26/2023 33.1    RDW 03/26/2023 14.3    Platelets 03/26/2023 144.0 (L)    Neutrophils Relative % 03/26/2023 49.8    Lymphocytes Relative 03/26/2023 26.9    Monocytes Relative 03/26/2023 14.6 (H)    Eosinophils Relative 03/26/2023 5.9 (H)    Basophils Relative 03/26/2023 2.8    Neutro Abs 03/26/2023 2.1    Lymphs Abs 03/26/2023 1.1    Monocytes Absolute 03/26/2023 0.6    Eosinophils Absolute 03/26/2023 0.3    Basophils Absolute 03/26/2023 0.1    Sodium 03/26/2023 139    Potassium 03/26/2023 4.4    Chloride  03/26/2023 106    CO2 03/26/2023 27    Glucose, Bld 03/26/2023 108 (H)    BUN 03/26/2023 22    Creatinine, Ser 03/26/2023 1.37    Total Bilirubin 03/26/2023 0.4    Alkaline Phosphatase 03/26/2023 100    AST 03/26/2023 18    ALT 03/26/2023 20    Total Protein 03/26/2023 6.5    Albumin 03/26/2023 4.2    GFR 03/26/2023 53.88 (L)    Calcium  03/26/2023 9.2    Cholesterol 03/26/2023 190    Triglycerides 03/26/2023 72.0    HDL 03/26/2023 51.70    VLDL 03/26/2023 14.4    LDL Cholesterol 03/26/2023 124 (H)    Total CHOL/HDL Ratio 03/26/2023 4    NonHDL 03/26/2023 138.36    PSA 03/26/2023 0.93    TSH 03/26/2023 0.98    Hgb A1c MFr Bld 03/26/2023 6.1   There may be more visits with results that are not included.  No image results found. CT Renal Stone Study Result Date: 05/05/2023 CLINICAL DATA:  Intense flank pain EXAM: CT ABDOMEN AND PELVIS WITHOUT CONTRAST TECHNIQUE: Multidetector CT imaging of the abdomen and pelvis was performed following the standard protocol without IV contrast. RADIATION DOSE REDUCTION: This exam was performed according to the departmental dose-optimization program which includes automated exposure control, adjustment of the mA and/or kV according to patient size and/or use of iterative reconstruction technique. COMPARISON:  Ultrasound 04/28/2023 FINDINGS: Lower chest: Lung bases demonstrate no acute airspace disease. Hepatobiliary: Gallstones. No biliary dilatation. No focal hepatic abnormality Pancreas: Unremarkable. No pancreatic ductal dilatation or surrounding inflammatory changes. Spleen: Normal in size without focal abnormality. Adrenals/Urinary Tract: Adrenal glands are within normal limits. No hydronephrosis. Multiple cysts. Punctate nonobstructing bilateral kidney stones. Bilateral renal cysts for which no imaging follow-up is recommended. The bladder is unremarkable. Stomach/Bowel: Stomach nonenlarged. No dilated small bowel. No acute bowel wall thickening. Moderate  to stool burden Vascular/Lymphatic: Aortic atherosclerosis. No enlarged abdominal or pelvic lymph nodes. Reproductive: Prostate is unremarkable. Other: Negative for pelvic effusion or free air Musculoskeletal: Scoliosis and degenerative changes of the spine. No acute osseous abnormality IMPRESSION: 1. Negative for hydronephrosis or hydroureter. Punctate nonobstructing bilateral kidney stones. 2. Gallstones. 3. Aortic atherosclerosis. Electronically Signed   By: Esmeralda Hedge M.D.   On: 05/05/2023 00:31   US  Renal Result Date: 04/28/2023  CLINICAL DATA:  Chronic kidney disease. EXAM: RENAL / URINARY TRACT ULTRASOUND COMPLETE COMPARISON:  None Available. FINDINGS: Right Kidney: Renal measurements: 10 x 6 x 5.2 cm = volume: 162.9 mL. Mild increased echotexture. No hydronephrosis visualized. Simple cysts are noted, largest measures 2.3 x 2.3 x 2.7 cm, no follow-up is recommended. Left Kidney: Renal measurements: 11 x 6.4 x 6.8 cm = volume: 251.8 mL. Increased echotexture. No hydronephrosis visualized. Simple cysts are noted, largest measures 4.2 x 4 x 4.4 cm, no follow-up is recommended. Bladder: Appears normal for degree of bladder distention. Other: None. IMPRESSION: 1. No acute abnormality identified. 2. Increased echotexture of bilateral kidneys, consistent with medical renal disease. Electronically Signed   By: Anna Barnes M.D.   On: 04/28/2023 09:41   CT CARDIAC SCORING (SELF PAY ONLY) Addendum Date: 04/22/2023 ADDENDUM REPORT: 04/22/2023 03:46 EXAM: OVER-READ INTERPRETATION  CT CHEST The following report is an over-read performed by radiologist Dr. Violeta Grey of [facility] on 04/22/2023. This over-read does not include interpretation of cardiac or coronary anatomy or pathology. The coronary calcium  score interpretation by the cardiologist is attached. COMPARISON:  None. FINDINGS: Cardiovascular: Scattered atherosclerotic calcifications of the aorta are noted. No aneurysmal dilatation is seen.  Mediastinum/Nodes: There are no enlarged lymph nodes within the visualized mediastinum. Lungs/Pleura: There is no pleural effusion. The visualized lungs appear clear. Upper abdomen: No significant findings in the visualized upper abdomen. Musculoskeletal/Chest wall: No chest wall mass or suspicious osseous findings within the visualized chest. IMPRESSION: Aortic Atherosclerosis (ICD10-I70.0). Electronically Signed   By: Violeta Grey M.D.   On: 04/22/2023 03:46   Result Date: 04/22/2023 CLINICAL DATA:  cardiovascular disease risk stratification EXAM: Coronary Calcium  Score TECHNIQUE: A gated, non-contrast computed tomography scan of the heart was performed using 3mm slice thickness. Axial images were analyzed on a dedicated workstation. Calcium  scoring of the coronary arteries was performed using the Agatston method. FINDINGS: Coronary Calcium  Score: Left main: 53.6 Left anterior descending artery: 55.5 Left circumflex artery: 0 Right coronary artery: 0 Total: 109 Percentile: 54th Aortic atherosclerosis. Pericardium: Normal. Non-cardiac: See separate report from [facility]. IMPRESSION: 1. Coronary calcium  score of 109. This was 54th percentile for age-, race-, and sex-matched controls. RECOMMENDATIONS: Coronary artery calcium  (CAC) score is a strong predictor of incident coronary heart disease (CHD) and provides predictive information beyond traditional risk factors. CAC scoring is reasonable to use in the decision to withhold, postpone, or initiate statin therapy in intermediate-risk or selected borderline-risk asymptomatic adults (age 54-75 years and LDL-C >=70 to <190 mg/dL) who do not have diabetes or established atherosclerotic cardiovascular disease (ASCVD).* In intermediate-risk (10-year ASCVD risk >=7.5% to <20%) adults or selected borderline-risk (10-year ASCVD risk >=5% to <7.5%) adults in whom a CAC score is measured for the purpose of making a treatment decision the following recommendations  have been made: If CAC=0, it is reasonable to withhold statin therapy and reassess in 5 to 10 years, as long as higher risk conditions are absent (diabetes mellitus, family history of premature CHD in first degree relatives (males <55 years; females <65 years), cigarette smoking, or LDL >=190 mg/dL). If CAC is 1 to 99, it is reasonable to initiate statin therapy for patients >=15 years of age. If CAC is >=100 or >=75th percentile, it is reasonable to initiate statin therapy at any age. Cardiology referral should be considered for patients with CAC scores >=400 or >=75th percentile. *2018 AHA/ACC/AACVPR/AAPA/ABC/ACPM/ADA/AGS/APhA/ASPC/NLA/PCNA Guideline on the Management of Blood Cholesterol: A Report of the [employer] of  Cardiology/American Heart Association Task Force on Clinical Practice Guidelines. J Am Coll Cardiol. 2019;73(24):3168-3209. Maudine Sos, MD Electronically Signed: By: Maudine Sos M.D. On: 04/07/2023 16:28      Results LABS Cystatin C: 47 Creatinine: 1.44 EGFR: 56  DIAGNOSTIC EKG: Normal    Assessment & Plan Acute kidney injury (HCC)  Primary hypertension Blood pressure has improved with losartan , averaging mid-130s/70s, but remains borderline controlled with a goal of 130/80 mmHg. Losartan  is effective without side effects. Further adjustment of losartan  is considered to achieve blood pressure goals, with caution to avoid potential kidney damage. Increasing losartan  depends on GFR results, following guidelines to hold losartan  if GFR drops by more than 30%. Recheck blood pressure and kidney function tests, including creatinine and cystatin C. Consider increasing losartan  to 37.5 mg twice daily if GFR is 45 or better. Evaluate pharmacy response to twice daily dosing of losartan . Consider alternative medication if pharmacy does not approve twice daily dosing. Monitor for potential side effects of increased losartan  dosage. Chronic kidney disease (CKD), active medical  management without dialysis, stage 3 (moderate) (HCC) CKD stage 3a with fluctuating GFR readings. Recent GFR drop from 56 to 47 is not indicative of true kidney damage. Losartan  has not caused kidney damage and may have improved kidney function by reducing proteinuria. No significant proteinuria detected, indicating no ongoing kidney damage. Jardiance  is used to protect kidneys and prevent progression of CKD. He is informed about the potential benefits and risks of losartan  and Jardiance , including the risk of urinary tract infections with Jardiance . The decision to continue losartan  is based on its potential to improve kidney function despite fluctuations in GFR. Continue losartan  at current dose, with potential increase based on GFR results. Increase Jardiance  to 25 mg daily for kidney protection. Perform urinalysis to check for proteinuria and urinary tract infections. Monitor kidney function with regular blood tests, including cystatin C and creatinine. Educate on the importance of maintaining blood pressure control to prevent further kidney damage.   If gfr is 45 or better will go to losartan  37.5 twice daily.      Orders Placed During this Encounter:   Orders Placed This Encounter  Procedures   Basic Metabolic Panel (BMET)   Cystatin C with Glomerular Filtration Rate, Estimated (eGFR)   Microalbumin / creatinine urine ratio    Standing Status:   Future    Expiration Date:   05/27/2024   Urinalysis, Routine w reflex microscopic   Meds ordered this encounter  Medications   empagliflozin  (JARDIANCE ) 25 MG TABS tablet    Sig: Take 1 tablet (25 mg total) by mouth daily before breakfast.    Dispense:  90 tablet    Refill:  3   losartan  (COZAAR ) 25 MG tablet    Sig: Take 1.5 tablets (37.5 mg total) by mouth 2 (two) times daily.    Dispense:  270 tablet    Refill:  3   ED Discharge Orders          Ordered    Basic Metabolic Panel (BMET)        05/28/23 1131    Cystatin C with Glomerular  Filtration Rate, Estimated (eGFR)        05/28/23 1131    Microalbumin / creatinine urine ratio        05/28/23 1131    empagliflozin  (JARDIANCE ) 25 MG TABS tablet  Daily before breakfast        05/28/23 1131    Urinalysis, Routine w reflex microscopic  05/28/23 1131    losartan  (COZAAR ) 25 MG tablet  2 times daily        05/28/23 1131              This document was synthesized by artificial intelligence ([employer]bridge) using HIPAA-compliant recording of the clinical interaction;   We discussed the use of AI scribe software for clinical note transcription with the patient, who gave verbal consent to proceed. additional Info: This encounter employed state-of-the-art, real-time, collaborative documentation. The patient actively reviewed and assisted in updating their electronic medical record on a shared screen, ensuring transparency and facilitating joint problem-solving for the problem list, overview, and plan. This approach promotes accurate, informed care. The treatment plan was discussed and reviewed in detail, including medication safety, potential side effects, and all patient questions. We confirmed understanding and comfort with the plan. Follow-up instructions were established, including contacting the office for any concerns, returning if symptoms worsen, persist, or new symptoms develop, and precautions for potential emergency department visits.

## 2023-05-28 NOTE — Patient Instructions (Signed)
 VISIT SUMMARY:  You came in today for a follow-up on your blood pressure management and kidney function. Your blood pressure has improved with the current dose of losartan , and you have not experienced any side effects. However, your kidney function has shown some fluctuations, which we are monitoring closely. We discussed potential adjustments to your medications and the importance of maintaining a healthy diet and lifestyle.  YOUR PLAN:  -HYPERTENSION: Hypertension, or high blood pressure, is when the force of your blood against your artery walls is too high. Your blood pressure has improved with losartan , but it is still slightly above the target. We may consider increasing your losartan  dose to 37.5 mg twice daily if your kidney function allows. We will recheck your blood pressure and kidney function tests, including creatinine and cystatin C, to decide on the next steps. If the pharmacy does not approve the twice-daily dosing, we may consider an alternative medication. Please monitor for any potential side effects from the increased dosage.  -CHRONIC KIDNEY DISEASE, STAGE 3A: Chronic kidney disease (CKD) stage 3a means your kidneys are moderately damaged and not working as well as they should. Your recent GFR drop is not indicative of true kidney damage, and losartan  has not caused harm. Jardiance  is being used to protect your kidneys and prevent further progression of CKD. We will continue your current dose of losartan  and may increase it based on your GFR results. We will also increase Jardiance  to 25 mg daily. Regular blood tests, including cystatin C and creatinine, will be performed to monitor your kidney function. A urinalysis will be done to check for protein in your urine and any urinary tract infections. Maintaining good blood pressure control is crucial to prevent further kidney damage.  INSTRUCTIONS:  Please follow up with blood pressure and kidney function tests, including creatinine and  cystatin C. We will consider increasing your losartan  dose based on these results. Also, increase your Jardiance  to 25 mg daily. Continue to monitor your blood pressure at home and report any side effects. Maintain a healthy diet and lifestyle to support your overall health. Holding or discontinuing losartan  when glomerular filtration rate (GFR) increases by more than 30%--even in the absence of microalbuminuria--is a precautionary measure rooted in clinical guidelines and pharmacologic considerations. Why Hold Losartan  with a >30% GFR Increase? Potential Hemodynamic Effects: Angiotensin II receptor blockers (ARBs) like losartan  can cause vasodilation of the efferent arterioles in the kidney. While this effect can be beneficial in certain conditions, it may lead to a transient reduction in GFR, especially if blood pressure drops or if there's underlying renal artery stenosis. An abrupt increase in GFR by more than 30% may indicate a hemodynamic shift that warrants careful monitoring . Guideline Recommendations: The [employer]'s Kidney Disease Outcomes Quality Initiative (KDOQI) guidelines suggest that if GFR decreases by more than 30% from baseline within 4 weeks after initiating or increasing the dose of an ACE inhibitor or ARB, the dose may need to be reduced, and GFR should be reassessed frequently until kidney function has returned to baseline .([employer]) Lack of Microalbuminuria: Microalbuminuria is a common indicator of early kidney damage. In its absence, the benefits of ARB therapy may be less pronounced, and the risks associated with potential hemodynamic changes become more significant. Therefore, in patients without microalbuminuria, clinicians may opt to hold or adjust the medication to avoid unnecessary complications.([employer]) Is Losartan  Damaging the Kidney? Not necessarily. The observed changes in GFR are often reversible and may reflect the  kidney's  adaptation to altered hemodynamics. In fact, in certain populations, ARBs have been shown to have renoprotective effects. For instance, the RENAAL trial demonstrated that losartan  reduced albuminuria and slowed the progression of kidney disease in patients with type 2 diabetes and nephropathy .([facility]) However, the response to ARBs can vary among individuals. In some cases, especially without the presence of microalbuminuria, the potential benefits may not outweigh the risks, leading clinicians to exercise caution. Conclusion Holding losartan  when GFR increases by more than 30% is a precautionary approach to ensure patient safety. It's not necessarily an indication that the medication is damaging the kidneys, but rather a measure to monitor and assess the kidney's response to the medication. Always consult with a healthcare provider before making any changes to medication regimens.

## 2023-05-29 NOTE — Assessment & Plan Note (Signed)
 CKD stage 3a with fluctuating GFR readings. Recent GFR drop from 56 to 47 is not indicative of true kidney damage. Losartan  has not caused kidney damage and may have improved kidney function by reducing proteinuria. No significant proteinuria detected, indicating no ongoing kidney damage. Jardiance  is used to protect kidneys and prevent progression of CKD. He is informed about the potential benefits and risks of losartan  and Jardiance , including the risk of urinary tract infections with Jardiance . The decision to continue losartan  is based on its potential to improve kidney function despite fluctuations in GFR. Continue losartan  at current dose, with potential increase based on GFR results. Increase Jardiance  to 25 mg daily for kidney protection. Perform urinalysis to check for proteinuria and urinary tract infections. Monitor kidney function with regular blood tests, including cystatin C and creatinine. Educate on the importance of maintaining blood pressure control to prevent further kidney damage.

## 2023-05-29 NOTE — Assessment & Plan Note (Signed)
 Blood pressure has improved with losartan , averaging mid-130s/70s, but remains borderline controlled with a goal of 130/80 mmHg. Losartan  is effective without side effects. Further adjustment of losartan  is considered to achieve blood pressure goals, with caution to avoid potential kidney damage. Increasing losartan  depends on GFR results, following guidelines to hold losartan  if GFR drops by more than 30%. Recheck blood pressure and kidney function tests, including creatinine and cystatin C. Consider increasing losartan  to 37.5 mg twice daily if GFR is 45 or better. Evaluate pharmacy response to twice daily dosing of losartan . Consider alternative medication if pharmacy does not approve twice daily dosing. Monitor for potential side effects of increased losartan  dosage.

## 2023-05-30 ENCOUNTER — Encounter: Payer: Self-pay | Admitting: Internal Medicine

## 2023-05-30 DIAGNOSIS — N200 Calculus of kidney: Secondary | ICD-10-CM

## 2023-05-30 DIAGNOSIS — R7303 Prediabetes: Secondary | ICD-10-CM

## 2023-05-30 DIAGNOSIS — N183 Chronic kidney disease, stage 3 unspecified: Secondary | ICD-10-CM

## 2023-05-30 DIAGNOSIS — S37009D Unspecified injury of unspecified kidney, subsequent encounter: Secondary | ICD-10-CM

## 2023-05-30 NOTE — Progress Notes (Signed)
 GFR right above 45 which is where I said was the cutoff that you could go up on the losartan  but I think we should wait a little longer and see with the Cystatin C comes back yeah so just keep taking the 25 mg twice a day for now on the losartan  but you are good to go ahead and go up on the Jardiance  to the 25 mg dosing.  There is no protein in the urine so I do not think there is any renal damage but it is still drifting just a little bit towards a lower filtration rate coming close to that 30% rule

## 2023-05-31 LAB — CYSTATIN C WITH GLOMERULAR FILTRATION RATE, ESTIMATED (EGFR)
CYSTATIN C: 1.56 mg/L — ABNORMAL HIGH (ref 0.52–1.20)
eGFR: 42 mL/min/{1.73_m2} — ABNORMAL LOW (ref >=60)

## 2023-05-31 NOTE — Telephone Encounter (Signed)
 read by Landry Pinks at 12:34PM on 05/30/2023

## 2023-05-31 NOTE — Progress Notes (Signed)
 Now we have the more reliable cystatin C which does show the gfr (rate of filtration) is down to 42 from all time high of 56 corresponding to a 25% reduction in the filtration rate.   This is most consistent with a real effect of losartan  on your rate of filtration, but no actual kidney damage from the protein testing.  Nonetheless, I will respect the 30% reduction rule we discussed and stay with the plan of only 25 mg twice daily losartan  for now... probably long term.   This means we likely have to add another type of blood pressure medication(s) if your blood pressure stays in 140 range- let me know.

## 2023-06-07 ENCOUNTER — Ambulatory Visit (INDEPENDENT_AMBULATORY_CARE_PROVIDER_SITE_OTHER): Admitting: Internal Medicine

## 2023-06-07 ENCOUNTER — Other Ambulatory Visit: Payer: Self-pay | Admitting: Internal Medicine

## 2023-06-07 ENCOUNTER — Encounter: Payer: Self-pay | Admitting: Internal Medicine

## 2023-06-07 VITALS — BP 134/68 | HR 56 | Ht 67.0 in | Wt 169.0 lb

## 2023-06-07 DIAGNOSIS — N182 Chronic kidney disease, stage 2 (mild): Secondary | ICD-10-CM | POA: Diagnosis not present

## 2023-06-07 DIAGNOSIS — N179 Acute kidney failure, unspecified: Secondary | ICD-10-CM

## 2023-06-07 DIAGNOSIS — I1A0 Resistant hypertension: Secondary | ICD-10-CM

## 2023-06-07 DIAGNOSIS — E785 Hyperlipidemia, unspecified: Secondary | ICD-10-CM

## 2023-06-07 MED ORDER — HYDROCHLOROTHIAZIDE 25 MG PO TABS: 25.0000 mg | ORAL_TABLET | Freq: Every day | ORAL | 3 refills | Status: DC

## 2023-06-07 NOTE — Progress Notes (Unsigned)
 ==============================  [facility] [facility]: [phone]   -- Medical Office Visit --  Patient: Eric Crane      Age: 68 y.o.       Sex:  male  Date:   06/07/2023 Today's Healthcare Provider: Anthon Kins, MD  ==============================   Chief Complaint: Hypertension (Patient says he has been ranging between 120-140's for the top number and ranging between 60-80's for the bottom number. ) and Chronic Kidney Disease   Discussed the use of AI scribe software for clinical note transcription with the patient, who gave verbal consent to proceed.  History of Present Illness Eric Crane is a 68 year old male with hypertension and chronic kidney disease who presents with uncontrolled blood pressure.  His blood pressure has been erratic, with readings ranging from 120 to 150 mmHg, and remains high despite medication adjustments. He has difficulty maintaining a consistent medication schedule, which may contribute to the variability in his blood pressure.  He has been on losartan  25 mg twice daily since May 10, 2023, after a previous increase to 100 mg daily resulted in a significant drop in his GFR to 39. The losartan  was held until his GFR improved to 64, and then it was restarted at the lower dose. He also takes doxazosin  2 mg twice daily and has been on this regimen for about a month and a half. His kidney function worsened when the losartan  dose was increased, leading to the current dosing strategy.  He has been on hydrochlorothiazide  in the past, but it was stopped due to a drop in kidney function. He is currently taking lovastatin  for cholesterol management and tolerates it well without any impact on his physical activities, such as playing hockey.  He is concerned about his kidney function, noting that recent lab results showed a 25% reduction in cystatin C and creatinine levels.  T {{Next Visit Focus:  Review creatinine trend and  response to medication changes Assess BP control with new regimen Evaluate renal ultrasound results Review nutrition consult recommendations Discuss SGLT2i initiation if AKI resolved  HM Measures:  GFR trending (baseline 53.88 to current 39.87) BP target <140/90 during adjustment UACR monitoring Cardiovascular risk assessment (CAC score 109)  Additional Tests:  BMP in 3-5 days Renal ultrasound pending UACR monitoring Cystatin C trending  Care Gaps:  SGLT2i initiation pending Nutrition consultation needed Home BP monitoring optimization Statin therapy consideration (LDL 124)  Follow-up Items:  Review BMP within 5 days Check renal ultrasound results Monitor home BP logs Assess for medication side effects Track nutrition consult completion  Up-to-Date Guidelines:  KDIGO 2023: Hold RAAS inhibitors if creatinine increases >30% from baseline ACC/AHA 2017: Target BP <130/80 in CKD with proteinuria Consider beta-blocker if evening BP remains elevated  Personal Reminders:  Patient takes Adderall - may affect BP pattern Previous statin intolerance limits options Evening BP pattern needs attention Consider chronotherapy approach:1}{ Problem List as of 06/07/2023 Reviewed: 05/29/2023  9:48 PM by Anthon Kins, MD    Weight disorder   Thrombocytopenia (HCC)   Status post total right knee replacement   Statin intolerance   Screening PSA (prostate specific antigen)   Primary osteoarthritis of right knee   Prediabetes   Last Assessment & Plan 03/26/2023 Office Visit Written 03/27/2023  5:32 PM by Anthon Kins, MD  A slight increase in abdominal fat was noted. Check A1c levels with an A1c test.      Pain in left hip   Osteoarthritis of  lower back   Last Assessment & Plan 11/20/2021 Office Visit Written 11/20/2021  9:14 AM by Anthon Kins, MD  Stable, not bothering Recommended limit contact sports (ice hockey)      Nephrolithiasis   Last Assessment & Plan  05/10/2023 Office Visit Written 05/10/2023  8:23 PM by Anthon Kins, MD  Nephrolithiasis, Bilateral (N20.0)  Assessment: CT 05/05/2023 confirmed "punctate nonobstructing bilateral kidney stones." No current symptoms of renal colic. History of dehydration, particularly when not actively engaged in physical activity. No hydronephrosis present. Patient plays hockey and reports adequate hydration during activity but less consistent hydration during daily activities.  Plan: Increase fluid intake to >2.5L daily, maintain consistent hydration throughout the day Dietary modifications:  Low sodium diet (<2g daily) Moderate protein intake Adequate calcium  from dietary sources (avoids supplements) Strain urine if experiencing symptoms of stone passage Preventive education provided about stone risk factors see AVS. Follow-up imaging in 6 months to assess stone burden        Injury to kidney   Last Assessment & Plan 05/10/2023 Office Visit Written 05/10/2023  8:23 PM by Anthon Kins, MD    Acute Kidney Injury superimposed on CKD Stage 3a (N17.9, N18.3)  Assessment: Patient with known CKD stage 3a experienced AKI with creatinine rising from 1.28 to 1.76 (37% increase) after losartan  dose increase. GFR declined from 58 to 40, now improved to 52 after losartan  discontinuation. Cystatin C similarly improved from 1.68 to 1.29. Critical Labs: Most recent creatinine 1.41 mg/dL (improved from peak of 1.97 during hospitalization). Hospital admission on 4/8 showed transient worsening with creatinine 1.97 and significant glucosuria (500 mg/dL). Renal ultrasound showed bilateral renal cysts and increased echotexture consistent with medical renal disease. CT demonstrated non-obstructing bilateral kidney stones.  Plan: Restart losartan  at reduced dose (25 mg BID) with careful monitoring of renal function Avoid potentially nephrotoxic medications including NSAIDs Monitor electrolytes, renal function at 1 week then  monthly Maintain adequate hydration, especially during physical activity Nephrology referral for comprehensive CKD management Monitor urine microalbumin for proteinuria assessment        Hypertension   Last Assessment & Plan 05/28/2023 Office Visit Written 05/29/2023  9:51 PM by Anthon Kins, MD  Blood pressure has improved with losartan , averaging mid-130s/70s, but remains borderline controlled with a goal of 130/80 mmHg. Losartan  is effective without side effects. Further adjustment of losartan  is considered to achieve blood pressure goals, with caution to avoid potential kidney damage. Increasing losartan  depends on GFR results, following guidelines to hold losartan  if GFR drops by more than 30%. Recheck blood pressure and kidney function tests, including creatinine and cystatin C. Consider increasing losartan  to 37.5 mg twice daily if GFR is 45 or better. Evaluate pharmacy response to twice daily dosing of losartan . Consider alternative medication if pharmacy does not approve twice daily dosing. Monitor for potential side effects of increased losartan  dosage.      Hyperlipidemia, acquired   Last Assessment & Plan 05/17/2023 Office Visit Written 05/18/2023  7:19 AM by Anthon Kins, MD  Hyperlipidemia is managed with lovastatin . LDL levels have improved but remain elevated, and he experiences muscle soreness with stronger statins. Triglyceride levels are excellent, and HDL levels are improving. Discussed dietary modifications and potential fish oil supplementation if covered by insurance. Continue lovastatin  therapy. Implement dietary modifications to include extra virgin olive oil, avocados, and omega-3 fatty acids. Engage in regular physical activity. Consider fish oil supplementation if insurance covers it.      H/O total knee  replacement   Gallstones   Last Assessment & Plan 05/10/2023 Office Visit Written 05/10/2023  8:23 PM by Anthon Kins, MD  Cholelithiasis, Asymptomatic (K80.20)   Assessment: Incidental finding of gallstones on CT scan 05/05/2023. Patient reports no symptoms of biliary colic, no right upper quadrant pain, no nausea/vomiting after fatty meals, no jaundice. No biliary ductal dilation noted on imaging.  Plan: Expectant management - no intervention needed for asymptomatic gallstones Dietary counseling on low-fat diet to reduce symptoms Patient education on warning signs of acute cholecystitis or biliary obstruction No specific follow-up imaging required for asymptomatic gallstones        FH: heart disease   Elevated serum creatinine   Coronary artery calcification   Last Assessment & Plan 05/17/2023 Office Visit Written 05/18/2023  7:19 AM by Anthon Kins, MD  He has coronary artery disease with plaque in the left main artery, posing a risk for sudden cardiac events. Current management includes aspirin  therapy. A cardiology consult is scheduled for further evaluation. Discussed the risk of sudden cardiac death if the plaque ruptures and the importance of aspirin  therapy. Consider a portable EKG device for home monitoring. Continue aspirin  therapy and attend the cardiology consult on June 4th for further evaluation and potential stress test or echocardiogram. Consider purchasing a portable EKG device (Cardia) for home heart rhythm monitoring.      Chronic kidney disease (CKD), active medical management without dialysis, stage 3 (moderate) [facility])   Last Assessment & Plan 05/28/2023 Office Visit Written 05/29/2023  9:51 PM by Anthon Kins, MD  CKD stage 3a with fluctuating GFR readings. Recent GFR drop from 56 to 47 is not indicative of true kidney damage. Losartan  has not caused kidney damage and may have improved kidney function by reducing proteinuria. No significant proteinuria detected, indicating no ongoing kidney damage. Jardiance  is used to protect kidneys and prevent progression of CKD. He is informed about the potential benefits and risks of losartan  and  Jardiance , including the risk of urinary tract infections with Jardiance . The decision to continue losartan  is based on its potential to improve kidney function despite fluctuations in GFR. Continue losartan  at current dose, with potential increase based on GFR results. Increase Jardiance  to 25 mg daily for kidney protection. Perform urinalysis to check for proteinuria and urinary tract infections. Monitor kidney function with regular blood tests, including cystatin C and creatinine. Educate on the importance of maintaining blood pressure control to prevent further kidney damage.      Bipolar 2 disorder [facility])   Last Assessment & Plan 11/20/2021 Office Visit Written 11/20/2021  9:33 AM by Anthon Kins, MD  Following with Dr. Gavin Kast       Attention deficit hyperactivity disorder (ADHD)  :1}}  Updated Problem List Entries: No problems updated.  Background Reviewed: Problem List: has Primary osteoarthritis of right knee; Weight disorder; FH: heart disease; Osteoarthritis of lower back; Hyperlipidemia, acquired; Thrombocytopenia (HCC); Attention deficit hyperactivity disorder (ADHD); Bipolar 2 disorder (HCC); H/O total knee replacement; Prediabetes; Statin intolerance; Status post total right knee replacement; Pain in left hip; Screening PSA (prostate specific antigen); Chronic kidney disease (CKD), active medical management without dialysis, stage 3 (moderate) (HCC); Hypertension; Coronary artery calcification; Elevated serum creatinine; Nephrolithiasis; Gallstones; and Injury to kidney on their problem list. Past Medical History:  has a past medical history of Bipolar 2 disorder (HCC) (11/20/2021), Depression, FH: heart disease (11/20/2021), Osteoarthritis of lower back (11/20/2021), Overweight (11/20/2021), and Thrombocytopenia (HCC) (11/20/2021). Past Surgical History:   has a  past surgical history that includes Colonoscopy with propofol  (N/A, 02/18/2016); Tonsillectomy; and Total knee  arthroplasty (Right, 02/02/2022). Social History:   reports that he has never smoked. He has never used smokeless tobacco. He reports that he does not drink alcohol and does not use drugs. Family History:  family history includes Heart disease in his mother. Allergies:  has no known allergies.   Medication Reconciliation: Current Outpatient Medications on File Prior to Visit  Medication Sig   albuterol  (VENTOLIN  HFA) 108 (90 Base) MCG/ACT inhaler Inhale 2 puffs into the lungs every 6 (six) hours as needed for wheezing or shortness of breath.   amLODipine  (NORVASC ) 5 MG tablet Take 1 tablet (5 mg total) by mouth in the morning and at bedtime.   amphetamine-dextroamphetamine (ADDERALL) 20 MG tablet Take 20 mg by mouth 3 (three) times daily.   aspirin  EC 81 MG tablet Take 1 tablet (81 mg total) by mouth daily. Swallow whole.   doxazosin  (CARDURA ) 1 MG tablet Take 1 tablet (1 mg total) by mouth 2 (two) times daily.   doxazosin  (CARDURA ) 2 MG tablet Take 1 tablet (2 mg total) by mouth 2 (two) times daily.   empagliflozin  (JARDIANCE ) 25 MG TABS tablet Take 1 tablet (25 mg total) by mouth daily before breakfast.   lamoTRIgine  (LAMICTAL ) 200 MG tablet Take 200 mg by mouth daily.   losartan  (COZAAR ) 25 MG tablet Take 1.5 tablets (37.5 mg total) by mouth 2 (two) times daily.   lurasidone (LATUDA) 80 MG TABS tablet Take 80 mg by mouth daily.   QUEtiapine  Fumarate (SEROQUEL  XR) 150 MG 24 hr tablet Take 300 mg by mouth at bedtime.   No current facility-administered medications on file prior to visit.   Medications Discontinued During This Encounter  Medication Reason   empagliflozin  (JARDIANCE ) 10 MG TABS tablet Patient Preference     Physical Exam:    06/07/2023    3:27 PM 05/28/2023   10:56 AM 05/17/2023    4:09 PM  Vitals with BMI  Height 5\' 7"  5\' 7"  5\' 7"   Weight 169 lbs 166 lbs 3 oz 165 lbs 3 oz  BMI 26.46 26.02 25.87  Systolic 134 130 782  Diastolic 68 60 62  Pulse 56 70 100  Vital signs  reviewed.  Nursing notes reviewed. Weight trend reviewed. Physical Exam General Appearance:  No acute distress appreciable.   Well-groomed, healthy-appearing male.  Well proportioned with no abnormal fat distribution.  Good muscle tone. Pulmonary:  Normal work of breathing at rest, no respiratory distress apparent. SpO2: 96 %  Musculoskeletal: All extremities are intact.  Neurological:  Awake, alert, oriented, and engaged.  No obvious focal neurological deficits or cognitive impairments.  Sensorium seems unclouded.   Speech is clear and coherent with logical content. Psychiatric:  Appropriate mood, pleasant and cooperative demeanor, thoughtful and engaged during the exam Physical Exam  ***   {Insert previous labs (optional):[id number]} {See past labs  Heme  Chem  Endocrine  Serology  Results Review (optional):1} No results found for any visits on 06/07/23. Office Visit on 05/28/2023  Component Date Value   Sodium 05/28/2023 136    Potassium 05/28/2023 4.7    Chloride 05/28/2023 103    CO2 05/28/2023 25    Glucose, Bld 05/28/2023 117 (H)    BUN 05/28/2023 24 (H)    Creatinine, Ser 05/28/2023 1.54 (H)    GFR 05/28/2023 46.77 (L)    Calcium  05/28/2023 9.2    CYSTATIN C 05/28/2023 1.56 (H)    eGFR  05/28/2023 42 (L)    Color, Urine 05/28/2023 YELLOW    APPearance 05/28/2023 CLEAR    Specific Gravity, Urine 05/28/2023 1.010    pH 05/28/2023 6.0    Total Protein, Urine 05/28/2023 NEGATIVE    Urine Glucose 05/28/2023 >=1000 (A)    Ketones, ur 05/28/2023 NEGATIVE    Bilirubin Urine 05/28/2023 NEGATIVE    Hgb urine dipstick 05/28/2023 NEGATIVE    Urobilinogen, UA 05/28/2023 0.2    Leukocytes,Ua 05/28/2023 NEGATIVE    Nitrite 05/28/2023 NEGATIVE    WBC, UA 05/28/2023 0-2/hpf    RBC / HPF 05/28/2023 0-2/hpf   Office Visit on 05/17/2023  Component Date Value   Sodium 05/17/2023 135    Potassium 05/17/2023 4.3    Chloride 05/17/2023 102    CO2 05/17/2023 22    Glucose, Bld  05/17/2023 96    BUN 05/17/2023 25 (H)    Creatinine, Ser 05/17/2023 1.43    GFR 05/17/2023 51.13 (L)    Calcium  05/17/2023 9.5    Microalb, Ur 05/17/2023 <0.7    Creatinine,U 05/17/2023 19.0    Microalb Creat Ratio 05/17/2023 Unable to calculate    CYSTATIN C 05/18/2023 1.44 (H)    eGFR 05/18/2023 47 (L)   Office Visit on 05/10/2023  Component Date Value   Sodium 05/10/2023 137    Potassium 05/10/2023 4.0    Chloride 05/10/2023 104    CO2 05/10/2023 25    Glucose, Bld 05/10/2023 96    BUN 05/10/2023 20    Creatinine, Ser 05/10/2023 1.18    GFR 05/10/2023 64.40    Calcium  05/10/2023 9.1    CYSTATIN C 05/10/2023 1.25 (H)    eGFR 05/10/2023 56 (L)    Microalb, Ur 05/10/2023 <0.7    Creatinine,U 05/10/2023 13.3    Microalb Creat Ratio 05/10/2023 Unable to calculate   Admission on 05/04/2023, Discharged on 05/05/2023  Component Date Value   Color, Urine 05/04/2023 COLORLESS (A)    APPearance 05/04/2023 CLEAR    Specific Gravity, Urine 05/04/2023 1.007    pH 05/04/2023 5.5    Glucose, UA 05/04/2023 500 (A)    Hgb urine dipstick 05/04/2023 NEGATIVE    Bilirubin Urine 05/04/2023 NEGATIVE    Ketones, ur 05/04/2023 NEGATIVE    Protein, ur 05/04/2023 NEGATIVE    Nitrite 05/04/2023 NEGATIVE    Leukocytes,Ua 05/04/2023 NEGATIVE    RBC / HPF 05/04/2023 0-5    WBC, UA 05/04/2023 0-5    Bacteria, UA 05/04/2023 NONE SEEN    Squamous Epithelial / HPF 05/04/2023 0-5    Sodium 05/04/2023 133 (L)    Potassium 05/04/2023 3.9    Chloride 05/04/2023 101    CO2 05/04/2023 22    Glucose, Bld 05/04/2023 100 (H)    BUN 05/04/2023 22    Creatinine, Ser 05/04/2023 1.97 (H)    Calcium  05/04/2023 9.2    GFR, Estimated 05/04/2023 37 (L)    Anion gap 05/04/2023 10    WBC 05/04/2023 7.1    RBC 05/04/2023 4.99    Hemoglobin 05/04/2023 14.7    HCT 05/04/2023 43.0    MCV 05/04/2023 86.2    MCH 05/04/2023 29.5    MCHC 05/04/2023 34.2    RDW 05/04/2023 13.0    Platelets 05/04/2023 173    nRBC  05/04/2023 0.0   Lab on 05/03/2023  Component Date Value   CYSTATIN C 05/03/2023 1.29 (H)    eGFR 05/03/2023 54 (L)    Sodium 05/03/2023 137    Potassium 05/03/2023 3.7    Chloride 05/03/2023  104    CO2 05/03/2023 24    Glucose, Bld 05/03/2023 101 (H)    BUN 05/03/2023 21    Creatinine, Ser 05/03/2023 1.41    GFR 05/03/2023 52.02 (L)    Calcium  05/03/2023 9.3    Microalb, Ur 05/03/2023 <0.7    Creatinine,U 05/03/2023 65.1    Microalb Creat Ratio 05/03/2023 Unable to calculate   Office Visit on 04/26/2023  Component Date Value   CYSTATIN C 04/26/2023 1.68 (H)    eGFR 04/26/2023 38 (L)    Creatinine, Urine 04/26/2023 58    Protein/Creat Ratio 04/26/2023 155 (H)    Protein/Creatinine Ratio 04/26/2023 0.155 (H)    Total Protein, Urine 04/26/2023 9    Sodium 04/26/2023 134 (L)    Potassium 04/26/2023 3.8    Chloride 04/26/2023 99    CO2 04/26/2023 26    Glucose, Bld 04/26/2023 108 (H)    BUN 04/26/2023 29 (H)    Creatinine, Ser 04/26/2023 1.76 (H)    GFR 04/26/2023 39.87 (L)    Calcium  04/26/2023 9.2   Lab on 04/23/2023  Component Date Value   Sodium 04/23/2023 139    Potassium 04/23/2023 3.5    Chloride 04/23/2023 101    CO2 04/23/2023 26    Glucose, Bld 04/23/2023 141 (H)    BUN 04/23/2023 28 (H)    Creatinine, Ser 04/23/2023 1.55 (H)    GFR 04/23/2023 46.44 (L)    Calcium  04/23/2023 9.4    CYSTATIN C 04/23/2023 1.52 (H)    eGFR 04/23/2023 44 (L)   Lab on 04/19/2023  Component Date Value   Glucose, Bld 04/19/2023 106 (H)    BUN 04/19/2023 31 (H)    Creat 04/19/2023 1.65 (H)    eGFR 04/19/2023 46 (L)    BUN/Creatinine Ratio 04/19/2023 19    Sodium 04/19/2023 132 (L)    Potassium 04/19/2023 3.5    Chloride 04/19/2023 99    CO2 04/19/2023 21    Calcium  04/19/2023 9.5    CYSTATIN C 04/19/2023 1.48 (H)    eGFR 04/19/2023 45 (L)   Office Visit on 04/09/2023  Component Date Value   Sodium 04/09/2023 138    Potassium 04/09/2023 4.4    Chloride 04/09/2023 104     CO2 04/09/2023 26    Glucose, Bld 04/09/2023 75    BUN 04/09/2023 24 (H)    Creatinine, Ser 04/09/2023 1.28    GFR 04/09/2023 58.44 (L)    Calcium  04/09/2023 9.5    Microalb, Ur 04/09/2023 <0.7    Creatinine,U 04/09/2023 84.9    Microalb Creat Ratio 04/09/2023 Unable to calculate    CYSTATIN C 04/09/2023 1.37 (H)    eGFR 04/09/2023 50 (L)    Color, Urine 04/09/2023 YELLOW    APPearance 04/09/2023 CLEAR    Specific Gravity, Urine 04/09/2023 1.017    pH 04/09/2023 6.0    Glucose, UA 04/09/2023 NEGATIVE    Bilirubin Urine 04/09/2023 NEGATIVE    Ketones, ur 04/09/2023 NEGATIVE    Hgb urine dipstick 04/09/2023 NEGATIVE    Protein, ur 04/09/2023 NEGATIVE    Nitrites, Initial 04/09/2023 NEGATIVE    Leukocyte Esterase 04/09/2023 NEGATIVE    WBC, UA 04/09/2023 NONE SEEN    RBC / HPF 04/09/2023 NONE SEEN    Squamous Epithelial / HPF 04/09/2023 NONE SEEN    Bacteria, UA 04/09/2023 NONE SEEN    Hyaline Cast 04/09/2023 NONE SEEN    Note 04/09/2023     Reflexve Urine Culture 04/09/2023    Office Visit on 03/26/2023  Component Date Value   WBC 03/26/2023 4.2    RBC 03/26/2023 5.10    Hemoglobin 03/26/2023 15.2    HCT 03/26/2023 46.0    MCV 03/26/2023 90.0    MCHC 03/26/2023 33.1    RDW 03/26/2023 14.3    Platelets 03/26/2023 144.0 (L)    Neutrophils Relative % 03/26/2023 49.8    Lymphocytes Relative 03/26/2023 26.9    Monocytes Relative 03/26/2023 14.6 (H)    Eosinophils Relative 03/26/2023 5.9 (H)    Basophils Relative 03/26/2023 2.8    Neutro Abs 03/26/2023 2.1    Lymphs Abs 03/26/2023 1.1    Monocytes Absolute 03/26/2023 0.6    Eosinophils Absolute 03/26/2023 0.3    Basophils Absolute 03/26/2023 0.1    Sodium 03/26/2023 139    Potassium 03/26/2023 4.4    Chloride 03/26/2023 106    CO2 03/26/2023 27    Glucose, Bld 03/26/2023 108 (H)    BUN 03/26/2023 22    Creatinine, Ser 03/26/2023 1.37    Total Bilirubin 03/26/2023 0.4    Alkaline Phosphatase 03/26/2023 100    AST  03/26/2023 18    ALT 03/26/2023 20    Total Protein 03/26/2023 6.5    Albumin 03/26/2023 4.2    GFR 03/26/2023 53.88 (L)    Calcium  03/26/2023 9.2    Cholesterol 03/26/2023 190    Triglycerides 03/26/2023 72.0    HDL 03/26/2023 51.70    VLDL 03/26/2023 14.4    LDL Cholesterol 03/26/2023 124 (H)    Total CHOL/HDL Ratio 03/26/2023 4    NonHDL 03/26/2023 138.36    PSA 03/26/2023 0.93    TSH 03/26/2023 0.98    Hgb A1c MFr Bld 03/26/2023 6.1   There may be more visits with results that are not included.  No image results found. CT Renal Stone Study Result Date: 05/05/2023 CLINICAL DATA:  Intense flank pain EXAM: CT ABDOMEN AND PELVIS WITHOUT CONTRAST TECHNIQUE: Multidetector CT imaging of the abdomen and pelvis was performed following the standard protocol without IV contrast. RADIATION DOSE REDUCTION: This exam was performed according to the departmental dose-optimization program which includes automated exposure control, adjustment of the mA and/or kV according to patient size and/or use of iterative reconstruction technique. COMPARISON:  Ultrasound 04/28/2023 FINDINGS: Lower chest: Lung bases demonstrate no acute airspace disease. Hepatobiliary: Gallstones. No biliary dilatation. No focal hepatic abnormality Pancreas: Unremarkable. No pancreatic ductal dilatation or surrounding inflammatory changes. Spleen: Normal in size without focal abnormality. Adrenals/Urinary Tract: Adrenal glands are within normal limits. No hydronephrosis. Multiple cysts. Punctate nonobstructing bilateral kidney stones. Bilateral renal cysts for which no imaging follow-up is recommended. The bladder is unremarkable. Stomach/Bowel: Stomach nonenlarged. No dilated small bowel. No acute bowel wall thickening. Moderate to stool burden Vascular/Lymphatic: Aortic atherosclerosis. No enlarged abdominal or pelvic lymph nodes. Reproductive: Prostate is unremarkable. Other: Negative for pelvic effusion or free air Musculoskeletal:  Scoliosis and degenerative changes of the spine. No acute osseous abnormality IMPRESSION: 1. Negative for hydronephrosis or hydroureter. Punctate nonobstructing bilateral kidney stones. 2. Gallstones. 3. Aortic atherosclerosis. Electronically Signed   By: Esmeralda Hedge M.D.   On: 05/05/2023 00:31   US  Renal Result Date: 04/28/2023 CLINICAL DATA:  Chronic kidney disease. EXAM: RENAL / URINARY TRACT ULTRASOUND COMPLETE COMPARISON:  None Available. FINDINGS: Right Kidney: Renal measurements: 10 x 6 x 5.2 cm = volume: 162.9 mL. Mild increased echotexture. No hydronephrosis visualized. Simple cysts are noted, largest measures 2.3 x 2.3 x 2.7 cm, no follow-up is recommended. Left Kidney: Renal measurements: 11 x 6.4  x 6.8 cm = volume: 251.8 mL. Increased echotexture. No hydronephrosis visualized. Simple cysts are noted, largest measures 4.2 x 4 x 4.4 cm, no follow-up is recommended. Bladder: Appears normal for degree of bladder distention. Other: None. IMPRESSION: 1. No acute abnormality identified. 2. Increased echotexture of bilateral kidneys, consistent with medical renal disease. Electronically Signed   By: Anna Barnes M.D.   On: 04/28/2023 09:41   CT CARDIAC SCORING (SELF PAY ONLY) Addendum Date: 04/22/2023 ADDENDUM REPORT: 04/22/2023 03:46 EXAM: OVER-READ INTERPRETATION  CT CHEST The following report is an over-read performed by radiologist Dr. Violeta Grey of [facility] on 04/22/2023. This over-read does not include interpretation of cardiac or coronary anatomy or pathology. The coronary calcium  score interpretation by the cardiologist is attached. COMPARISON:  None. FINDINGS: Cardiovascular: Scattered atherosclerotic calcifications of the aorta are noted. No aneurysmal dilatation is seen. Mediastinum/Nodes: There are no enlarged lymph nodes within the visualized mediastinum. Lungs/Pleura: There is no pleural effusion. The visualized lungs appear clear. Upper abdomen: No significant findings in  the visualized upper abdomen. Musculoskeletal/Chest wall: No chest wall mass or suspicious osseous findings within the visualized chest. IMPRESSION: Aortic Atherosclerosis (ICD10-I70.0). Electronically Signed   By: Violeta Grey M.D.   On: 04/22/2023 03:46   Result Date: 04/22/2023 CLINICAL DATA:  cardiovascular disease risk stratification EXAM: Coronary Calcium  Score TECHNIQUE: A gated, non-contrast computed tomography scan of the heart was performed using 3mm slice thickness. Axial images were analyzed on a dedicated workstation. Calcium  scoring of the coronary arteries was performed using the Agatston method. FINDINGS: Coronary Calcium  Score: Left main: 53.6 Left anterior descending artery: 55.5 Left circumflex artery: 0 Right coronary artery: 0 Total: 109 Percentile: 54th Aortic atherosclerosis. Pericardium: Normal. Non-cardiac: See separate report from [facility]. IMPRESSION: 1. Coronary calcium  score of 109. This was 54th percentile for age-, race-, and sex-matched controls. RECOMMENDATIONS: Coronary artery calcium  (CAC) score is a strong predictor of incident coronary heart disease (CHD) and provides predictive information beyond traditional risk factors. CAC scoring is reasonable to use in the decision to withhold, postpone, or initiate statin therapy in intermediate-risk or selected borderline-risk asymptomatic adults (age 13-75 years and LDL-C >=70 to <190 mg/dL) who do not have diabetes or established atherosclerotic cardiovascular disease (ASCVD).* In intermediate-risk (10-year ASCVD risk >=7.5% to <20%) adults or selected borderline-risk (10-year ASCVD risk >=5% to <7.5%) adults in whom a CAC score is measured for the purpose of making a treatment decision the following recommendations have been made: If CAC=0, it is reasonable to withhold statin therapy and reassess in 5 to 10 years, as long as higher risk conditions are absent (diabetes mellitus, family history of premature CHD in first  degree relatives (males <55 years; females <65 years), cigarette smoking, or LDL >=190 mg/dL). If CAC is 1 to 99, it is reasonable to initiate statin therapy for patients >=68 years of age. If CAC is >=100 or >=75th percentile, it is reasonable to initiate statin therapy at any age. Cardiology referral should be considered for patients with CAC scores >=400 or >=75th percentile. *2018 AHA/ACC/AACVPR/AAPA/ABC/ACPM/ADA/AGS/APhA/ASPC/NLA/PCNA Guideline on the Management of Blood Cholesterol: A Report of the American College of Cardiology/American Heart Association Task Force on Clinical Practice Guidelines. J Am Coll Cardiol. 2019;73(24):3168-3209. Maudine Sos, MD Electronically Signed: By: Maudine Sos M.D. On: 04/07/2023 16:28        04/09/2023    8:47 AM 03/26/2023    8:36 AM 03/23/2022    9:53 AM 11/20/2021    8:51 AM  PHQ 2/9  Scores  PHQ - 2 Score 0 0 0 0   Results LABS Cystatin C: 25% reduction (05/31/2023) Creatinine: 25% reduction (05/31/2023) GFR: 39 (04/26/2023) GFR: 64 (05/10/2023)    Assessment & Plan Resistant hypertension  Chronic kidney disease (CKD), active medical management without dialysis, stage 2 (mild)  AKI (acute kidney injury) (HCC)   Assessment and Plan Assessment & Plan Hypertension   Hypertension remains uncontrolled with blood pressure readings between 130 and 150 mmHg. He is currently on losartan  25 mg twice daily, which cannot be increased due to adverse effects on renal function. Blood pressure control is essential to protect renal function and prevent cardiovascular events. Renal artery stenosis is suspected as a potential cause of resistant hypertension, and a renal duplex ultrasound is planned to investigate. Hydrochlorothiazide  is being reintroduced at half a tablet daily in the morning to aid in blood pressure control, with careful monitoring due to previous kidney function decline when combined with higher doses of losartan . He is advised to  maintain a low sodium diet and avoid beta blockers due to their impact on physical activity and lack of proven benefit in reducing myocardial infarction or stroke in this context. If blood pressure remains uncontrolled, referral to an advanced hypertension clinic will be considered.  Chronic Kidney Disease   Chronic kidney disease is present with fluctuating GFR, previously dropping to 39 mL/min/1.73 m with increased losartan  dosage. Current GFR has stabilized at 64 mL/min/1.73 m after reducing losartan . Kidney function is sensitive to medication changes, necessitating careful management of hypertension to prevent further decline. If a renal artery blockage is found, it may be stented if localized, but extensive blockages may not be repairable. Labs including microalbumin urine, cystatin C, and BMP are ordered to monitor kidney function, and GFR will be closely monitored with today's lab results. Increasing the losartan  dose is avoided due to previous adverse effects on kidney function.  Recording duration: 20 minutes         Orders Placed During this Encounter:  No orders of the defined types were placed in this encounter.  No orders of the defined types were placed in this encounter.  ED Discharge Orders     None         This document was synthesized by artificial intelligence ([employer]bridge) using HIPAA-compliant recording of the clinical interaction;   We discussed the use of AI scribe software for clinical note transcription with the patient, who gave verbal consent to proceed. additional Info: This encounter employed state-of-the-art, real-time, collaborative documentation. The patient actively reviewed and assisted in updating their electronic medical record on a shared screen, ensuring transparency and facilitating joint problem-solving for the problem list, overview, and plan. This approach promotes accurate, informed care. The treatment plan was discussed and reviewed in detail, including  medication safety, potential side effects, and all patient questions. We confirmed understanding and comfort with the plan. Follow-up instructions were established, including contacting the office for any concerns, returning if symptoms worsen, persist, or new symptoms develop, and precautions for potential emergency department visits.

## 2023-06-08 LAB — BASIC METABOLIC PANEL WITH GFR
BUN: 28 mg/dL — ABNORMAL HIGH (ref 6–23)
CO2: 23 meq/L (ref 19–32)
Calcium: 9.2 mg/dL (ref 8.4–10.5)
Chloride: 103 meq/L (ref 96–112)
Creatinine, Ser: 1.72 mg/dL — ABNORMAL HIGH (ref 0.40–1.50)
GFR: 40.95 mL/min — ABNORMAL LOW (ref >60.00)
Glucose, Bld: 92 mg/dL (ref 70–99)
Potassium: 4.7 meq/L (ref 3.5–5.1)
Sodium: 135 meq/L (ref 135–145)

## 2023-06-08 LAB — MICROALBUMIN / CREATININE URINE RATIO
Creatinine,U: 28.9 mg/dL
Microalb Creat Ratio: UNDETERMINED mg/g (ref 0.0–30.0)
Microalb, Ur: <0.7 mg/dL

## 2023-06-08 NOTE — Assessment & Plan Note (Signed)
 Hypertension remains uncontrolled with blood pressure readings between 130 and 150 mmHg. He is currently on losartan  25 mg twice daily, which cannot be increased due to adverse effects on renal function. Blood pressure control is essential to protect renal function and prevent cardiovascular events. Renal artery stenosis is suspected as a potential cause of resistant hypertension, and a renal duplex ultrasound is planned to investigate. Hydrochlorothiazide  is being reintroduced at half a tablet daily in the morning to aid in blood pressure control, with careful monitoring due to previous kidney function decline when combined with higher doses of losartan . He is advised to maintain a low sodium diet and avoid beta blockers due to their impact on physical activity and lack of proven benefit in reducing myocardial infarction or stroke in this context. If blood pressure remains uncontrolled, referral to an advanced hypertension clinic will be considered.

## 2023-06-08 NOTE — Patient Instructions (Signed)
 VISIT SUMMARY:  Today, we discussed your uncontrolled blood pressure and its impact on your chronic kidney disease. We reviewed your current medications and made some adjustments to better manage your conditions. We also planned further investigations to understand the underlying causes of your resistant hypertension.  YOUR PLAN:  -HYPERTENSION: Hypertension, or high blood pressure, remains uncontrolled with your current medication regimen. We suspect renal artery stenosis, a narrowing of the arteries that supply blood to your kidneys, as a potential cause. To investigate this, we have scheduled a renal duplex ultrasound. We are reintroducing hydrochlorothiazide  at half a tablet daily in the morning to help control your blood pressure, while carefully monitoring your kidney function. You should maintain a low sodium diet and avoid beta blockers, as they can affect your physical activity and have not shown benefits in reducing heart attacks or strokes in your case. If your blood pressure remains uncontrolled, we may refer you to an advanced hypertension clinic.  -CHRONIC KIDNEY DISEASE: Chronic kidney disease means your kidneys are not functioning as well as they should. Your kidney function has been sensitive to changes in medication, so we need to manage your blood pressure carefully to prevent further decline. We have ordered labs to monitor your kidney function, including microalbumin urine, cystatin C, and BMP. If we find a blockage in your renal arteries, it may be possible to treat it with a stent, but extensive blockages may not be repairable. We will closely monitor your GFR with today's lab results.  INSTRUCTIONS:  Please follow up with the renal duplex ultrasound as scheduled. Continue taking your medications as prescribed and monitor your blood pressure regularly. Maintain a low sodium diet and avoid beta blockers. We will review your lab results and adjust your treatment plan as needed. If  your blood pressure remains uncontrolled, we may refer you to an advanced hypertension clinic.

## 2023-06-10 ENCOUNTER — Ambulatory Visit: Payer: Self-pay | Admitting: Internal Medicine

## 2023-06-10 ENCOUNTER — Other Ambulatory Visit

## 2023-06-10 LAB — CYSTATIN C WITH GLOMERULAR FILTRATION RATE, ESTIMATED (EGFR)
CYSTATIN C: 1.48 mg/L — ABNORMAL HIGH (ref 0.52–1.20)
eGFR: 45 mL/min/{1.73_m2} — ABNORMAL LOW (ref >=60)

## 2023-06-15 ENCOUNTER — Telehealth: Payer: Self-pay

## 2023-06-15 NOTE — Telephone Encounter (Signed)
 Sent to referral team Eric Crane to take care of  Copied from CRM [id number]. Topic: Medical Record Request - Records Request >> Jun 15, 2023  3:12 PM Eric Crane wrote: Reason for CRM: Patient is scheduled for kidney procedure tomorrow 06/16/23 at 10:30am -  [facility] needs records pertaining to patients kidneys faxed over as soon as possible to [phone].

## 2023-06-16 DIAGNOSIS — R7303 Prediabetes: Secondary | ICD-10-CM | POA: Diagnosis not present

## 2023-06-16 DIAGNOSIS — F3181 Bipolar II disorder: Secondary | ICD-10-CM | POA: Diagnosis not present

## 2023-06-16 DIAGNOSIS — N1831 Chronic kidney disease, stage 3a: Secondary | ICD-10-CM | POA: Diagnosis not present

## 2023-06-16 DIAGNOSIS — I129 Hypertensive chronic kidney disease with stage 1 through stage 4 chronic kidney disease, or unspecified chronic kidney disease: Secondary | ICD-10-CM | POA: Diagnosis not present

## 2023-06-16 DIAGNOSIS — N1832 Chronic kidney disease, stage 3b: Secondary | ICD-10-CM | POA: Diagnosis not present

## 2023-06-16 DIAGNOSIS — E785 Hyperlipidemia, unspecified: Secondary | ICD-10-CM | POA: Diagnosis not present

## 2023-06-20 LAB — LAB REPORT - SCANNED
Albumin, Urine POC: 5.3
Creatinine, POC: 75.4 mg/dL
EGFR: 51
Microalb Creat Ratio: 7

## 2023-06-22 ENCOUNTER — Ambulatory Visit: Payer: Self-pay | Admitting: Internal Medicine

## 2023-06-22 NOTE — Progress Notes (Signed)
 Lab Results      Component                Value               Date                           EGFR                     51.0                06/20/2023                EGFR                     45 (L)              06/07/2023                EGFR                     42 (L)              05/28/2023                EGFR                     47 (L)              05/18/2023                EGFR                     56 (L)              05/10/2023

## 2023-06-23 ENCOUNTER — Encounter: Payer: Self-pay | Admitting: Internal Medicine

## 2023-06-23 ENCOUNTER — Ambulatory Visit (INDEPENDENT_AMBULATORY_CARE_PROVIDER_SITE_OTHER): Admitting: Internal Medicine

## 2023-06-23 VITALS — BP 102/60 | HR 76 | Temp 97.7°F | Ht 67.0 in | Wt 165.8 lb

## 2023-06-23 DIAGNOSIS — I10 Essential (primary) hypertension: Secondary | ICD-10-CM

## 2023-06-23 DIAGNOSIS — N179 Acute kidney failure, unspecified: Secondary | ICD-10-CM | POA: Diagnosis not present

## 2023-06-23 DIAGNOSIS — Q613 Polycystic kidney, unspecified: Secondary | ICD-10-CM | POA: Insufficient documentation

## 2023-06-23 DIAGNOSIS — R972 Elevated prostate specific antigen [PSA]: Secondary | ICD-10-CM | POA: Insufficient documentation

## 2023-06-23 DIAGNOSIS — I1A0 Resistant hypertension: Secondary | ICD-10-CM

## 2023-06-23 DIAGNOSIS — R42 Dizziness and giddiness: Secondary | ICD-10-CM | POA: Diagnosis not present

## 2023-06-23 DIAGNOSIS — Z79899 Other long term (current) drug therapy: Secondary | ICD-10-CM | POA: Diagnosis not present

## 2023-06-23 DIAGNOSIS — N183 Chronic kidney disease, stage 3 unspecified: Secondary | ICD-10-CM

## 2023-06-23 DIAGNOSIS — N200 Calculus of kidney: Secondary | ICD-10-CM

## 2023-06-23 MED ORDER — LOSARTAN POTASSIUM 25 MG PO TABS: 37.5000 mg | ORAL_TABLET | Freq: Two times a day (BID) | ORAL | Status: DC

## 2023-06-23 MED ORDER — DOXAZOSIN MESYLATE 2 MG PO TABS: 2.0000 mg | ORAL_TABLET | Freq: Every day | ORAL | Status: DC

## 2023-06-23 MED ORDER — OLMESARTAN MEDOXOMIL 20 MG PO TABS
20.0000 mg | ORAL_TABLET | Freq: Every day | ORAL | 3 refills | Status: DC
Start: 2023-06-23 — End: 2023-07-25

## 2023-06-23 NOTE — Patient Instructions (Signed)
 VISIT SUMMARY:  During your visit, we discussed your dizziness and low blood pressure, which seem to be related to your current antihypertensive medications. We also reviewed your hypertension management and chronic kidney disease.  YOUR PLAN:  -DIZZINESS DUE TO ANTIHYPERTENSIVE MEDICATION: Your dizziness is likely caused by your blood pressure medications, specifically doxazosin  and hydrochlorothiazide . Doxazosin  can affect how your body regulates blood pressure when you change positions, and hydrochlorothiazide  can affect your body's fluid balance. We will reduce your doxazosin  to once at bedtime and discontinue hydrochlorothiazide  after a few days to see if this helps with your dizziness.  -HYPERTENSION: Hypertension, or high blood pressure, is being managed with several medications. Your blood pressure readings are generally in the high 130s, with occasional spikes. We will switch from losartan  to olmesartan 20 mg once daily to help control your blood pressure better and reduce the number of pills you take. If your blood pressure is not well controlled, we may increase the olmesartan dose. Please schedule a follow-up in 10-12 days after your lab work in 5-7days   -CHRONIC KIDNEY DISEASE, UNSPECIFIED: Chronic kidney disease means your kidneys are not working as well as they should. Your condition is stable with your current treatment. We will switch from losartan  to olmesartan, which should have less impact on your kidney function. We will also order some tests to monitor your kidney function and review the results of your renal ultrasound. Please follow up with your nephrologist in one month.  INSTRUCTIONS:  Please follow up in 10-12 days after your lab work to review your blood pressure and kidney function. Additionally, you have a renal test scheduled for tomorrow and a follow-up appointment with your nephrologist in one month.

## 2023-06-23 NOTE — Assessment & Plan Note (Signed)
 Hypertension is managed with multiple antihypertensive agents. Blood pressure averages in the high 130s systolic, occasionally spiking into the 140s. Current regimen includes amlodipine , doxazosin , losartan , and hydrochlorothiazide . Transition from losartan  to olmesartan 20 mg once daily to potentially improve blood pressure control and reduce pill burden. Consider increasing olmesartan to 1.5 tablets if blood pressure is not adequately controlled. Schedule follow-up in 10-12 days after lab work.

## 2023-06-23 NOTE — Assessment & Plan Note (Signed)
 Updated problem overview for this problem to improve longitudinal management Associated with hypertension, consider genetic testing given the resistant hypertension.

## 2023-06-23 NOTE — Progress Notes (Signed)
 ==============================  [facility]: [phone]   -- Medical Office Visit --  Patient: Eric Crane      Age: 68 y.o.       Sex:  male  Date:   06/23/2023 Today's Healthcare Provider: Anthon Kins, MD  ==============================   Chief Complaint: Resistant hypertension (Pt has been to kidney dr and has results in chart.)  Discussed the use of AI scribe software for clinical note transcription with the patient, who gave verbal consent to proceed.  History of Present Illness  68 year old male with hypertension who presents with dizziness and low blood pressure.  He experiences dizziness, particularly when standing up or bending over, which began after starting hydrochlorothiazide  (HCTZ). The dizziness has been persistent but improved slightly when he began taking the medication at night. It occurs frequently during the day.  He is on multiple antihypertensive medications, including amlodipine  5 mg twice daily, doxazosin  2 mg twice daily, hydrochlorothiazide  12.5 mg once daily, and losartan  25 mg twice daily. He has been taking half the prescribed dose of hydrochlorothiazide  as instructed. Additionally, he takes Jardiance  at the maximum dose and olivastatin.  He has a history of kidney issues, with stable kidney function recently. Previous attempts to increase losartan  resulted in decreased kidney filtration rates. He monitors his blood pressure regularly, with recent readings averaging in the high 130s systolic, occasional spikes into the 140s, and a recent low of 102 systolic. His best reading was 117/69, which did not cause dizziness.  He has a follow-up appointment with a nephrologist in one month and is due for renal testing tomorrow. Previous tests have shown no kidney damage, only lower filtration rates.  Background Reviewed: Problem List: has Primary osteoarthritis of right knee; Weight disorder; FH: heart disease;  Osteoarthritis of lower back; Hyperlipidemia, acquired; Thrombocytopenia (HCC); Attention deficit hyperactivity disorder (ADHD); Bipolar 2 disorder (HCC); H/O total knee replacement; Prediabetes; Statin intolerance; Status post total right knee replacement; Pain in left hip; Screening PSA (prostate specific antigen); Chronic kidney disease (CKD), active medical management without dialysis, stage 3 (moderate) (HCC); Hypertension; Coronary artery calcification; Elevated serum creatinine; Nephrolithiasis; Gallstones; and Injury to kidney on their problem list. Past Medical History:  has a past medical history of Bipolar 2 disorder (HCC) (11/20/2021), Depression, FH: heart disease (11/20/2021), Osteoarthritis of lower back (11/20/2021), Overweight (11/20/2021), and Thrombocytopenia (HCC) (11/20/2021). Past Surgical History:   has a past surgical history that includes Colonoscopy with propofol  (N/A, 02/18/2016); Tonsillectomy; and Total knee arthroplasty (Right, 02/02/2022). Social History:   reports that he has never smoked. He has never used smokeless tobacco. He reports that he does not drink alcohol and does not use drugs. Family History:  family history includes Heart disease in his mother. Allergies:  has no known allergies.   Medication Reconciliation: Current Outpatient Medications on File Prior to Visit  Medication Sig   albuterol  (VENTOLIN  HFA) 108 (90 Base) MCG/ACT inhaler Inhale 2 puffs into the lungs every 6 (six) hours as needed for wheezing or shortness of breath.   amLODipine  (NORVASC ) 5 MG tablet Take 1 tablet (5 mg total) by mouth in the morning and at bedtime.   amphetamine-dextroamphetamine (ADDERALL) 20 MG tablet Take 20 mg by mouth 3 (three) times daily.   aspirin  EC 81 MG tablet Take 1 tablet (81 mg total) by mouth daily. Swallow whole.   empagliflozin  (JARDIANCE ) 25 MG TABS tablet Take 1 tablet (25 mg total) by mouth daily before breakfast.   lamoTRIgine  (LAMICTAL )  200 MG tablet Take  200 mg by mouth daily.   lovastatin  (MEVACOR ) 20 MG tablet TAKE 1 TABLET BY MOUTH AT BEDTIME   lurasidone (LATUDA) 80 MG TABS tablet Take 80 mg by mouth daily.   QUEtiapine  Fumarate (SEROQUEL  XR) 150 MG 24 hr tablet Take 300 mg by mouth at bedtime.   No current facility-administered medications on file prior to visit.   Medications Discontinued During This Encounter  Medication Reason   hydrochlorothiazide  (HYDRODIURIL ) 25 MG tablet Side effect (s)   doxazosin  (CARDURA ) 2 MG tablet    losartan  (COZAAR ) 25 MG tablet      Physical Exam:    06/23/2023    9:23 AM 06/07/2023    3:27 PM 05/28/2023   10:56 AM  Vitals with BMI  Height 5\' 7"  5\' 7"  5\' 7"   Weight 165 lbs 13 oz 169 lbs 166 lbs 3 oz  BMI 25.96 26.46 26.02  Systolic 102 134 161  Diastolic 60 68 60  Pulse 76 56 70  Vital signs reviewed.  Nursing notes reviewed. Weight trend reviewed. Physical Exam General Appearance:  No acute distress appreciable.   Well-groomed, healthy-appearing male.  Well proportioned with no abnormal fat distribution.  Good muscle tone. Pulmonary:  Normal work of breathing at rest, no respiratory distress apparent. SpO2: 98 %  Musculoskeletal: All extremities are intact.  Neurological:  Awake, alert, oriented, and engaged.  No obvious focal neurological deficits or cognitive impairments.  Sensorium seems unclouded.   Speech is clear and coherent with logical content. Psychiatric:  Appropriate mood, pleasant and cooperative demeanor, thoughtful and engaged during the exam     No results found for any visits on 06/23/23. Scanned Document on 06/22/2023  Component Date Value   EGFR 06/20/2023 51.0    Creatinine, POC 06/20/2023 75.4    Albumin, Urine POC 06/20/2023 5.3    Microalb Creat Ratio 06/20/2023 7   Office Visit on 06/07/2023  Component Date Value   Sodium 06/07/2023 135    Potassium 06/07/2023 4.7    Chloride 06/07/2023 103    CO2 06/07/2023 23    Glucose, Bld 06/07/2023 92    BUN  06/07/2023 28 (H)    Creatinine, Ser 06/07/2023 1.72 (H)    GFR 06/07/2023 40.95 (L)    Calcium  06/07/2023 9.2    CYSTATIN C 06/07/2023 1.48 (H)    eGFR 06/07/2023 45 (L)    Microalb, Ur 06/07/2023 <0.7    Creatinine,U 06/07/2023 28.9    Microalb Creat Ratio 06/07/2023 Unable to calculate   Office Visit on 05/28/2023  Component Date Value   Sodium 05/28/2023 136    Potassium 05/28/2023 4.7    Chloride 05/28/2023 103    CO2 05/28/2023 25    Glucose, Bld 05/28/2023 117 (H)    BUN 05/28/2023 24 (H)    Creatinine, Ser 05/28/2023 1.54 (H)    GFR 05/28/2023 46.77 (L)    Calcium  05/28/2023 9.2    CYSTATIN C 05/28/2023 1.56 (H)    eGFR 05/28/2023 42 (L)    Color, Urine 05/28/2023 YELLOW    APPearance 05/28/2023 CLEAR    Specific Gravity, Urine 05/28/2023 1.010    pH 05/28/2023 6.0    Total Protein, Urine 05/28/2023 NEGATIVE    Urine Glucose 05/28/2023 >=1000 (A)    Ketones, ur 05/28/2023 NEGATIVE    Bilirubin Urine 05/28/2023 NEGATIVE    Hgb urine dipstick 05/28/2023 NEGATIVE    Urobilinogen, UA 05/28/2023 0.2    Leukocytes,Ua 05/28/2023 NEGATIVE    Nitrite 05/28/2023 NEGATIVE  WBC, UA 05/28/2023 0-2/hpf    RBC / HPF 05/28/2023 0-2/hpf   Office Visit on 05/17/2023  Component Date Value   Sodium 05/17/2023 135    Potassium 05/17/2023 4.3    Chloride 05/17/2023 102    CO2 05/17/2023 22    Glucose, Bld 05/17/2023 96    BUN 05/17/2023 25 (H)    Creatinine, Ser 05/17/2023 1.43    GFR 05/17/2023 51.13 (L)    Calcium  05/17/2023 9.5    Microalb, Ur 05/17/2023 <0.7    Creatinine,U 05/17/2023 19.0    Microalb Creat Ratio 05/17/2023 Unable to calculate    CYSTATIN C 05/18/2023 1.44 (H)    eGFR 05/18/2023 47 (L)   Office Visit on 05/10/2023  Component Date Value   Sodium 05/10/2023 137    Potassium 05/10/2023 4.0    Chloride 05/10/2023 104    CO2 05/10/2023 25    Glucose, Bld 05/10/2023 96    BUN 05/10/2023 20    Creatinine, Ser 05/10/2023 1.18    GFR 05/10/2023 64.40     Calcium  05/10/2023 9.1    CYSTATIN C 05/10/2023 1.25 (H)    eGFR 05/10/2023 56 (L)    Microalb, Ur 05/10/2023 <0.7    Creatinine,U 05/10/2023 13.3    Microalb Creat Ratio 05/10/2023 Unable to calculate   Admission on 05/04/2023, Discharged on 05/05/2023  Component Date Value   Color, Urine 05/04/2023 COLORLESS (A)    APPearance 05/04/2023 CLEAR    Specific Gravity, Urine 05/04/2023 1.007    pH 05/04/2023 5.5    Glucose, UA 05/04/2023 500 (A)    Hgb urine dipstick 05/04/2023 NEGATIVE    Bilirubin Urine 05/04/2023 NEGATIVE    Ketones, ur 05/04/2023 NEGATIVE    Protein, ur 05/04/2023 NEGATIVE    Nitrite 05/04/2023 NEGATIVE    Leukocytes,Ua 05/04/2023 NEGATIVE    RBC / HPF 05/04/2023 0-5    WBC, UA 05/04/2023 0-5    Bacteria, UA 05/04/2023 NONE SEEN    Squamous Epithelial / HPF 05/04/2023 0-5    Sodium 05/04/2023 133 (L)    Potassium 05/04/2023 3.9    Chloride 05/04/2023 101    CO2 05/04/2023 22    Glucose, Bld 05/04/2023 100 (H)    BUN 05/04/2023 22    Creatinine, Ser 05/04/2023 1.97 (H)    Calcium  05/04/2023 9.2    GFR, Estimated 05/04/2023 37 (L)    Anion gap 05/04/2023 10    WBC 05/04/2023 7.1    RBC 05/04/2023 4.99    Hemoglobin 05/04/2023 14.7    HCT 05/04/2023 43.0    MCV 05/04/2023 86.2    MCH 05/04/2023 29.5    MCHC 05/04/2023 34.2    RDW 05/04/2023 13.0    Platelets 05/04/2023 173    nRBC 05/04/2023 0.0   Lab on 05/03/2023  Component Date Value   CYSTATIN C 05/03/2023 1.29 (H)    eGFR 05/03/2023 54 (L)    Sodium 05/03/2023 137    Potassium 05/03/2023 3.7    Chloride 05/03/2023 104    CO2 05/03/2023 24    Glucose, Bld 05/03/2023 101 (H)    BUN 05/03/2023 21    Creatinine, Ser 05/03/2023 1.41    GFR 05/03/2023 52.02 (L)    Calcium  05/03/2023 9.3    Microalb, Ur 05/03/2023 <0.7    Creatinine,U 05/03/2023 65.1    Microalb Creat Ratio 05/03/2023 Unable to calculate   Office Visit on 04/26/2023  Component Date Value   CYSTATIN C 04/26/2023 1.68 (H)     eGFR 04/26/2023 38 (L)  Creatinine, Urine 04/26/2023 58    Protein/Creat Ratio 04/26/2023 155 (H)    Protein/Creatinine Ratio 04/26/2023 0.155 (H)    Total Protein, Urine 04/26/2023 9    Sodium 04/26/2023 134 (L)    Potassium 04/26/2023 3.8    Chloride 04/26/2023 99    CO2 04/26/2023 26    Glucose, Bld 04/26/2023 108 (H)    BUN 04/26/2023 29 (H)    Creatinine, Ser 04/26/2023 1.76 (H)    GFR 04/26/2023 39.87 (L)    Calcium  04/26/2023 9.2   Lab on 04/23/2023  Component Date Value   Sodium 04/23/2023 139    Potassium 04/23/2023 3.5    Chloride 04/23/2023 101    CO2 04/23/2023 26    Glucose, Bld 04/23/2023 141 (H)    BUN 04/23/2023 28 (H)    Creatinine, Ser 04/23/2023 1.55 (H)    GFR 04/23/2023 46.44 (L)    Calcium  04/23/2023 9.4    CYSTATIN C 04/23/2023 1.52 (H)    eGFR 04/23/2023 44 (L)   Lab on 04/19/2023  Component Date Value   Glucose, Bld 04/19/2023 106 (H)    BUN 04/19/2023 31 (H)    Creat 04/19/2023 1.65 (H)    eGFR 04/19/2023 46 (L)    BUN/Creatinine Ratio 04/19/2023 19    Sodium 04/19/2023 132 (L)    Potassium 04/19/2023 3.5    Chloride 04/19/2023 99    CO2 04/19/2023 21    Calcium  04/19/2023 9.5    CYSTATIN C 04/19/2023 1.48 (H)    eGFR 04/19/2023 45 (L)   There may be more visits with results that are not included.  No image results found. CT Renal Stone Study Result Date: 05/05/2023 CLINICAL DATA:  Intense flank pain EXAM: CT ABDOMEN AND PELVIS WITHOUT CONTRAST TECHNIQUE: Multidetector CT imaging of the abdomen and pelvis was performed following the standard protocol without IV contrast. RADIATION DOSE REDUCTION: This exam was performed according to the departmental dose-optimization program which includes automated exposure control, adjustment of the mA and/or kV according to patient size and/or use of iterative reconstruction technique. COMPARISON:  Ultrasound 04/28/2023 FINDINGS: Lower chest: Lung bases demonstrate no acute airspace disease. Hepatobiliary:  Gallstones. No biliary dilatation. No focal hepatic abnormality Pancreas: Unremarkable. No pancreatic ductal dilatation or surrounding inflammatory changes. Spleen: Normal in size without focal abnormality. Adrenals/Urinary Tract: Adrenal glands are within normal limits. No hydronephrosis. Multiple cysts. Punctate nonobstructing bilateral kidney stones. Bilateral renal cysts for which no imaging follow-up is recommended. The bladder is unremarkable. Stomach/Bowel: Stomach nonenlarged. No dilated small bowel. No acute bowel wall thickening. Moderate to stool burden Vascular/Lymphatic: Aortic atherosclerosis. No enlarged abdominal or pelvic lymph nodes. Reproductive: Prostate is unremarkable. Other: Negative for pelvic effusion or free air Musculoskeletal: Scoliosis and degenerative changes of the spine. No acute osseous abnormality IMPRESSION: 1. Negative for hydronephrosis or hydroureter. Punctate nonobstructing bilateral kidney stones. 2. Gallstones. 3. Aortic atherosclerosis. Electronically Signed   By: Esmeralda Hedge M.D.   On: 05/05/2023 00:31   US  Renal Result Date: 04/28/2023 CLINICAL DATA:  Chronic kidney disease. EXAM: RENAL / URINARY TRACT ULTRASOUND COMPLETE COMPARISON:  None Available. FINDINGS: Right Kidney: Renal measurements: 10 x 6 x 5.2 cm = volume: 162.9 mL. Mild increased echotexture. No hydronephrosis visualized. Simple cysts are noted, largest measures 2.3 x 2.3 x 2.7 cm, no follow-up is recommended. Left Kidney: Renal measurements: 11 x 6.4 x 6.8 cm = volume: 251.8 mL. Increased echotexture. No hydronephrosis visualized. Simple cysts are noted, largest measures 4.2 x 4 x 4.4 cm, no follow-up is  recommended. Bladder: Appears normal for degree of bladder distention. Other: None. IMPRESSION: 1. No acute abnormality identified. 2. Increased echotexture of bilateral kidneys, consistent with medical renal disease. Electronically Signed   By: Anna Barnes M.D.   On: 04/28/2023 09:41   CT CARDIAC  SCORING (SELF PAY ONLY) Addendum Date: 04/22/2023 ADDENDUM REPORT: 04/22/2023 03:46 EXAM: OVER-READ INTERPRETATION  CT CHEST The following report is an over-read performed by radiologist Dr. Violeta Grey of [facility] on 04/22/2023. This over-read does not include interpretation of cardiac or coronary anatomy or pathology. The coronary calcium  score interpretation by the cardiologist is attached. COMPARISON:  None. FINDINGS: Cardiovascular: Scattered atherosclerotic calcifications of the aorta are noted. No aneurysmal dilatation is seen. Mediastinum/Nodes: There are no enlarged lymph nodes within the visualized mediastinum. Lungs/Pleura: There is no pleural effusion. The visualized lungs appear clear. Upper abdomen: No significant findings in the visualized upper abdomen. Musculoskeletal/Chest wall: No chest wall mass or suspicious osseous findings within the visualized chest. IMPRESSION: Aortic Atherosclerosis (ICD10-I70.0). Electronically Signed   By: Violeta Grey M.D.   On: 04/22/2023 03:46   Result Date: 04/22/2023 CLINICAL DATA:  cardiovascular disease risk stratification EXAM: Coronary Calcium  Score TECHNIQUE: A gated, non-contrast computed tomography scan of the heart was performed using 3mm slice thickness. Axial images were analyzed on a dedicated workstation. Calcium  scoring of the coronary arteries was performed using the Agatston method. FINDINGS: Coronary Calcium  Score: Left main: 53.6 Left anterior descending artery: 55.5 Left circumflex artery: 0 Right coronary artery: 0 Total: 109 Percentile: 54th Aortic atherosclerosis. Pericardium: Normal. Non-cardiac: See separate report from [facility]. IMPRESSION: 1. Coronary calcium  score of 109. This was 54th percentile for age-, race-, and sex-matched controls. RECOMMENDATIONS: Coronary artery calcium  (CAC) score is a strong predictor of incident coronary heart disease (CHD) and provides predictive information beyond traditional risk  factors. CAC scoring is reasonable to use in the decision to withhold, postpone, or initiate statin therapy in intermediate-risk or selected borderline-risk asymptomatic adults (age 69-75 years and LDL-C >=70 to <190 mg/dL) who do not have diabetes or established atherosclerotic cardiovascular disease (ASCVD).* In intermediate-risk (10-year ASCVD risk >=7.5% to <20%) adults or selected borderline-risk (10-year ASCVD risk >=5% to <7.5%) adults in whom a CAC score is measured for the purpose of making a treatment decision the following recommendations have been made: If CAC=0, it is reasonable to withhold statin therapy and reassess in 5 to 10 years, as long as higher risk conditions are absent (diabetes mellitus, family history of premature CHD in first degree relatives (males <55 years; females <65 years), cigarette smoking, or LDL >=190 mg/dL). If CAC is 1 to 99, it is reasonable to initiate statin therapy for patients >=11 years of age. If CAC is >=100 or >=75th percentile, it is reasonable to initiate statin therapy at any age. Cardiology referral should be considered for patients with CAC scores >=400 or >=75th percentile. *2018 AHA/ACC/AACVPR/AAPA/ABC/ACPM/ADA/AGS/APhA/ASPC/NLA/PCNA Guideline on the Management of Blood Cholesterol: A Report of the American College of Cardiology/American Heart Association Task Force on Clinical Practice Guidelines. J Am Coll Cardiol. 2019;73(24):3168-3209. Maudine Sos, MD Electronically Signed: By: Maudine Sos M.D. On: 04/07/2023 16:28        04/09/2023    8:47 AM 03/26/2023    8:36 AM 03/23/2022    9:53 AM 11/20/2021    8:51 AM  PHQ 2/9 Scores  PHQ - 2 Score 0 0 0 0   Results LABS GFR: 102 mL/min/1.73 m    Assessment & Plan Resistant hypertension Hypertension  is managed with multiple antihypertensive agents. Blood pressure averages in the high 130s systolic, occasionally spiking into the 140s. Current regimen includes amlodipine , doxazosin , losartan ,  and hydrochlorothiazide . Transition from losartan  to olmesartan 20 mg once daily to potentially improve blood pressure control and reduce pill burden. Consider increasing olmesartan to 1.5 tablets if blood pressure is not adequately controlled. Schedule follow-up in 10-12 days after lab work. Chronic kidney disease (CKD), active medical management without dialysis, stage 3 (moderate) (HCC) Chronic kidney disease is well-managed with the current regimen. Concerns about losartan  affecting kidney filtration rates have been addressed. Transition to olmesartan is expected to have less impact on kidney function. Nephrologist's input was considered, and the plan was adjusted accordingly. Order BMP with GFR, microalbumin, and Cystatin C in one week to monitor kidney function. Review renal ultrasound results once available. Follow up with nephrologist in one month. Medication management We followed recommendations from nephrology for adjusting medication(s) today. Reviewed. Acute renal failure, unspecified acute renal failure type (HCC) Due to challenges with safely escalating arb with over 30% drops in GFR, we use cystatin C.  We also have pending renal duplex and nephrology support. He is drinking plenty of fluid and we seem to have avoided any real kidney damage in the process, as measured by microalbumin  Lab Results  Component Value Date   MICROALBUR <0.7 06/07/2023   MICROALBUR <0.7 05/17/2023   MICROALBUR <0.7 05/10/2023   MICROALBUR <0.7 05/03/2023   MICROALBUR <0.7 04/09/2023   Dizziness Dizziness is linked to antihypertensive medications, specifically doxazosin , which affects baroreceptor response, and hydrochlorothiazide , which impacts fluid balance. Dizziness occurs upon standing and bending due to transient blood pressure drops. Reduce doxazosin  to once at bedtime to minimize dizziness. Discontinue hydrochlorothiazide  after a few days to assess its impact on dizziness and blood  pressure. Polycystic kidney disease Updated problem overview for this problem to improve longitudinal management Associated with hypertension, consider genetic testing given the resistant hypertension.       Orders Placed During this Encounter:  No orders of the defined types were placed in this encounter.  Meds ordered this encounter  Medications   losartan  (COZAAR ) 25 MG tablet    Sig: Take 1.5 tablets (37.5 mg total) by mouth 2 (two) times daily. Start for real.   doxazosin  (CARDURA ) 2 MG tablet    Sig: Take 1 tablet (2 mg total) by mouth at bedtime.   Medical Decision Making: 1 or more chronic illnesses with exacerbation,  progression, or side effects of treatment 2 or more stable chronic illnesses     Ordering of each unique test; Prescription drug management       This document was synthesized by artificial intelligence ([employer]bridge) using HIPAA-compliant recording of the clinical interaction;   We discussed the use of AI scribe software for clinical note transcription with the patient, who gave verbal consent to proceed. additional Info: This encounter employed state-of-the-art, real-time, collaborative documentation. The patient actively reviewed and assisted in updating their electronic medical record on a shared screen, ensuring transparency and facilitating joint problem-solving for the problem list, overview, and plan. This approach promotes accurate, informed care. The treatment plan was discussed and reviewed in detail, including medication safety, potential side effects, and all patient questions. We confirmed understanding and comfort with the plan. Follow-up instructions were established, including contacting the office for any concerns, returning if symptoms worsen, persist, or new symptoms develop, and precautions for potential emergency department visits.

## 2023-06-23 NOTE — Assessment & Plan Note (Signed)
 We followed recommendations from nephrology for adjusting medication(s) today. Reviewed.

## 2023-06-23 NOTE — Assessment & Plan Note (Signed)
 Chronic kidney disease is well-managed with the current regimen. Concerns about losartan  affecting kidney filtration rates have been addressed. Transition to olmesartan is expected to have less impact on kidney function. Nephrologist's input was considered, and the plan was adjusted accordingly. Order BMP with GFR, microalbumin, and Cystatin C in one week to monitor kidney function. Review renal ultrasound results once available. Follow up with nephrologist in one month.

## 2023-06-24 ENCOUNTER — Ambulatory Visit
Admission: RE | Admit: 2023-06-24 | Discharge: 2023-06-24 | Disposition: A | Discharge status: Home / Self Care | Source: Ambulatory Visit | Attending: Internal Medicine | Admitting: Internal Medicine

## 2023-06-24 DIAGNOSIS — N179 Acute kidney failure, unspecified: Secondary | ICD-10-CM

## 2023-06-24 DIAGNOSIS — N289 Disorder of kidney and ureter, unspecified: Secondary | ICD-10-CM | POA: Diagnosis not present

## 2023-06-24 DIAGNOSIS — I1A0 Resistant hypertension: Secondary | ICD-10-CM

## 2023-06-24 DIAGNOSIS — N182 Chronic kidney disease, stage 2 (mild): Secondary | ICD-10-CM | POA: Diagnosis not present

## 2023-06-25 ENCOUNTER — Telehealth: Payer: Self-pay

## 2023-06-25 NOTE — Telephone Encounter (Signed)
 Copied from CRM [id number]. Topic: Clinical - Lab/Test Results >> Jun 25, 2023  1:37 PM Chuck Crater wrote: Reason for CRM: Patient would like for his imaging results sent to [facility]. Attention: Dr. Lydia Sams Fax: [phone]

## 2023-06-25 NOTE — Telephone Encounter (Signed)
 Spoke with pt about ultrasound renal screen faxing over to dr [phone]

## 2023-06-25 NOTE — Telephone Encounter (Signed)
 Tried to call pt back to know for sure what all is needing to be sent. Pt did not answer. Left message for pt to call office back.

## 2023-06-29 NOTE — Progress Notes (Unsigned)
 Cardiology Office Note:  .   Date:  06/30/2023 ID:  Eric Crane, DOB 1955/06/07, MRN [id number] PCP: Anthon Kins, MD [facility] Providers Cardiologist:  None   Patient Profile: .      PMH Aortic atherosclerosis Coronary artery disease CT Calcium  score 04/06/23 CAC score 109 (54th percentile) LM 53.6, LAD 55.5, LCx 0, RCA 0 Hypertension CKD Stage III Former tobacco abuse Quit smoking in 1996 Hyperlipidemia       History of Present Illness: .    History of Present Illness Eric Crane is a very pleasant 68 year old male who is here today as a new patient for consult elevated coronary artery calcium  score. He has history of hypertension, high cholesterol, and stage 3 kidney disease. BP has been well controlled, although he often gets higher readings at home.  He questions the accuracy of his electronic arm blood pressure cuff. He has been on lovastatin  for a few months. He is tolerating it well. His LDL improved from 193 to 124 over the past year. He takes aspirin  81 mg daily. He experiences episodes of breathlessness upon waking up, which seemed to have decreased in frequency. He denies orthopnea, edema, or shortness of breath. Occasional swelling in his feet occurs after prolonged standing at work. He has stage 3 kidney disease and takes Jardiance . This is followed closely by his PCP. He quit smoking in 1995 or 1996 and does not consume alcohol. 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 He follows a healthy diet, avoiding fried foods and red meat, and consuming mostly chicken and [place] for protein. He eats whole wheat bread and natural peanut butter, and tries to keep his salt intake low.  Family history: His family history includes Heart disease in his mother.   Discussed the use of AI scribe software for clinical note transcription with the patient, who gave  verbal consent to proceed.  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, semi-retired [employer] 2-3   No results found for: "LIPOA"    ROS: See HPI       Studies Reviewed: Aaron Aas   EKG Interpretation Date/Time:  Wednesday June 30 2023 08:37:38 EDT Ventricular Rate:  52 PR Interval:  160 QRS Duration:  98 QT Interval:  456 QTC Calculation: 424 R Axis:   73  Text Interpretation: Sinus bradycardia When compared with ECG of 02-Feb-2022 08:29, No significant change was found Confirmed by Slater Duncan [phone]) on 06/30/2023 8:53:39 AM      Risk Assessment/Calculations:             Physical Exam:   VS: BP 106/60   Pulse (!) 52   Ht 5\' 7"  (1.702 m)   Wt 168 lb 12.8 oz (76.6 kg)  SpO2 95%   BMI 26.44 kg/m   Wt Readings from Last 3 Encounters:  06/30/23 168 lb 12.8 oz (76.6 kg)  06/23/23 165 lb 12.8 oz (75.2 kg)  06/07/23 169 lb (76.7 kg)     GEN: Well nourished, well developed in no acute distress NECK: No JVD; No carotid bruits CARDIAC: RRR, no murmurs, rubs, gallops RESPIRATORY:  Clear to auscultation without rales, wheezing or rhonchi  ABDOMEN: Soft, non-tender, non-distended EXTREMITIES:  No edema; No deformity     ASSESSMENT AND PLAN: .    Assessment & Plan Coronary artery disease CT calcium  score 04/06/2023 with CAC score of 109 (54th percentile), aortic atherosclerosis.  He mentions fluid around his heart, but I explained that was not seen on CT calcium  score.  He is  very active and denies chest pain, dyspnea, or other symptoms concerning for angina.  We discussed potential stress testing and through shared decision making he feels that he achieves a high level of activity on his own.  ASCVD risk score of 11% and risk factors associated with elevated risk.  BP is well-controlled.  He is not diabetic and quit smoking 20 years ago. EKG today reveals sinus bradycardia with no ST/T abnormality.  LDL is above goal as noted below, we will change him from lovastatin  to rosuvastatin .  He is already on aspirin  and is not having any bleeding concerns.  We will continue aspirin  81 mg daily,rosuvastatin , olmesartan , amlodipine . Focus on secondary prevention including heart healthy mostly plant based diet avoiding saturated fat, processed foods, simple carbohydrates, and sugar along with aiming for at least 150 minutes of moderate intensity exercise each week.   Hypertension   BP is well controlled. He reports higher readings at home.  Ensure proper home BP monitoring with relaxed sitting, feet on the floor, checking 2 hours post-medication, and using fresh batteries in his machine. Renal function is stable on recent labs. No change in anti-hypertensive therapy today.   Chronic kidney disease, stage 3   Stable. Monitored closely by PCP.   Hyperlipidemia LDL goal < 70 Lipid panel completed 03/26/2023 with total cholesterol 119, triglycerides 78, HDL 41.70, LDL. 124. He had improvement in LDL from 193 the year prior. Has been tolerating lovastatin  without concerning side effect.  Advised goal LDL 70 or lower.  We will stop lovastatin -start rosuvastatin  20 mg daily.  I have reviewed potential side effects with him and asked him to notify us  if side effects are bothersome.  We will recheck cholesterol and liver enzymes in 2-3 months, along with check lipoprotein a.   Sinus bradycardia   History of sinus bradycardia. He is asymptomatic.  He notes some difficulty taking deep breaths  when he wakes up in the morning.  No presyncope or syncope. Previously had lightheadedness that has resolved since stopping HCTZ. Follows a very low sodium diet. Will aim to add electrolytes to water. Advised we can consider cardiac monitor for monitoring nocturnal heart rate in the future if symptoms persist.   Plan/Goals: 1: Continue regular exercise for goal of at least 150 minutes of moderate intensity exercise each week. 2: Continue heart healthy diet limiting saturated fat, simple carbohydrates, sugar, and processed foods.        Dispo: 1 year with me  Signed, Slater Duncan, NP-C

## 2023-06-30 ENCOUNTER — Other Ambulatory Visit (INDEPENDENT_AMBULATORY_CARE_PROVIDER_SITE_OTHER)

## 2023-06-30 ENCOUNTER — Encounter (HOSPITAL_BASED_OUTPATIENT_CLINIC_OR_DEPARTMENT_OTHER): Payer: Self-pay | Admitting: Nurse Practitioner

## 2023-06-30 ENCOUNTER — Ambulatory Visit (HOSPITAL_BASED_OUTPATIENT_CLINIC_OR_DEPARTMENT_OTHER): Admitting: Nurse Practitioner

## 2023-06-30 VITALS — BP 106/60 | HR 52 | Ht 67.0 in | Wt 168.8 lb

## 2023-06-30 DIAGNOSIS — E785 Hyperlipidemia, unspecified: Secondary | ICD-10-CM | POA: Diagnosis not present

## 2023-06-30 DIAGNOSIS — I2510 Atherosclerotic heart disease of native coronary artery without angina pectoris: Secondary | ICD-10-CM | POA: Diagnosis not present

## 2023-06-30 DIAGNOSIS — N1831 Chronic kidney disease, stage 3a: Secondary | ICD-10-CM

## 2023-06-30 DIAGNOSIS — I10 Essential (primary) hypertension: Secondary | ICD-10-CM

## 2023-06-30 DIAGNOSIS — Z79899 Other long term (current) drug therapy: Secondary | ICD-10-CM | POA: Diagnosis not present

## 2023-06-30 DIAGNOSIS — R001 Bradycardia, unspecified: Secondary | ICD-10-CM | POA: Diagnosis not present

## 2023-06-30 DIAGNOSIS — N179 Acute kidney failure, unspecified: Secondary | ICD-10-CM | POA: Diagnosis not present

## 2023-06-30 LAB — BASIC METABOLIC PANEL WITH GFR
BUN: 25 mg/dL — ABNORMAL HIGH (ref 6–23)
CO2: 25 meq/L (ref 19–32)
Calcium: 9.3 mg/dL (ref 8.4–10.5)
Chloride: 107 meq/L (ref 96–112)
Creatinine, Ser: 1.45 mg/dL (ref 0.40–1.50)
GFR: 50.24 mL/min — ABNORMAL LOW (ref >60.00)
Glucose, Bld: 99 mg/dL (ref 70–99)
Potassium: 4.4 meq/L (ref 3.5–5.1)
Sodium: 138 meq/L (ref 135–145)

## 2023-06-30 LAB — MICROALBUMIN / CREATININE URINE RATIO
Creatinine,U: 48.8 mg/dL
Microalb Creat Ratio: UNDETERMINED mg/g (ref 0.0–30.0)
Microalb, Ur: <0.7 mg/dL

## 2023-06-30 MED ORDER — ROSUVASTATIN CALCIUM 20 MG PO TABS: 20.0000 mg | ORAL_TABLET | Freq: Every day | ORAL | 3 refills | Status: DC

## 2023-06-30 NOTE — Patient Instructions (Signed)
 Medication Instructions:   DISCONTINUE Lovastatin   START Rosuvastatin  one (1) tablet by mouth ( 20 mg) daily.   *If you need a refill on your cardiac medications before your next appointment, please call your pharmacy*  Lab Work:  Your physician recommends that you return for a FASTING lipid profile/alt/lpa, fasting after midnight in 3 months. Patient given paperwork today.    If you have labs (blood work) drawn today and your tests are completely normal, you will receive your results only by: MyChart Message (if you have MyChart) OR A paper copy in the mail If you have any lab test that is abnormal or we need to change your treatment, we will call you to review the results.  Testing/Procedures:  None ordered.  Follow-Up: At [facility], you and your health needs are our priority.  As part of our continuing mission to provide you with exceptional heart care, our providers are all part of one team.  This team includes your primary Cardiologist (physician) and Advanced Practice Providers or APPs (Physician Assistants and Nurse Practitioners) who all work together to provide you with the care you need, when you need it.  Your next appointment:   1 year(s)  Provider:   Slater Duncan, NP    We recommend signing up for the patient portal called "MyChart".  Sign up information is provided on this After Visit Summary.  MyChart is used to connect with patients for Virtual Visits (Telemedicine).  Patients are able to view lab/test results, encounter notes, upcoming appointments, etc.  Non-urgent messages can be sent to your provider as well.   To learn more about what you can do with MyChart, go to [email or web address].   Other Instructions  Your physician wants you to follow-up in: 1 year.  You will receive a reminder letter in the mail two months in advance. If you don't receive a letter, please call our office to schedule the follow-up appointment.

## 2023-07-01 ENCOUNTER — Ambulatory Visit: Payer: Self-pay | Admitting: Internal Medicine

## 2023-07-01 NOTE — Progress Notes (Signed)
 numbers look great

## 2023-07-02 LAB — CYSTATIN C WITH GLOMERULAR FILTRATION RATE, ESTIMATED (EGFR)
CYSTATIN C: 1.54 mg/L — ABNORMAL HIGH (ref 0.52–1.20)
eGFR: 43 mL/min/{1.73_m2} — ABNORMAL LOW (ref >=60)

## 2023-07-03 NOTE — Progress Notes (Signed)
 We continue to see minor fluctuations in filtration rate with medication(s) changes without evidence of meaningul new kidney injury- though if you just looked at this one check yhou might worry that its dropping 51->43.  Lab Results      Component                Value               Date                      EGFR                     43 (L)              06/30/2023                EGFR                     51.0                06/20/2023                EGFR                     45 (L)              06/07/2023                EGFR                     42 (L)              05/28/2023                EGFR                     47 (L)              05/18/2023                EGFR                     56 (L)              05/10/2023                EGFR                     54 (L)              05/03/2023                EGFR                     38 (L)              04/26/2023                EGFR                     44 (L)              04/23/2023                EGFR                     46 (L)  04/19/2023                EGFR                     45 (L)              04/19/2023

## 2023-07-09 ENCOUNTER — Ambulatory Visit (INDEPENDENT_AMBULATORY_CARE_PROVIDER_SITE_OTHER): Admitting: Internal Medicine

## 2023-07-09 ENCOUNTER — Encounter: Payer: Self-pay | Admitting: Internal Medicine

## 2023-07-09 VITALS — BP 110/58 | HR 82 | Temp 98.0°F | Ht 67.0 in

## 2023-07-09 DIAGNOSIS — E785 Hyperlipidemia, unspecified: Secondary | ICD-10-CM

## 2023-07-09 DIAGNOSIS — I1A0 Resistant hypertension: Secondary | ICD-10-CM | POA: Diagnosis not present

## 2023-07-09 DIAGNOSIS — N183 Chronic kidney disease, stage 3 unspecified: Secondary | ICD-10-CM

## 2023-07-09 LAB — MICROALBUMIN / CREATININE URINE RATIO
Creatinine,U: 56.7 mg/dL
Microalb Creat Ratio: UNDETERMINED mg/g (ref 0.0–30.0)
Microalb, Ur: <0.7 mg/dL

## 2023-07-09 LAB — BASIC METABOLIC PANEL WITH GFR
BUN: 24 mg/dL — ABNORMAL HIGH (ref 6–23)
CO2: 24 meq/L (ref 19–32)
Calcium: 9 mg/dL (ref 8.4–10.5)
Chloride: 105 meq/L (ref 96–112)
Creatinine, Ser: 1.45 mg/dL (ref 0.40–1.50)
GFR: 50.23 mL/min — ABNORMAL LOW (ref >60.00)
Glucose, Bld: 95 mg/dL (ref 70–99)
Potassium: 4.1 meq/L (ref 3.5–5.1)
Sodium: 138 meq/L (ref 135–145)

## 2023-07-09 MED ORDER — AMLODIPINE BESYLATE 10 MG PO TABS: 10.0000 mg | ORAL_TABLET | Freq: Every day | ORAL | 3 refills | Status: DC

## 2023-07-09 MED ORDER — AMLODIPINE BESYLATE 5 MG PO TABS: 10.0000 mg | ORAL_TABLET | Freq: Every day | ORAL | 3 refills | Status: DC

## 2023-07-09 NOTE — Assessment & Plan Note (Signed)
 Hyperlipidemia management includes a recent switch from lovastatin  to rosuvastatin . Initial side effects of lightheadedness and dizziness have resolved, and rosuvastatin  is well-tolerated without muscle aches. The dose may be increased if tolerated. Monitoring for side effects will continue.

## 2023-07-09 NOTE — Assessment & Plan Note (Signed)
 Chronic kidney disease is at stage 3 with a GFR in the 40s, attributed to better blood pressure control rather than kidney damage. The focus is on preventing further decline by maintaining controlled blood pressure. Jardiance  will continue to protect kidney function. Hydration is crucial, with a recommended fluid intake of 2.5 liters per day. Weekly kidney function tests are ordered, and coordination with a kidney specialist for ongoing management is planned.

## 2023-07-09 NOTE — Progress Notes (Signed)
 ==============================  [facility]: [phone]   -- Medical Office Visit --  Patient: Eric Crane      Age: 68 y.o.       Sex:  male  Date:   07/09/2023 Today's Healthcare Provider: Anthon Kins, MD  ==============================   Chief Complaint: Hypertension (Pt has been recording his bp at home past two weeks.122/70 115/68 125/74 139/75 couple of reading he had.)   Discussed the use of AI scribe software for clinical note transcription with the patient, who gave verbal consent to proceed.  History of Present Illness  68 year old male with hypertension and chronic kidney disease who presents for blood pressure management and medication adjustment.  His home blood pressure readings have improved since switching from losartan  to olmesartan , now ranging from 110s to 120s systolic. Previously, his blood pressure was in the 130s over 70s.  He is concerned about his kidney function, noting fluctuations in his filtration rate with changes in blood pressure. He recalls past high blood pressure and ibuprofen  use as potential contributors to his kidney issues.  He recently switched from lovastatin  to Crestor  (rosuvastatin ) and initially experienced lightheadedness and dizziness, which have since resolved. He is currently tolerating the medication well without muscle aches.  His current medication regimen includes high-dose Jardiance , aspirin , amlodipine  (twice daily), doxazosin  (once daily), and Adderall, which he takes as needed, usually two pills a day, sometimes skipping the third dose.  He plays hockey regularly on Fridays and Sundays, and plans to add Mondays when the ice is available.  Lab Results  Component Value Date   MICROALBUR <0.7 07/09/2023   MICROALBUR <0.7 06/30/2023   MICROALBUR <0.7 06/07/2023   MICROALBUR <0.7 05/17/2023   MICROALBUR <0.7 05/10/2023   MICROALBUR <0.7 05/03/2023   MICROALBUR <0.7 04/09/2023    Lab Results  Component Value Date   GFR 50.23 (L) 07/09/2023   GFR 50.24 (L) 06/30/2023   GFR 40.95 (L) 06/07/2023   GFR 46.77 (L) 05/28/2023   GFR 51.13 (L) 05/17/2023   GFR 64.40 05/10/2023   GFR 52.02 (L) 05/03/2023   GFR 39.87 (L) 04/26/2023   GFR 46.44 (L) 04/23/2023   GFR 58.44 (L) 04/09/2023   GFR 53.88 (L) 03/26/2023   GFR 61.76 03/23/2022   EGFR 43 (L) 06/30/2023   EGFR 51.0 06/20/2023   EGFR 45 (L) 06/07/2023   EGFR 42 (L) 05/28/2023   EGFR 47 (L) 05/18/2023   EGFR 56 (L) 05/10/2023   EGFR 54 (L) 05/03/2023   EGFR 38 (L) 04/26/2023   EGFR 44 (L) 04/23/2023   EGFR 46 (L) 04/19/2023   EGFR 45 (L) 04/19/2023   EGFR 50 (L) 04/09/2023      Updated Problem List Entries: No problems updated.  Background Reviewed: Problem List: has Primary osteoarthritis of right knee; Weight disorder; FH: heart disease; Osteoarthritis of lower back; Hyperlipidemia, acquired; Thrombocytopenia (HCC); Attention deficit hyperactivity disorder (ADHD); Bipolar 2 disorder (HCC); H/O total knee replacement; Prediabetes; Statin intolerance; Status post total right knee replacement; Pain in left hip; Medication management; Chronic kidney disease (CKD), active medical management without dialysis, stage 3 (moderate) (HCC); Hypertension; Coronary artery calcification; Elevated serum creatinine; Nephrolithiasis; Gallstones; Injury to kidney; Elevated PSA; and Polycystic kidney disease on their problem list. Past Medical History:  has a past medical history of Arthritis (2021), Bipolar 2 disorder (HCC) (11/20/2021), Chronic kidney disease (03/26/2023), Depression, FH: heart disease (11/20/2021), Hypertension (03/25/2024), Osteoarthritis of lower back (11/20/2021), Overweight (11/20/2021), and Thrombocytopenia (HCC) (11/20/2021).  Past Surgical History:   has a past surgical history that includes Colonoscopy with propofol  (N/A, 02/18/2016); Tonsillectomy; and Total knee arthroplasty (Right,  02/02/2022). Social History:   reports that he has never smoked. He has never used smokeless tobacco. He reports that he does not drink alcohol and does not use drugs. Family History:  family history includes Heart disease in his mother. Allergies:  has no known allergies.   Medication Reconciliation: Current Outpatient Medications on File Prior to Visit  Medication Sig   amphetamine-dextroamphetamine (ADDERALL) 20 MG tablet Take 20 mg by mouth 3 (three) times daily.   aspirin  EC 81 MG tablet Take 1 tablet (81 mg total) by mouth daily. Swallow whole.   empagliflozin  (JARDIANCE ) 25 MG TABS tablet Take 1 tablet (25 mg total) by mouth daily before breakfast.   lamoTRIgine  (LAMICTAL ) 200 MG tablet Take 200 mg by mouth daily.   methocarbamol  (ROBAXIN ) 500 MG tablet Take 500 mg by mouth 3 (three) times daily.   olmesartan  (BENICAR ) 20 MG tablet Take 1 tablet (20 mg total) by mouth daily. Replaces losartan .   QUEtiapine  Fumarate (SEROQUEL  XR) 150 MG 24 hr tablet Take 300 mg by mouth at bedtime.   rosuvastatin  (CRESTOR ) 20 MG tablet Take 1 tablet (20 mg total) by mouth daily.   No current facility-administered medications on file prior to visit.   Medications Discontinued During This Encounter  Medication Reason   doxazosin  (CARDURA ) 2 MG tablet Completed Course   lurasidone (LATUDA) 80 MG TABS tablet Completed Course   amLODipine  (NORVASC ) 5 MG tablet    amLODipine  (NORVASC ) 5 MG tablet      Physical Exam:    07/09/2023    7:47 AM 06/30/2023    8:41 AM 06/23/2023    9:23 AM  Vitals with BMI  Height 5' 7 5' 7 5' 7  Weight  168 lbs 13 oz 165 lbs 13 oz  BMI  26.43 25.96  Systolic 110 106 096  Diastolic 58 60 60  Pulse 82 52 76  Vital signs reviewed.  Nursing notes reviewed. Weight trend reviewed. Physical Exam General Appearance:  No acute distress appreciable.   Well-groomed, healthy-appearing male.  Well proportioned with no abnormal fat distribution.  Good muscle tone. Pulmonary:   Normal work of breathing at rest, no respiratory distress apparent. SpO2: 98 %  Musculoskeletal: All extremities are intact.  Neurological:  Awake, alert, oriented, and engaged.  No obvious focal neurological deficits or cognitive impairments.  Sensorium seems unclouded.   Speech is clear and coherent with logical content.      Results for orders placed or performed in visit on 07/09/23  Basic Metabolic Panel (BMET)  Result Value Ref Range   Sodium 138 135 - 145 mEq/L   Potassium 4.1 3.5 - 5.1 mEq/L   Chloride 105 96 - 112 mEq/L   CO2 24 19 - 32 mEq/L   Glucose, Bld 95 70 - 99 mg/dL   BUN 24 (H) 6 - 23 mg/dL   Creatinine, Ser 0.45 0.40 - 1.50 mg/dL   GFR 40.98 (L) >11.91 mL/min   Calcium  9.0 8.4 - 10.5 mg/dL  Microalbumin / creatinine urine ratio  Result Value Ref Range   Microalb, Ur <0.7 mg/dL   Creatinine,U 47.8 mg/dL   Microalb Creat Ratio Unable to calculate 0.0 - 30.0 mg/g   Office Visit on 07/09/2023  Component Date Value Ref Range Status   Sodium 07/09/2023 138  135 - 145 mEq/L Final   Potassium 07/09/2023 4.1  3.5 -  5.1 mEq/L Final   Chloride 07/09/2023 105  96 - 112 mEq/L Final   CO2 07/09/2023 24  19 - 32 mEq/L Final   Glucose, Bld 07/09/2023 95  70 - 99 mg/dL Final   BUN 16/10/9602 24 (H)  6 - 23 mg/dL Final   Creatinine, Ser 07/09/2023 1.45  0.40 - 1.50 mg/dL Final   GFR 54/09/8117 50.23 (L)  >60.00 mL/min Final   Calcium  07/09/2023 9.0  8.4 - 10.5 mg/dL Final   Microalb, Ur 14/78/2956 <0.7  mg/dL Final   Creatinine,U 21/30/8657 56.7  mg/dL Final   Microalb Creat Ratio 07/09/2023 Unable to calculate  0.0 - 30.0 mg/g Final  Lab on 06/30/2023  Component Date Value Ref Range Status   CYSTATIN C 06/30/2023 1.54 (H)  0.52 - 1.20 mg/L Final   eGFR 06/30/2023 43 (L)  >=60 mL/min/1.15m2 Final   Microalb, Ur 06/30/2023 <0.7  mg/dL Final   Creatinine,U 84/69/6295 48.8  mg/dL Final   Microalb Creat Ratio 06/30/2023 Unable to calculate  0.0 - 30.0 mg/g Final   Sodium  06/30/2023 138  135 - 145 mEq/L Final   Potassium 06/30/2023 4.4  3.5 - 5.1 mEq/L Final   Chloride 06/30/2023 107  96 - 112 mEq/L Final   CO2 06/30/2023 25  19 - 32 mEq/L Final   Glucose, Bld 06/30/2023 99  70 - 99 mg/dL Final   BUN 28/41/3244 25 (H)  6 - 23 mg/dL Final   Creatinine, Ser 06/30/2023 1.45  0.40 - 1.50 mg/dL Final   GFR 01/28/7251 50.24 (L)  >60.00 mL/min Final   Calcium  06/30/2023 9.3  8.4 - 10.5 mg/dL Final  Scanned Document on 06/22/2023  Component Date Value Ref Range Status   EGFR 06/20/2023 51.0   Final   Creatinine, POC 06/20/2023 75.4  mg/dL Final   Albumin, Urine POC 06/20/2023 5.3   Final   Microalb Creat Ratio 06/20/2023 7   Final  Office Visit on 06/07/2023  Component Date Value Ref Range Status   Sodium 06/07/2023 135  135 - 145 mEq/L Final   Potassium 06/07/2023 4.7  3.5 - 5.1 mEq/L Final   Chloride 06/07/2023 103  96 - 112 mEq/L Final   CO2 06/07/2023 23  19 - 32 mEq/L Final   Glucose, Bld 06/07/2023 92  70 - 99 mg/dL Final   BUN 66/44/0347 28 (H)  6 - 23 mg/dL Final   Creatinine, Ser 06/07/2023 1.72 (H)  0.40 - 1.50 mg/dL Final   GFR 42/59/5638 40.95 (L)  >60.00 mL/min Final   Calcium  06/07/2023 9.2  8.4 - 10.5 mg/dL Final   CYSTATIN C 75/64/3329 1.48 (H)  0.52 - 1.20 mg/L Final   eGFR 06/07/2023 45 (L)  >=60 mL/min/1.59m2 Final   Microalb, Ur 06/07/2023 <0.7  mg/dL Final   Creatinine,U 51/88/4166 28.9  mg/dL Final   Microalb Creat Ratio 06/07/2023 Unable to calculate  0.0 - 30.0 mg/g Final  Office Visit on 05/28/2023  Component Date Value Ref Range Status   Sodium 05/28/2023 136  135 - 145 mEq/L Final   Potassium 05/28/2023 4.7  3.5 - 5.1 mEq/L Final   Chloride 05/28/2023 103  96 - 112 mEq/L Final   CO2 05/28/2023 25  19 - 32 mEq/L Final   Glucose, Bld 05/28/2023 117 (H)  70 - 99 mg/dL Final   BUN 07/26/1599 24 (H)  6 - 23 mg/dL Final   Creatinine, Ser 05/28/2023 1.54 (H)  0.40 - 1.50 mg/dL Final   GFR 09/32/3557 46.77 (L)  >  60.00 mL/min Final    Calcium  05/28/2023 9.2  8.4 - 10.5 mg/dL Final   CYSTATIN C 96/04/5407 1.56 (H)  0.52 - 1.20 mg/L Final   eGFR 05/28/2023 42 (L)  >=60 mL/min/1.56m2 Final   Color, Urine 05/28/2023 YELLOW  Yellow;Lt. Yellow;Straw;Dark Yellow;Amber;Green;Red;Brown Final   APPearance 05/28/2023 CLEAR  Clear;Turbid;Slightly Cloudy;Cloudy Final   Specific Gravity, Urine 05/28/2023 1.010  1.000 - 1.030 Final   pH 05/28/2023 6.0  5.0 - 8.0 Final   Total Protein, Urine 05/28/2023 NEGATIVE  Negative Final   Urine Glucose 05/28/2023 >=1000 (A)  Negative Final   Ketones, ur 05/28/2023 NEGATIVE  Negative Final   Bilirubin Urine 05/28/2023 NEGATIVE  Negative Final   Hgb urine dipstick 05/28/2023 NEGATIVE  Negative Final   Urobilinogen, UA 05/28/2023 0.2  0.0 - 1.0 Final   Leukocytes,Ua 05/28/2023 NEGATIVE  Negative Final   Nitrite 05/28/2023 NEGATIVE  Negative Final   WBC, UA 05/28/2023 0-2/hpf  0-2/hpf Final   RBC / HPF 05/28/2023 0-2/hpf  0-2/hpf Final  Office Visit on 05/17/2023  Component Date Value Ref Range Status   Sodium 05/17/2023 135  135 - 145 mEq/L Final   Potassium 05/17/2023 4.3  3.5 - 5.1 mEq/L Final   Chloride 05/17/2023 102  96 - 112 mEq/L Final   CO2 05/17/2023 22  19 - 32 mEq/L Final   Glucose, Bld 05/17/2023 96  70 - 99 mg/dL Final   BUN 81/19/1478 25 (H)  6 - 23 mg/dL Final   Creatinine, Ser 05/17/2023 1.43  0.40 - 1.50 mg/dL Final   GFR 29/56/2130 51.13 (L)  >60.00 mL/min Final   Calcium  05/17/2023 9.5  8.4 - 10.5 mg/dL Final   Microalb, Ur 86/57/8469 <0.7  mg/dL Final   Creatinine,U 62/95/2841 19.0  mg/dL Final   Microalb Creat Ratio 05/17/2023 Unable to calculate  0.0 - 30.0 mg/g Final   CYSTATIN C 05/18/2023 1.44 (H)  0.52 - 1.20 mg/L Final   eGFR 05/18/2023 47 (L)  >=60 mL/min/1.50m2 Final  Office Visit on 05/10/2023  Component Date Value Ref Range Status   Sodium 05/10/2023 137  135 - 145 mEq/L Final   Potassium 05/10/2023 4.0  3.5 - 5.1 mEq/L Final   Chloride 05/10/2023 104  96 -  112 mEq/L Final   CO2 05/10/2023 25  19 - 32 mEq/L Final   Glucose, Bld 05/10/2023 96  70 - 99 mg/dL Final   BUN 32/44/0102 20  6 - 23 mg/dL Final   Creatinine, Ser 05/10/2023 1.18  0.40 - 1.50 mg/dL Final   GFR 72/53/6644 64.40  >60.00 mL/min Final   Calcium  05/10/2023 9.1  8.4 - 10.5 mg/dL Final   CYSTATIN C 03/47/4259 1.25 (H)  0.52 - 1.20 mg/L Final   eGFR 05/10/2023 56 (L)  >=60 mL/min/1.45m2 Final   Microalb, Ur 05/10/2023 <0.7  mg/dL Final   Creatinine,U 56/38/7564 13.3  mg/dL Final   Microalb Creat Ratio 05/10/2023 Unable to calculate  0.0 - 30.0 mg/g Final  Admission on 05/04/2023, Discharged on 05/05/2023  Component Date Value Ref Range Status   Color, Urine 05/04/2023 COLORLESS (A)  YELLOW Final   APPearance 05/04/2023 CLEAR  CLEAR Final   Specific Gravity, Urine 05/04/2023 1.007  1.005 - 1.030 Final   pH 05/04/2023 5.5  5.0 - 8.0 Final   Glucose, UA 05/04/2023 500 (A)  NEGATIVE mg/dL Final   Hgb urine dipstick 05/04/2023 NEGATIVE  NEGATIVE Final   Bilirubin Urine 05/04/2023 NEGATIVE  NEGATIVE Final   Ketones, ur 05/04/2023 NEGATIVE  NEGATIVE mg/dL Final   Protein, ur 16/10/9602 NEGATIVE  NEGATIVE mg/dL Final   Nitrite 54/09/8117 NEGATIVE  NEGATIVE Final   Leukocytes,Ua 05/04/2023 NEGATIVE  NEGATIVE Final   RBC / HPF 05/04/2023 0-5  0 - 5 RBC/hpf Final   WBC, UA 05/04/2023 0-5  0 - 5 WBC/hpf Final   Bacteria, UA 05/04/2023 NONE SEEN  NONE SEEN Final   Squamous Epithelial / HPF 05/04/2023 0-5  0 - 5 /HPF Final   Sodium 05/04/2023 133 (L)  135 - 145 mmol/L Final   Potassium 05/04/2023 3.9  3.5 - 5.1 mmol/L Final   Chloride 05/04/2023 101  98 - 111 mmol/L Final   CO2 05/04/2023 22  22 - 32 mmol/L Final   Glucose, Bld 05/04/2023 100 (H)  70 - 99 mg/dL Final   BUN 14/78/2956 22  8 - 23 mg/dL Final   Creatinine, Ser 05/04/2023 1.97 (H)  0.61 - 1.24 mg/dL Final   Calcium  05/04/2023 9.2  8.9 - 10.3 mg/dL Final   GFR, Estimated 05/04/2023 37 (L)  >60 mL/min Final   Anion gap  05/04/2023 10  5 - 15 Final   WBC 05/04/2023 7.1  4.0 - 10.5 K/uL Final   RBC 05/04/2023 4.99  4.22 - 5.81 MIL/uL Final   Hemoglobin 05/04/2023 14.7  13.0 - 17.0 g/dL Final   HCT 21/30/8657 43.0  39.0 - 52.0 % Final   MCV 05/04/2023 86.2  80.0 - 100.0 fL Final   MCH 05/04/2023 29.5  26.0 - 34.0 pg Final   MCHC 05/04/2023 34.2  30.0 - 36.0 g/dL Final   RDW 84/69/6295 13.0  11.5 - 15.5 % Final   Platelets 05/04/2023 173  150 - 400 K/uL Final   nRBC 05/04/2023 0.0  0.0 - 0.2 % Final  Lab on 05/03/2023  Component Date Value Ref Range Status   CYSTATIN C 05/03/2023 1.29 (H)  0.52 - 1.20 mg/L Final   eGFR 05/03/2023 54 (L)  >=60 mL/min/1.73m2 Final   Sodium 05/03/2023 137  135 - 145 mEq/L Final   Potassium 05/03/2023 3.7  3.5 - 5.1 mEq/L Final   Chloride 05/03/2023 104  96 - 112 mEq/L Final   CO2 05/03/2023 24  19 - 32 mEq/L Final   Glucose, Bld 05/03/2023 101 (H)  70 - 99 mg/dL Final   BUN 28/41/3244 21  6 - 23 mg/dL Final   Creatinine, Ser 05/03/2023 1.41  0.40 - 1.50 mg/dL Final   GFR 01/28/7251 52.02 (L)  >60.00 mL/min Final   Calcium  05/03/2023 9.3  8.4 - 10.5 mg/dL Final   Microalb, Ur 66/44/0347 <0.7  mg/dL Final   Creatinine,U 42/59/5638 65.1  mg/dL Final   Microalb Creat Ratio 05/03/2023 Unable to calculate  0.0 - 30.0 mg/g Final  Office Visit on 04/26/2023  Component Date Value Ref Range Status   CYSTATIN C 04/26/2023 1.68 (H)  0.52 - 1.20 mg/L Final   eGFR 04/26/2023 38 (L)  >=60 mL/min/1.77m2 Final   Creatinine, Urine 04/26/2023 58  20 - 320 mg/dL Final   Protein/Creat Ratio 04/26/2023 155 (H)  25 - 148 mg/g creat Final   Protein/Creatinine Ratio 04/26/2023 0.155 (H)  0.025 - 0.148 mg/mg creat Final   Total Protein, Urine 04/26/2023 9  5 - 25 mg/dL Final   Sodium 75/64/3329 134 (L)  135 - 145 mEq/L Final   Potassium 04/26/2023 3.8  3.5 - 5.1 mEq/L Final   Chloride 04/26/2023 99  96 - 112 mEq/L Final   CO2 04/26/2023 26  19 - 32 mEq/L Final   Glucose, Bld 04/26/2023 108  (H)  70 - 99 mg/dL Final   BUN 63/87/5643 29 (H)  6 - 23 mg/dL Final   Creatinine, Ser 04/26/2023 1.76 (H)  0.40 - 1.50 mg/dL Final   GFR 32/95/1884 39.87 (L)  >60.00 mL/min Final   Calcium  04/26/2023 9.2  8.4 - 10.5 mg/dL Final  There may be more visits with results that are not included.  No image results found. US  RENAL ARTERY DUPLEX COMPLETE Result Date: 06/24/2023 CLINICAL DATA:  aki, ckd, intolerant of arb (raises creatinine) eval for renal artery stenosis. EXAM: RENAL/URINARY TRACT ULTRASOUND RENAL DUPLEX DOPPLER ULTRASOUND COMPARISON:  CT renal stone protocol 05/05/2023. Renal ultrasound 04/28/2023. FINDINGS: Right Kidney: Length: 9.1 cm. Increased echogenicity. No mass or hydronephrosis visualized. Simple cortical cysts measuring up to 2.8 cm. Left Kidney: Length: 12 cm. Increased echogenicity. No mass or hydronephrosis visualized. Simple cortical cysts measuring up to 4.2 cm. Bladder:  Unremarkable RENAL DUPLEX ULTRASOUND Right Renal Artery Velocities: Origin:  130 cm/sec Mid:  89 cm/sec Hilum:  120 cm/sec Interlobar:  62 cm/sec Arcuate:  33 cm/sec Left Renal Artery Velocities: Origin:  110 cm/sec Mid:  85 cm/sec Hilum:  69 cm/sec Interlobar:  35 cm/sec Arcuate:  26 cm/sec Aortic Velocity:  97 cm/sec Right Renal-Aortic Ratios: Origin: 1.3 Mid:  0.9 Hilum: 1.2 Interlobar: 0.6 Arcuate: 0.3 Left Renal-Aortic Ratios: Origin: 1.1 Mid: 0.9 Hilum: 0.7 Interlobar: 0.36 Arcuate: 0.27 IMPRESSION: 1. No Doppler evidence of renal artery stenosis. 2. Increased renal echogenicity consistent with medical renal disease. No hydronephrosis. Electronically Signed   By: Rox Cope M.D.   On: 06/24/2023 12:29   CT Renal Stone Study Result Date: 05/05/2023 CLINICAL DATA:  Intense flank pain EXAM: CT ABDOMEN AND PELVIS WITHOUT CONTRAST TECHNIQUE: Multidetector CT imaging of the abdomen and pelvis was performed following the standard protocol without IV contrast. RADIATION DOSE REDUCTION: This exam was performed  according to the departmental dose-optimization program which includes automated exposure control, adjustment of the mA and/or kV according to patient size and/or use of iterative reconstruction technique. COMPARISON:  Ultrasound 04/28/2023 FINDINGS: Lower chest: Lung bases demonstrate no acute airspace disease. Hepatobiliary: Gallstones. No biliary dilatation. No focal hepatic abnormality Pancreas: Unremarkable. No pancreatic ductal dilatation or surrounding inflammatory changes. Spleen: Normal in size without focal abnormality. Adrenals/Urinary Tract: Adrenal glands are within normal limits. No hydronephrosis. Multiple cysts. Punctate nonobstructing bilateral kidney stones. Bilateral renal cysts for which no imaging follow-up is recommended. The bladder is unremarkable. Stomach/Bowel: Stomach nonenlarged. No dilated small bowel. No acute bowel wall thickening. Moderate to stool burden Vascular/Lymphatic: Aortic atherosclerosis. No enlarged abdominal or pelvic lymph nodes. Reproductive: Prostate is unremarkable. Other: Negative for pelvic effusion or free air Musculoskeletal: Scoliosis and degenerative changes of the spine. No acute osseous abnormality IMPRESSION: 1. Negative for hydronephrosis or hydroureter. Punctate nonobstructing bilateral kidney stones. 2. Gallstones. 3. Aortic atherosclerosis. Electronically Signed   By: Esmeralda Hedge M.D.   On: 05/05/2023 00:31   US  Renal Result Date: 04/28/2023 CLINICAL DATA:  Chronic kidney disease. EXAM: RENAL / URINARY TRACT ULTRASOUND COMPLETE COMPARISON:  None Available. FINDINGS: Right Kidney: Renal measurements: 10 x 6 x 5.2 cm = volume: 162.9 mL. Mild increased echotexture. No hydronephrosis visualized. Simple cysts are noted, largest measures 2.3 x 2.3 x 2.7 cm, no follow-up is recommended. Left Kidney: Renal measurements: 11 x 6.4 x 6.8 cm = volume: 251.8 mL. Increased echotexture. No hydronephrosis visualized. Simple cysts are noted, largest measures 4.2 x 4  x  4.4 cm, no follow-up is recommended. Bladder: Appears normal for degree of bladder distention. Other: None. IMPRESSION: 1. No acute abnormality identified. 2. Increased echotexture of bilateral kidneys, consistent with medical renal disease. Electronically Signed   By: Anna Barnes M.D.   On: 04/28/2023 09:41        06/30/2023    8:48 AM 04/09/2023    8:47 AM 03/26/2023    8:36 AM 03/23/2022    9:53 AM  PHQ 2/9 Scores  PHQ - 2 Score 0 0 0 0   Results     Assessment & Plan Resistant hypertension Hypertension is well-controlled with current medication. Home blood pressure readings are consistently in the 110s and 120s systolic range, with olmesartan  effectively maintaining control. The goal is to keep blood pressure between 100 and 130 systolic to prevent further kidney damage. Doxazosin  will be discontinued due to the risk of low blood pressure during sleep, which could harm the kidneys. Optimal blood pressure control with minimal medication is the aim. Amlodipine  will be changed to once daily dosing. Blood pressure should be checked two to three times daily to monitor for variability, and any spikes should be reported. Chronic kidney disease (CKD), active medical management without dialysis, stage 3 (moderate) (HCC) Chronic kidney disease is at stage 3 with a GFR in the 40s, attributed to better blood pressure control rather than kidney damage. The focus is on preventing further decline by maintaining controlled blood pressure. Jardiance  will continue to protect kidney function. Hydration is crucial, with a recommended fluid intake of 2.5 liters per day. Weekly kidney function tests are ordered, and coordination with a kidney specialist for ongoing management is planned.  Hyperlipidemia, acquired Hyperlipidemia management includes a recent switch from lovastatin  to rosuvastatin . Initial side effects of lightheadedness and dizziness have resolved, and rosuvastatin  is well-tolerated without muscle  aches. The dose may be increased if tolerated. Monitoring for side effects will continue.  Goals of Care   The importance of recognizing stroke symptoms due to potential medication risks was discussed. Immediate action is crucial if symptoms such as garbled speech, numbness, or weakness occur. Education on recognizing these symptoms and the importance of calling 911 immediately was provided.  Follow-up   Regular monitoring of kidney function and blood pressure is planned, with coordination with the kidney specialist for ongoing management. Kidney function tests are scheduled weekly initially, with the possibility of extending to monthly or bi-monthly if stable. A follow-up with the kidney specialist is scheduled for next Friday.         Orders Placed in Encounter:    Imaging Orders  No imaging studies ordered today   Referral Orders  No referral(s) requested today   Meds ordered this encounter  Medications   DISCONTD: amLODipine  (NORVASC ) 5 MG tablet    Sig: Take 2 tablets (10 mg total) by mouth daily.    Dispense:  90 tablet    Refill:  3   amLODipine  (NORVASC ) 10 MG tablet    Sig: Take 1 tablet (10 mg total) by mouth daily.    Dispense:  90 tablet    Refill:  3    Orders Placed This Encounter  Procedures   Microalbumin / creatinine urine ratio    Share to nephrologistShare with Melodie Spry, MD Consulting Physician (Nephrology) Phone: [phone] Fax: [phone] [address] [place] [address] 07/09/2023 - -    Standing Status:   Standing    Number of Occurrences:   50  Next Expected Occurrence:   07/16/2023    Expiration Date:   07/08/2024   Basic Metabolic Panel (BMET)    Share with Melodie Spry, MD Consulting Physician (Nephrology) Phone: [phone] Fax: [phone] [address] [place] [address] 07/09/2023 - -    Standing Status:   Standing    Number of Occurrences:   52    Expiration Date:   07/08/2024        This document was synthesized by  artificial intelligence ([employer]) using HIPAA-compliant recording of the clinical interaction;   We discussed the use of AI scribe software for clinical note transcription with the patient, who gave verbal consent to proceed. additional Info: This encounter employed state-of-the-art, real-time, collaborative documentation. The patient actively reviewed and assisted in updating their electronic medical record on a shared screen, ensuring transparency and facilitating joint problem-solving for the problem list, overview, and plan. This approach promotes accurate, informed care. The treatment plan was discussed and reviewed in detail, including medication safety, potential side effects, and all patient questions. We confirmed understanding and comfort with the plan. Follow-up instructions were established, including contacting the office for any concerns, returning if symptoms worsen, persist, or new symptoms develop, and precautions for potential emergency department visits.

## 2023-07-09 NOTE — Assessment & Plan Note (Signed)
 Hypertension is well-controlled with current medication. Home blood pressure readings are consistently in the 110s and 120s systolic range, with olmesartan  effectively maintaining control. The goal is to keep blood pressure between 100 and 130 systolic to prevent further kidney damage. Doxazosin  will be discontinued due to the risk of low blood pressure during sleep, which could harm the kidneys. Optimal blood pressure control with minimal medication is the aim. Amlodipine  will be changed to once daily dosing. Blood pressure should be checked two to three times daily to monitor for variability, and any spikes should be reported.

## 2023-07-09 NOTE — Patient Instructions (Signed)
 VISIT SUMMARY:  Today, we discussed your blood pressure management and medication adjustments. Your blood pressure readings have improved since switching to olmesartan . We also reviewed your kidney function and recent changes in your medication regimen. You are tolerating your new medication, Crestor , well. We discussed the importance of maintaining controlled blood pressure to protect your kidneys and the need for regular monitoring.  YOUR PLAN:  -HYPERTENSION: Hypertension, or high blood pressure, is well-controlled with your current medication. We aim to keep your blood pressure between 100 and 130 systolic to prevent further kidney damage. We will discontinue doxazosin  to avoid low blood pressure during sleep and change amlodipine  to once daily. Please check your blood pressure two to three times daily and report any spikes.  -CHRONIC KIDNEY DISEASE: Chronic kidney disease is a condition where the kidneys gradually lose function. Your kidney function is stable at stage 3, with a GFR in the 40s. We will continue Jardiance  to protect your kidneys and recommend drinking 2.5 liters of water daily. Weekly kidney function tests are ordered, and we will coordinate with a kidney specialist for ongoing management.  -HYPERLIPIDEMIA: Hyperlipidemia, or high cholesterol, is being managed with rosuvastatin  (Crestor ). You initially experienced lightheadedness and dizziness, but these symptoms have resolved. We will monitor for any side effects and may increase the dose if tolerated.  -GOALS OF CARE: We discussed the importance of recognizing stroke symptoms, such as garbled speech, numbness, or weakness, due to potential medication risks. Immediate action is crucial if these symptoms occur, and you should call 911 immediately.  INSTRUCTIONS:  Please continue to monitor your blood pressure two to three times daily and report any significant changes. Drink 2.5 liters of water daily to stay hydrated. Weekly  kidney function tests are scheduled, and you have a follow-up appointment with the kidney specialist next Friday. If you experience any stroke symptoms, call 911 immediately.

## 2023-07-11 ENCOUNTER — Ambulatory Visit: Payer: Self-pay | Admitting: Internal Medicine

## 2023-07-16 DIAGNOSIS — N1832 Chronic kidney disease, stage 3b: Secondary | ICD-10-CM | POA: Diagnosis not present

## 2023-07-16 DIAGNOSIS — N2581 Secondary hyperparathyroidism of renal origin: Secondary | ICD-10-CM | POA: Diagnosis not present

## 2023-07-16 DIAGNOSIS — D631 Anemia in chronic kidney disease: Secondary | ICD-10-CM | POA: Diagnosis not present

## 2023-07-16 DIAGNOSIS — I129 Hypertensive chronic kidney disease with stage 1 through stage 4 chronic kidney disease, or unspecified chronic kidney disease: Secondary | ICD-10-CM | POA: Diagnosis not present

## 2023-07-23 ENCOUNTER — Other Ambulatory Visit (INDEPENDENT_AMBULATORY_CARE_PROVIDER_SITE_OTHER)

## 2023-07-23 ENCOUNTER — Telehealth: Payer: Self-pay | Admitting: Internal Medicine

## 2023-07-23 DIAGNOSIS — I1A0 Resistant hypertension: Secondary | ICD-10-CM

## 2023-07-23 DIAGNOSIS — N183 Chronic kidney disease, stage 3 unspecified: Secondary | ICD-10-CM | POA: Diagnosis not present

## 2023-07-23 LAB — BASIC METABOLIC PANEL WITH GFR
BUN: 41 mg/dL — ABNORMAL HIGH (ref 6–23)
CO2: 22 meq/L (ref 19–32)
Calcium: 9.3 mg/dL (ref 8.4–10.5)
Chloride: 105 meq/L (ref 96–112)
Creatinine, Ser: 2.06 mg/dL — ABNORMAL HIGH (ref 0.40–1.50)
GFR: 32.95 mL/min — ABNORMAL LOW (ref >60.00)
Glucose, Bld: 102 mg/dL — ABNORMAL HIGH (ref 70–99)
Potassium: 5.2 meq/L — ABNORMAL HIGH (ref 3.5–5.1)
Sodium: 133 meq/L — ABNORMAL LOW (ref 135–145)

## 2023-07-23 LAB — MICROALBUMIN / CREATININE URINE RATIO
Creatinine,U: 89.3 mg/dL
Microalb Creat Ratio: 22.4 mg/g (ref 0.0–30.0)
Microalb, Ur: 2 mg/dL — ABNORMAL HIGH (ref 0.0–1.9)

## 2023-07-23 NOTE — Telephone Encounter (Signed)
 Patient came in for lab work today 06/27 but states he will need to return for additional blood work next week  . No future orders have been placed , please advise if  we can schedule for lab visit next week .

## 2023-07-23 NOTE — Telephone Encounter (Signed)
 Please disregard . Labs have been ordered

## 2023-07-25 ENCOUNTER — Ambulatory Visit: Payer: Self-pay | Admitting: Internal Medicine

## 2023-07-25 ENCOUNTER — Other Ambulatory Visit: Payer: Self-pay | Admitting: Internal Medicine

## 2023-07-25 DIAGNOSIS — N183 Chronic kidney disease, stage 3 unspecified: Secondary | ICD-10-CM

## 2023-07-25 NOTE — Progress Notes (Signed)
 I created a note with instructions to change medications that aborted given the patient by phone I just want to make sure that his labs get to his kidney doctor

## 2023-07-25 NOTE — Progress Notes (Signed)
 Lab Results  Component Value Date   GFR 32.95 (L) 07/23/2023   GFR 50.23 (L) 07/09/2023   GFR 50.24 (L) 06/30/2023   GFR 40.95 (L) 06/07/2023   GFR 46.77 (L) 05/28/2023   EGFR 43 (L) 06/30/2023   EGFR 51.0 06/20/2023   EGFR 45 (L) 06/07/2023   EGFR 42 (L) 05/28/2023   EGFR 47 (L) 05/18/2023   Called Advised patient drink fluid Go to ER if not making urine hold Jardiance  hold olmesartan  restart doxazosin  recheck labs on Thursday. Sent text reminder on instructions. Patient verbalized understanding.

## 2023-07-27 ENCOUNTER — Telehealth: Payer: Self-pay

## 2023-07-27 NOTE — Telephone Encounter (Signed)
 Faxed over to Dr Tobie labs.  Copied from CRM [id number]. Topic: General - Other >> Jul 26, 2023  2:50 PM Eric Crane wrote: Reason for CRM: Patient would like to know if he could have lab results and any notes attached to them from 6 /27/2025 sent to [facility]? Fax#: [phone] Attn:Dr Tobie

## 2023-07-29 ENCOUNTER — Other Ambulatory Visit (INDEPENDENT_AMBULATORY_CARE_PROVIDER_SITE_OTHER)

## 2023-07-29 ENCOUNTER — Other Ambulatory Visit

## 2023-07-29 DIAGNOSIS — N179 Acute kidney failure, unspecified: Secondary | ICD-10-CM

## 2023-07-29 DIAGNOSIS — I1A0 Resistant hypertension: Secondary | ICD-10-CM | POA: Diagnosis not present

## 2023-07-29 DIAGNOSIS — N183 Chronic kidney disease, stage 3 unspecified: Secondary | ICD-10-CM

## 2023-07-29 DIAGNOSIS — S37009D Unspecified injury of unspecified kidney, subsequent encounter: Secondary | ICD-10-CM

## 2023-07-29 DIAGNOSIS — R7303 Prediabetes: Secondary | ICD-10-CM

## 2023-07-29 DIAGNOSIS — N200 Calculus of kidney: Secondary | ICD-10-CM | POA: Diagnosis not present

## 2023-07-29 LAB — MICROALBUMIN / CREATININE URINE RATIO
Creatinine,U: 106 mg/dL
Microalb Creat Ratio: 11.3 mg/g (ref 0.0–30.0)
Microalb, Ur: 1.2 mg/dL (ref 0.0–1.9)

## 2023-07-29 LAB — BASIC METABOLIC PANEL WITH GFR
BUN: 25 mg/dL — ABNORMAL HIGH (ref 6–23)
CO2: 27 meq/L (ref 19–32)
Calcium: 9.4 mg/dL (ref 8.4–10.5)
Chloride: 106 meq/L (ref 96–112)
Creatinine, Ser: 1.52 mg/dL — ABNORMAL HIGH (ref 0.40–1.50)
GFR: 47.45 mL/min — ABNORMAL LOW (ref >60.00)
Glucose, Bld: 130 mg/dL — ABNORMAL HIGH (ref 70–99)
Potassium: 4.9 meq/L (ref 3.5–5.1)
Sodium: 139 meq/L (ref 135–145)

## 2023-07-30 ENCOUNTER — Encounter: Payer: Self-pay | Admitting: Internal Medicine

## 2023-07-30 LAB — PROTEIN / CREATININE RATIO, URINE
Creatinine, Urine: 106 mg/dL (ref 20–320)
Protein/Creat Ratio: 170 mg/g{creat} — ABNORMAL HIGH (ref 25–148)
Protein/Creatinine Ratio: 0.17 mg/mg{creat} — ABNORMAL HIGH (ref 0.025–0.148)
Total Protein, Urine: 18 mg/dL (ref 5–25)

## 2023-07-31 ENCOUNTER — Ambulatory Visit: Payer: Self-pay | Admitting: Internal Medicine

## 2023-07-31 LAB — CYSTATIN C WITH GLOMERULAR FILTRATION RATE, ESTIMATED (EGFR)
CYSTATIN C: 1.45 mg/L — ABNORMAL HIGH (ref 0.52–1.20)
eGFR: 46 mL/min/1.73m2 — ABNORMAL LOW (ref >=60)

## 2023-08-02 ENCOUNTER — Other Ambulatory Visit

## 2023-08-02 NOTE — Telephone Encounter (Signed)
 read by Kayla Schmitt at 2:44PM on 07/31/2023.

## 2023-08-05 ENCOUNTER — Ambulatory Visit: Payer: Self-pay | Admitting: Internal Medicine

## 2023-08-05 ENCOUNTER — Other Ambulatory Visit (INDEPENDENT_AMBULATORY_CARE_PROVIDER_SITE_OTHER)

## 2023-08-05 DIAGNOSIS — N183 Chronic kidney disease, stage 3 unspecified: Secondary | ICD-10-CM | POA: Diagnosis not present

## 2023-08-05 DIAGNOSIS — I1A0 Resistant hypertension: Secondary | ICD-10-CM | POA: Diagnosis not present

## 2023-08-05 LAB — MICROALBUMIN / CREATININE URINE RATIO
Creatinine,U: 121 mg/dL
Microalb Creat Ratio: 17.2 mg/g (ref 0.0–30.0)
Microalb, Ur: 2.1 mg/dL — ABNORMAL HIGH (ref 0.0–1.9)

## 2023-08-05 LAB — BASIC METABOLIC PANEL WITH GFR
BUN: 22 mg/dL (ref 6–23)
CO2: 24 meq/L (ref 19–32)
Calcium: 9.1 mg/dL (ref 8.4–10.5)
Chloride: 104 meq/L (ref 96–112)
Creatinine, Ser: 1.41 mg/dL (ref 0.40–1.50)
GFR: 51.92 mL/min — ABNORMAL LOW (ref >60.00)
Glucose, Bld: 143 mg/dL — ABNORMAL HIGH (ref 70–99)
Potassium: 4.4 meq/L (ref 3.5–5.1)
Sodium: 134 meq/L — ABNORMAL LOW (ref 135–145)

## 2023-08-05 NOTE — Progress Notes (Signed)
 Kidneys back to baseline.  I'd like you to try just 5 mg olmesartan  and never go up again, just enough that it protects your kidney and blood pressure, without causing your creatinine to spike again.  We can wait until appointment to discuss or you can go ahead.

## 2023-08-11 ENCOUNTER — Encounter: Payer: Self-pay | Admitting: Internal Medicine

## 2023-08-11 ENCOUNTER — Ambulatory Visit (INDEPENDENT_AMBULATORY_CARE_PROVIDER_SITE_OTHER): Admitting: Internal Medicine

## 2023-08-11 VITALS — BP 130/70 | HR 74 | Temp 98.0°F | Ht 67.0 in | Wt 169.2 lb

## 2023-08-11 DIAGNOSIS — I1A0 Resistant hypertension: Secondary | ICD-10-CM

## 2023-08-11 DIAGNOSIS — N183 Chronic kidney disease, stage 3 unspecified: Secondary | ICD-10-CM

## 2023-08-11 DIAGNOSIS — N179 Acute kidney failure, unspecified: Secondary | ICD-10-CM | POA: Diagnosis not present

## 2023-08-11 LAB — BASIC METABOLIC PANEL WITH GFR
BUN: 24 mg/dL — ABNORMAL HIGH (ref 6–23)
CO2: 23 meq/L (ref 19–32)
Calcium: 9.4 mg/dL (ref 8.4–10.5)
Chloride: 105 meq/L (ref 96–112)
Creatinine, Ser: 1.4 mg/dL (ref 0.40–1.50)
GFR: 52.36 mL/min — ABNORMAL LOW (ref >60.00)
Glucose, Bld: 101 mg/dL — ABNORMAL HIGH (ref 70–99)
Potassium: 4.3 meq/L (ref 3.5–5.1)
Sodium: 137 meq/L (ref 135–145)

## 2023-08-11 MED ORDER — SPIRONOLACTONE-HCTZ 25-25 MG PO TABS: 1.0000 | ORAL_TABLET | Freq: Every day | ORAL | 1 refills | Status: DC

## 2023-08-11 NOTE — Assessment & Plan Note (Signed)
 Chronic kidney disease is well-managed with no microalbuminuria. Previous high doses of olmesartan  caused acute kidney injury, resolved upon discontinuation. Current kidney function is closely monitored with olmesartan  5 mg. Jardiance  may be resumed if kidney function remains stable without proteinuria, as it aids in kidney protection. Monitor kidney function with blood and urine tests. Resume Jardiance  if kidney function remains stable without proteinuria.

## 2023-08-11 NOTE — Assessment & Plan Note (Signed)
 Hypertension remains uncontrolled with blood pressures in the mid-140s. Previous olmesartan  at 20 mg caused significant kidney function decline, leading to its discontinuation. Currently, olmesartan  at 5 mg is monitored for kidney tolerance. Amlodipine  and doxazosin  are inadequate. Beta blockers are avoided due to interaction with Adderall and potential side effects like reduced exercise tolerance. Diuretics, including hydrochlorothiazide , are avoided due to kidney concerns. The goal is a blood pressure of 130 to prevent arterial damage. A combination of spironolactone  and hydrochlorothiazide  is considered to achieve a 15-point drop in blood pressure. Order blood and urine tests to monitor kidney function and proteinuria. Continue olmesartan  5 mg and monitor kidney tolerance. Prescribe a combination of spironolactone  and hydrochlorothiazide  at a quarter dose and monitor response. Avoid beta blockers. Consider mineralocorticoid receptor antagonists if the current plan is ineffective.

## 2023-08-11 NOTE — Progress Notes (Signed)
 ==============================  [facility]: [phone]   -- Medical Office Visit --  Patient: Eric Crane      Age: 68 y.o.       Sex:  male  Date:   08/11/2023 Today's Healthcare Provider: Bernardino KANDICE Cone, MD  ==============================   Chief Complaint: Chronic Kidney Disease and Hypertension (Pt states his bp has been up again 140/80 range now )   Discussed the use of AI scribe software for clinical note transcription with the patient, who gave verbal consent to proceed.  History of Present Illness  68 year old male with hypertension and chronic kidney disease who presents for management of high blood pressure.  He has a history of hypertension with recent blood pressure readings ranging from 140/79 to 153/86. His blood pressure temporarily decreased to 127/72 after restarting olmesartan , but subsequently increased again. He is currently taking olmesartan  5 mg, amlodipine , and doxazosin  for blood pressure management. Previously, he took olmesartan  20 mg, which led to elevated creatinine levels and microalbuminuria, prompting discontinuation. No symptoms related to hypertension, describing it as a 'silent killer.'  He has chronic kidney disease, with previous episodes of acute kidney injury associated with higher doses of olmesartan . Kidney function improved after discontinuing olmesartan  20 mg, and he has not had blood work since restarting olmesartan  5 mg. He was previously on Jardiance , which was stopped due to potential kidney strain.  He is currently taking Crestor  for high cholesterol, prescribed by his cardiologist. He has a non-zero coronary calcium  score and is considered average for his age in terms of heart disease risk. He has tolerated Crestor  well and is scheduled for follow-up blood work in September.  He is actively managing his hydration levels and reports feeling well hydrated, with clear urine. He is mindful of his  fluid intake and its impact on kidney health.  He has a history of depression, previously treated with antidepressant medication due to feelings of lethargy. He is concerned about potential side effects of medications, particularly those that may impact his daily life and physical activity, such as beta blockers.  Background Reviewed: Problem List: has Primary osteoarthritis of right knee; Weight disorder; FH: heart disease; Osteoarthritis of lower back; Hyperlipidemia, acquired; Thrombocytopenia (HCC); Attention deficit hyperactivity disorder (ADHD); Bipolar 2 disorder (HCC); H/O total knee replacement; Prediabetes; Statin intolerance; Status post total right knee replacement; Pain in left hip; Medication management; Chronic kidney disease (CKD), active medical management without dialysis, stage 3 (moderate) (HCC); Hypertension; Coronary artery calcification; Elevated serum creatinine; Nephrolithiasis; Gallstones; Injury to kidney; Elevated PSA; and Polycystic kidney disease on their problem list. Past Medical History:  has a past medical history of Arthritis (2021), Bipolar 2 disorder (HCC) (11/20/2021), Chronic kidney disease (03/26/2023), Depression, FH: heart disease (11/20/2021), Hypertension (03/25/2024), Osteoarthritis of lower back (11/20/2021), Overweight (11/20/2021), and Thrombocytopenia (HCC) (11/20/2021). Past Surgical History:   has a past surgical history that includes Colonoscopy with propofol  (N/A, 02/18/2016); Tonsillectomy; and Total knee arthroplasty (Right, 02/02/2022). Social History:   reports that he has never smoked. He has never used smokeless tobacco. He reports that he does not drink alcohol and does not use drugs. Family History:  family history includes Heart disease in his mother. Allergies:  has no known allergies.   Medication Reconciliation: Current Outpatient Medications on File Prior to Visit  Medication Sig   amLODipine  (NORVASC ) 10 MG tablet Take 1 tablet (10 mg  total) by mouth daily.   amphetamine-dextroamphetamine (ADDERALL) 20 MG tablet Take 20 mg  by mouth 3 (three) times daily.   aspirin  EC 81 MG tablet Take 1 tablet (81 mg total) by mouth daily. Swallow whole.   doxazosin  (CARDURA ) 1 MG tablet Take 1 mg by mouth daily.   lamoTRIgine  (LAMICTAL ) 200 MG tablet Take 200 mg by mouth daily.   QUEtiapine  Fumarate (SEROQUEL  XR) 150 MG 24 hr tablet Take 300 mg by mouth at bedtime.   rosuvastatin  (CRESTOR ) 20 MG tablet Take 1 tablet (20 mg total) by mouth daily.   empagliflozin  (JARDIANCE ) 25 MG TABS tablet Take 1 tablet (25 mg total) by mouth daily before breakfast. (Patient not taking: Reported on 08/11/2023)   No current facility-administered medications on file prior to visit.   Medications Discontinued During This Encounter  Medication Reason   spironolactone -hydrochlorothiazide  (ALDACTAZIDE) 25-25 MG tablet      Physical Exam:    08/11/2023    7:57 AM 07/09/2023    7:47 AM 06/30/2023    8:41 AM  Vitals with BMI  Height 5' 7 5' 7 5' 7  Weight 169 lbs 3 oz  168 lbs 13 oz  BMI 26.49  26.43  Systolic 130 110 893  Diastolic 70 58 60  Pulse 74 82 52  Vital signs reviewed.  Nursing notes reviewed. Weight trend reviewed. Physical Exam General Appearance:  No acute distress appreciable.   Well-groomed, healthy-appearing male.  Well proportioned with no abnormal fat distribution.  Good muscle tone. Pulmonary:  Normal work of breathing at rest, no respiratory distress apparent. SpO2: 98 %  Musculoskeletal: All extremities are intact.  Neurological:  Awake, alert, oriented, and engaged.  No obvious focal neurological deficits or cognitive impairments.  Sensorium seems unclouded.   Speech is clear and coherent with logical content. Psychiatric:  Appropriate mood, pleasant and cooperative demeanor, thoughtful and engaged during the exam  Results:    08/11/2023    8:04 AM 06/30/2023    8:48 AM 04/09/2023    8:47 AM 03/26/2023    8:36 AM  PHQ 2/9  Scores  PHQ - 2 Score 0 0 0 0   Results LABS Creatinine: >2 mg/dL Microalbuminuria: detected  RADIOLOGY CT Coronary Calcium  Score: Moderate coronary artery calcification, average for a 68 year old, indicating mild plaque presence    Results for orders placed or performed in visit on 08/11/23  Basic metabolic panel with GFR  Result Value Ref Range   Sodium 137 135 - 145 mEq/L   Potassium 4.3 3.5 - 5.1 mEq/L   Chloride 105 96 - 112 mEq/L   CO2 23 19 - 32 mEq/L   Glucose, Bld 101 (H) 70 - 99 mg/dL   BUN 24 (H) 6 - 23 mg/dL   Creatinine, Ser 8.59 0.40 - 1.50 mg/dL   GFR 47.63 (L) >39.99 mL/min   Calcium  9.4 8.4 - 10.5 mg/dL   Office Visit on 92/83/7974  Component Date Value Ref Range Status   Sodium 08/11/2023 137  135 - 145 mEq/L Final   Potassium 08/11/2023 4.3  3.5 - 5.1 mEq/L Final   Chloride 08/11/2023 105  96 - 112 mEq/L Final   CO2 08/11/2023 23  19 - 32 mEq/L Final   Glucose, Bld 08/11/2023 101 (H)  70 - 99 mg/dL Final   BUN 92/83/7974 24 (H)  6 - 23 mg/dL Final   Creatinine, Ser 08/11/2023 1.40  0.40 - 1.50 mg/dL Final   GFR 92/83/7974 52.36 (L)  >60.00 mL/min Final   Calcium  08/11/2023 9.4  8.4 - 10.5 mg/dL Final  Lab on 92/89/7974  Component Date Value Ref Range Status   Sodium 08/05/2023 134 (L)  135 - 145 mEq/L Final   Potassium 08/05/2023 4.4  3.5 - 5.1 mEq/L Final   Chloride 08/05/2023 104  96 - 112 mEq/L Final   CO2 08/05/2023 24  19 - 32 mEq/L Final   Glucose, Bld 08/05/2023 143 (H)  70 - 99 mg/dL Final   BUN 92/89/7974 22  6 - 23 mg/dL Final   Creatinine, Ser 08/05/2023 1.41  0.40 - 1.50 mg/dL Final   GFR 92/89/7974 51.92 (L)  >60.00 mL/min Final   Calcium  08/05/2023 9.1  8.4 - 10.5 mg/dL Final   Microalb, Ur 92/89/7974 2.1 (H)  0.0 - 1.9 mg/dL Final   Creatinine,U 92/89/7974 121.0  mg/dL Final   Microalb Creat Ratio 08/05/2023 17.2  0.0 - 30.0 mg/g Final  Appointment on 07/29/2023  Component Date Value Ref Range Status   Microalb, Ur 07/29/2023 1.2   0.0 - 1.9 mg/dL Final   Creatinine,U 92/96/7974 106.0  mg/dL Final   Microalb Creat Ratio 07/29/2023 11.3  0.0 - 30.0 mg/g Final   Sodium 07/29/2023 139  135 - 145 mEq/L Final   Potassium 07/29/2023 4.9  3.5 - 5.1 mEq/L Final   Chloride 07/29/2023 106  96 - 112 mEq/L Final   CO2 07/29/2023 27  19 - 32 mEq/L Final   Glucose, Bld 07/29/2023 130 (H)  70 - 99 mg/dL Final   BUN 92/96/7974 25 (H)  6 - 23 mg/dL Final   Creatinine, Ser 07/29/2023 1.52 (H)  0.40 - 1.50 mg/dL Final   GFR 92/96/7974 47.45 (L)  >60.00 mL/min Final   Calcium  07/29/2023 9.4  8.4 - 10.5 mg/dL Final   CYSTATIN C 92/96/7974 1.45 (H)  0.52 - 1.20 mg/L Final   eGFR 07/29/2023 46 (L)  >=60 mL/min/1.94m2 Final   Creatinine, Urine 07/29/2023 106  20 - 320 mg/dL Final   Protein/Creat Ratio 07/29/2023 170 (H)  25 - 148 mg/g creat Final   Protein/Creatinine Ratio 07/29/2023 0.170 (H)  0.025 - 0.148 mg/mg creat Final   Total Protein, Urine 07/29/2023 18  5 - 25 mg/dL Final  Lab on 93/72/7974  Component Date Value Ref Range Status   Sodium 07/23/2023 133 (L)  135 - 145 mEq/L Final   Potassium 07/23/2023 5.2 No hemolysis seen (H)  3.5 - 5.1 mEq/L Final   Chloride 07/23/2023 105  96 - 112 mEq/L Final   CO2 07/23/2023 22  19 - 32 mEq/L Final   Glucose, Bld 07/23/2023 102 (H)  70 - 99 mg/dL Final   BUN 93/72/7974 41 (H)  6 - 23 mg/dL Final   Creatinine, Ser 07/23/2023 2.06 (H)  0.40 - 1.50 mg/dL Final   GFR 93/72/7974 32.95 (L)  >60.00 mL/min Final   Calcium  07/23/2023 9.3  8.4 - 10.5 mg/dL Final   Microalb, Ur 93/72/7974 2.0 (H)  0.0 - 1.9 mg/dL Final   Creatinine,U 93/72/7974 89.3  mg/dL Final   Microalb Creat Ratio 07/23/2023 22.4  0.0 - 30.0 mg/g Final  Office Visit on 07/09/2023  Component Date Value Ref Range Status   Sodium 07/09/2023 138  135 - 145 mEq/L Final   Potassium 07/09/2023 4.1  3.5 - 5.1 mEq/L Final   Chloride 07/09/2023 105  96 - 112 mEq/L Final   CO2 07/09/2023 24  19 - 32 mEq/L Final   Glucose, Bld  07/09/2023 95  70 - 99 mg/dL Final   BUN 93/86/7974 24 (H)  6 - 23 mg/dL Final  Creatinine, Ser 07/09/2023 1.45  0.40 - 1.50 mg/dL Final   GFR 93/86/7974 50.23 (L)  >60.00 mL/min Final   Calcium  07/09/2023 9.0  8.4 - 10.5 mg/dL Final   Microalb, Ur 93/86/7974 <0.7  mg/dL Final   Creatinine,U 93/86/7974 56.7  mg/dL Final   Microalb Creat Ratio 07/09/2023 Unable to calculate  0.0 - 30.0 mg/g Final  Lab on 06/30/2023  Component Date Value Ref Range Status   CYSTATIN C 06/30/2023 1.54 (H)  0.52 - 1.20 mg/L Final   eGFR 06/30/2023 43 (L)  >=60 mL/min/1.63m2 Final   Microalb, Ur 06/30/2023 <0.7  mg/dL Final   Creatinine,U 93/95/7974 48.8  mg/dL Final   Microalb Creat Ratio 06/30/2023 Unable to calculate  0.0 - 30.0 mg/g Final   Sodium 06/30/2023 138  135 - 145 mEq/L Final   Potassium 06/30/2023 4.4  3.5 - 5.1 mEq/L Final   Chloride 06/30/2023 107  96 - 112 mEq/L Final   CO2 06/30/2023 25  19 - 32 mEq/L Final   Glucose, Bld 06/30/2023 99  70 - 99 mg/dL Final   BUN 93/95/7974 25 (H)  6 - 23 mg/dL Final   Creatinine, Ser 06/30/2023 1.45  0.40 - 1.50 mg/dL Final   GFR 93/95/7974 50.24 (L)  >60.00 mL/min Final   Calcium  06/30/2023 9.3  8.4 - 10.5 mg/dL Final  Scanned Document on 06/22/2023  Component Date Value Ref Range Status   EGFR 06/20/2023 51.0   Final   Creatinine, POC 06/20/2023 75.4  mg/dL Final   Albumin, Urine POC 06/20/2023 5.3   Final   Microalb Creat Ratio 06/20/2023 7   Final  Office Visit on 06/07/2023  Component Date Value Ref Range Status   Sodium 06/07/2023 135  135 - 145 mEq/L Final   Potassium 06/07/2023 4.7  3.5 - 5.1 mEq/L Final   Chloride 06/07/2023 103  96 - 112 mEq/L Final   CO2 06/07/2023 23  19 - 32 mEq/L Final   Glucose, Bld 06/07/2023 92  70 - 99 mg/dL Final   BUN 94/87/7974 28 (H)  6 - 23 mg/dL Final   Creatinine, Ser 06/07/2023 1.72 (H)  0.40 - 1.50 mg/dL Final   GFR 94/87/7974 40.95 (L)  >60.00 mL/min Final   Calcium  06/07/2023 9.2  8.4 - 10.5 mg/dL  Final   CYSTATIN C 94/87/7974 1.48 (H)  0.52 - 1.20 mg/L Final   eGFR 06/07/2023 45 (L)  >=60 mL/min/1.61m2 Final   Microalb, Ur 06/07/2023 <0.7  mg/dL Final   Creatinine,U 94/87/7974 28.9  mg/dL Final   Microalb Creat Ratio 06/07/2023 Unable to calculate  0.0 - 30.0 mg/g Final  Office Visit on 05/28/2023  Component Date Value Ref Range Status   Sodium 05/28/2023 136  135 - 145 mEq/L Final   Potassium 05/28/2023 4.7  3.5 - 5.1 mEq/L Final   Chloride 05/28/2023 103  96 - 112 mEq/L Final   CO2 05/28/2023 25  19 - 32 mEq/L Final   Glucose, Bld 05/28/2023 117 (H)  70 - 99 mg/dL Final   BUN 94/97/7974 24 (H)  6 - 23 mg/dL Final   Creatinine, Ser 05/28/2023 1.54 (H)  0.40 - 1.50 mg/dL Final   GFR 94/97/7974 46.77 (L)  >60.00 mL/min Final   Calcium  05/28/2023 9.2  8.4 - 10.5 mg/dL Final   CYSTATIN C 94/97/7974 1.56 (H)  0.52 - 1.20 mg/L Final   eGFR 05/28/2023 42 (L)  >=60 mL/min/1.78m2 Final   Color, Urine 05/28/2023 YELLOW  Yellow;Lt. Yellow;Straw;Dark Yellow;Amber;Green;Red;Brown Final  APPearance 05/28/2023 CLEAR  Clear;Turbid;Slightly Cloudy;Cloudy Final   Specific Gravity, Urine 05/28/2023 1.010  1.000 - 1.030 Final   pH 05/28/2023 6.0  5.0 - 8.0 Final   Total Protein, Urine 05/28/2023 NEGATIVE  Negative Final   Urine Glucose 05/28/2023 >=1000 (A)  Negative Final   Ketones, ur 05/28/2023 NEGATIVE  Negative Final   Bilirubin Urine 05/28/2023 NEGATIVE  Negative Final   Hgb urine dipstick 05/28/2023 NEGATIVE  Negative Final   Urobilinogen, UA 05/28/2023 0.2  0.0 - 1.0 Final   Leukocytes,Ua 05/28/2023 NEGATIVE  Negative Final   Nitrite 05/28/2023 NEGATIVE  Negative Final   WBC, UA 05/28/2023 0-2/hpf  0-2/hpf Final   RBC / HPF 05/28/2023 0-2/hpf  0-2/hpf Final  Office Visit on 05/17/2023  Component Date Value Ref Range Status   Sodium 05/17/2023 135  135 - 145 mEq/L Final   Potassium 05/17/2023 4.3  3.5 - 5.1 mEq/L Final   Chloride 05/17/2023 102  96 - 112 mEq/L Final   CO2  05/17/2023 22  19 - 32 mEq/L Final   Glucose, Bld 05/17/2023 96  70 - 99 mg/dL Final   BUN 95/78/7974 25 (H)  6 - 23 mg/dL Final   Creatinine, Ser 05/17/2023 1.43  0.40 - 1.50 mg/dL Final   GFR 95/78/7974 51.13 (L)  >60.00 mL/min Final   Calcium  05/17/2023 9.5  8.4 - 10.5 mg/dL Final   Microalb, Ur 95/78/7974 <0.7  mg/dL Final   Creatinine,U 95/78/7974 19.0  mg/dL Final   Microalb Creat Ratio 05/17/2023 Unable to calculate  0.0 - 30.0 mg/g Final   CYSTATIN C 05/18/2023 1.44 (H)  0.52 - 1.20 mg/L Final   eGFR 05/18/2023 47 (L)  >=60 mL/min/1.57m2 Final  There may be more visits with results that are not included.  No image results found. US  RENAL ARTERY DUPLEX COMPLETE Result Date: 06/24/2023 CLINICAL DATA:  aki, ckd, intolerant of arb (raises creatinine) eval for renal artery stenosis. EXAM: RENAL/URINARY TRACT ULTRASOUND RENAL DUPLEX DOPPLER ULTRASOUND COMPARISON:  CT renal stone protocol 05/05/2023. Renal ultrasound 04/28/2023. FINDINGS: Right Kidney: Length: 9.1 cm. Increased echogenicity. No mass or hydronephrosis visualized. Simple cortical cysts measuring up to 2.8 cm. Left Kidney: Length: 12 cm. Increased echogenicity. No mass or hydronephrosis visualized. Simple cortical cysts measuring up to 4.2 cm. Bladder:  Unremarkable RENAL DUPLEX ULTRASOUND Right Renal Artery Velocities: Origin:  130 cm/sec Mid:  89 cm/sec Hilum:  120 cm/sec Interlobar:  62 cm/sec Arcuate:  33 cm/sec Left Renal Artery Velocities: Origin:  110 cm/sec Mid:  85 cm/sec Hilum:  69 cm/sec Interlobar:  35 cm/sec Arcuate:  26 cm/sec Aortic Velocity:  97 cm/sec Right Renal-Aortic Ratios: Origin: 1.3 Mid:  0.9 Hilum: 1.2 Interlobar: 0.6 Arcuate: 0.3 Left Renal-Aortic Ratios: Origin: 1.1 Mid: 0.9 Hilum: 0.7 Interlobar: 0.36 Arcuate: 0.27 IMPRESSION: 1. No Doppler evidence of renal artery stenosis. 2. Increased renal echogenicity consistent with medical renal disease. No hydronephrosis. Electronically Signed   By: Gwynneth Akin  M.D.   On: 06/24/2023 12:29         ASSESSMENT & PLAN   Assessment & Plan AKI (acute kidney injury) (HCC) Recent due to olmesartan , which resolved with discontinuation but I asked that he resume at 5 mg dose for renoprotection so we need recheck. Resistant hypertension Hypertension remains uncontrolled with blood pressures in the mid-140s. Previous olmesartan  at 20 mg caused significant kidney function decline, leading to its discontinuation. Currently, olmesartan  at 5 mg is monitored for kidney tolerance. Amlodipine  and doxazosin  are inadequate. Beta blockers are  avoided due to interaction with Adderall and potential side effects like reduced exercise tolerance. Diuretics, including hydrochlorothiazide , are avoided due to kidney concerns. The goal is a blood pressure of 130 to prevent arterial damage. A combination of spironolactone  and hydrochlorothiazide  is considered to achieve a 15-point drop in blood pressure. Order blood and urine tests to monitor kidney function and proteinuria. Continue olmesartan  5 mg and monitor kidney tolerance. Prescribe a combination of spironolactone  and hydrochlorothiazide  at a quarter dose and monitor response. Avoid beta blockers. Consider mineralocorticoid receptor antagonists if the current plan is ineffective. Chronic kidney disease (CKD), active medical management without dialysis, stage 3 (moderate) (HCC) Chronic kidney disease is well-managed with no microalbuminuria. Previous high doses of olmesartan  caused acute kidney injury, resolved upon discontinuation. Current kidney function is closely monitored with olmesartan  5 mg. Jardiance  may be resumed if kidney function remains stable without proteinuria, as it aids in kidney protection. Monitor kidney function with blood and urine tests. Resume Jardiance  if kidney function remains stable without proteinuria.    ORDER ASSOCIATIONS  #   DIAGNOSIS / CONDITION ICD-10 ENCOUNTER ORDER     ICD-10-CM   1. AKI  (acute kidney injury) (HCC)  N17.9 Protein / creatinine ratio, urine    Cystatin C with Glomerular Filtration Rate, Estimated (eGFR)    Basic metabolic panel with GFR    Basic metabolic panel with GFR    Cystatin C with Glomerular Filtration Rate, Estimated (eGFR)    Protein / creatinine ratio, urine    spironolactone -hydrochlorothiazide  (ALDACTAZIDE) 25-25 MG tablet    DISCONTINUED: spironolactone -hydrochlorothiazide  (ALDACTAZIDE) 25-25 MG tablet    2. Resistant hypertension  I1A.0 Protein / creatinine ratio, urine    Cystatin C with Glomerular Filtration Rate, Estimated (eGFR)    Basic metabolic panel with GFR    Basic metabolic panel with GFR    Cystatin C with Glomerular Filtration Rate, Estimated (eGFR)    Protein / creatinine ratio, urine    Basic Metabolic Panel (BMET)    Microalbumin / creatinine urine ratio    spironolactone -hydrochlorothiazide  (ALDACTAZIDE) 25-25 MG tablet    DISCONTINUED: spironolactone -hydrochlorothiazide  (ALDACTAZIDE) 25-25 MG tablet    3. Chronic kidney disease (CKD), active medical management without dialysis, stage 3 (moderate) (HCC)  N18.30 Basic Metabolic Panel (BMET)    Microalbumin / creatinine urine ratio      Meds ordered this encounter  Medications   DISCONTD: spironolactone -hydrochlorothiazide  (ALDACTAZIDE) 25-25 MG tablet    Sig: Take 1 tablet by mouth daily.    Dispense:  90 tablet    Refill:  1   spironolactone -hydrochlorothiazide  (ALDACTAZIDE) 25-25 MG tablet    Sig: Take 1 tablet by mouth daily. To start just 0.25-0.5 mg tablet for 1 week.    Dispense:  90 tablet    Refill:  1    Orders Placed This Encounter  Procedures   Protein / creatinine ratio, urine    Standing Status:   Future    Number of Occurrences:   1    Expiration Date:   08/10/2024   Cystatin C with Glomerular Filtration Rate, Estimated (eGFR)    Standing Status:   Future    Number of Occurrences:   1    Expiration Date:   08/10/2024   Basic metabolic panel with  GFR    Standing Status:   Future    Number of Occurrences:   1    Expiration Date:   08/10/2024        This document was synthesized by artificial intelligence ([employer])  using HIPAA-compliant recording of the clinical interaction;   We discussed the use of AI scribe software for clinical note transcription with the patient, who gave verbal consent to proceed. additional Info: This encounter employed state-of-the-art, real-time, collaborative documentation. The patient actively reviewed and assisted in updating their electronic medical record on a shared screen, ensuring transparency and facilitating joint problem-solving for the problem list, overview, and plan. This approach promotes accurate, informed care. The treatment plan was discussed and reviewed in detail, including medication safety, potential side effects, and all patient questions. We confirmed understanding and comfort with the plan. Follow-up instructions were established, including contacting the office for any concerns, returning if symptoms worsen, persist, or new symptoms develop, and precautions for potential emergency department visits.

## 2023-08-11 NOTE — Patient Instructions (Signed)
 VISIT SUMMARY:  You visited today to discuss the management of your high blood pressure, chronic kidney disease, and high cholesterol. We reviewed your current medications and discussed adjustments to better control your blood pressure while monitoring your kidney function.  YOUR PLAN:  -HYPERTENSION: Hypertension, or high blood pressure, is when the force of your blood against your artery walls is too high, which can lead to health problems. Your blood pressure remains higher than desired, so we will continue your current dose of olmesartan  at 5 mg and add a combination of spironolactone  and hydrochlorothiazide  at a quarter dose to help lower it. We will avoid beta blockers due to potential side effects and interactions with your other medications. We will monitor your kidney function and protein levels in your urine with blood and urine tests.  -CHRONIC KIDNEY DISEASE: Chronic kidney disease means your kidneys are damaged and can't filter blood as well as they should. Your kidney function is stable on the current dose of olmesartan . We will continue to monitor your kidney function with blood and urine tests. If your kidney function remains stable without protein in your urine, we may consider resuming Jardiance  to help protect your kidneys.  -HYPERLIPIDEMIA: Hyperlipidemia is having high levels of fats (lipids) in your blood, which can increase your risk of heart disease. You are currently taking Crestor , which you tolerate well. We will continue this medication and monitor your lipid levels, adjusting the dosage if needed.  INSTRUCTIONS:  Please complete the blood and urine tests as ordered to monitor your kidney function and protein levels. Follow up with your cardiologist as scheduled for your cholesterol management. We will review your blood pressure and kidney function at your next visit.

## 2023-08-12 ENCOUNTER — Ambulatory Visit: Payer: Self-pay | Admitting: Internal Medicine

## 2023-08-12 LAB — PROTEIN / CREATININE RATIO, URINE
Creatinine, Urine: 28 mg/dL (ref 20–320)
Total Protein, Urine: <4 mg/dL — ABNORMAL LOW (ref 5–25)

## 2023-08-12 NOTE — Progress Notes (Signed)
 Looks stable and good despite 5 mg olmesartan . Hopefully it will stay that way on future checks. Ok to start the new blood pressure medication(s).   I sent in as 1 tablet daily but start at just a quarter tablet daily for week 1,

## 2023-08-14 LAB — CYSTATIN C WITH GLOMERULAR FILTRATION RATE, ESTIMATED (EGFR)
CYSTATIN C: 1.43 mg/L — ABNORMAL HIGH (ref 0.52–1.20)
eGFR: 47 mL/min/1.73m2 — ABNORMAL LOW (ref >=60)

## 2023-08-19 ENCOUNTER — Ambulatory Visit: Payer: Self-pay | Admitting: Internal Medicine

## 2023-08-19 ENCOUNTER — Other Ambulatory Visit (INDEPENDENT_AMBULATORY_CARE_PROVIDER_SITE_OTHER)

## 2023-08-19 DIAGNOSIS — I1A0 Resistant hypertension: Secondary | ICD-10-CM

## 2023-08-19 DIAGNOSIS — N183 Chronic kidney disease, stage 3 unspecified: Secondary | ICD-10-CM

## 2023-08-19 DIAGNOSIS — N1831 Chronic kidney disease, stage 3a: Secondary | ICD-10-CM

## 2023-08-19 LAB — MICROALBUMIN / CREATININE URINE RATIO
Creatinine,U: 140.6 mg/dL
Microalb Creat Ratio: 18.2 mg/g (ref 0.0–30.0)
Microalb, Ur: 2.6 mg/dL — ABNORMAL HIGH (ref 0.0–1.9)

## 2023-08-19 NOTE — Progress Notes (Signed)
 Surprised there is still tiny bit of microalbumin. Drink more fluid until our upcoming appointment.

## 2023-08-20 ENCOUNTER — Encounter: Payer: Self-pay | Admitting: Internal Medicine

## 2023-08-20 LAB — BASIC METABOLIC PANEL WITH GFR
BUN: 38 mg/dL — ABNORMAL HIGH (ref 6–23)
CO2: 21 meq/L (ref 19–32)
Calcium: 9.1 mg/dL (ref 8.4–10.5)
Chloride: 99 meq/L (ref 96–112)
Creatinine, Ser: 2.53 mg/dL — ABNORMAL HIGH (ref 0.40–1.50)
GFR: 25.74 mL/min — ABNORMAL LOW (ref >60.00)
Glucose, Bld: 110 mg/dL — ABNORMAL HIGH (ref 70–99)
Potassium: 4.7 meq/L (ref 3.5–5.1)
Sodium: 131 meq/L — ABNORMAL LOW (ref 135–145)

## 2023-08-22 NOTE — Telephone Encounter (Signed)
 Spoke with patient by phone and reinforced plan for new acute kidney injury.  Coordinated care to his nephrologist for future planning. Ordered repeat am labs.    ICD-10-CM   1. Acute kidney injury superimposed on stage 3a chronic kidney disease (HCC)  N17.9 Protein / creatinine ratio, urine   N18.31 Cystatin C with Glomerular Filtration Rate, Estimated (eGFR)    Comp Met (CMET)    CBC w/Diff    Urinalysis w microscopic + reflex cultur    Magnesium    Phosphorus

## 2023-08-23 ENCOUNTER — Other Ambulatory Visit (INDEPENDENT_AMBULATORY_CARE_PROVIDER_SITE_OTHER)

## 2023-08-23 DIAGNOSIS — I1A0 Resistant hypertension: Secondary | ICD-10-CM | POA: Diagnosis not present

## 2023-08-23 DIAGNOSIS — N179 Acute kidney failure, unspecified: Secondary | ICD-10-CM | POA: Diagnosis not present

## 2023-08-23 DIAGNOSIS — N1831 Chronic kidney disease, stage 3a: Secondary | ICD-10-CM

## 2023-08-23 DIAGNOSIS — N183 Chronic kidney disease, stage 3 unspecified: Secondary | ICD-10-CM | POA: Diagnosis not present

## 2023-08-23 LAB — COMPREHENSIVE METABOLIC PANEL WITH GFR
ALT: 16 U/L (ref 0–53)
AST: 17 U/L (ref 0–37)
Albumin: 4.4 g/dL (ref 3.5–5.2)
Alkaline Phosphatase: 89 U/L (ref 39–117)
BUN: 25 mg/dL — ABNORMAL HIGH (ref 6–23)
CO2: 25 meq/L (ref 19–32)
Calcium: 9.1 mg/dL (ref 8.4–10.5)
Chloride: 104 meq/L (ref 96–112)
Creatinine, Ser: 1.54 mg/dL — ABNORMAL HIGH (ref 0.40–1.50)
GFR: 46.69 mL/min — ABNORMAL LOW (ref >60.00)
Glucose, Bld: 124 mg/dL — ABNORMAL HIGH (ref 70–99)
Potassium: 4.5 meq/L (ref 3.5–5.1)
Sodium: 136 meq/L (ref 135–145)
Total Bilirubin: 0.5 mg/dL (ref 0.2–1.2)
Total Protein: 6.8 g/dL (ref 6.0–8.3)

## 2023-08-23 LAB — CBC WITH DIFFERENTIAL/PLATELET
Basophils Absolute: 0.1 K/uL (ref 0.0–0.1)
Basophils Relative: 1.5 % (ref 0.0–3.0)
Eosinophils Absolute: 0.4 K/uL (ref 0.0–0.7)
Eosinophils Relative: 7.8 % — ABNORMAL HIGH (ref 0.0–5.0)
HCT: 41.5 % (ref 39.0–52.0)
Hemoglobin: 13.8 g/dL (ref 13.0–17.0)
Lymphocytes Relative: 29 % (ref 12.0–46.0)
Lymphs Abs: 1.3 K/uL (ref 0.7–4.0)
MCHC: 33.3 g/dL (ref 30.0–36.0)
MCV: 89.1 fl (ref 78.0–100.0)
Monocytes Absolute: 0.7 K/uL (ref 0.1–1.0)
Monocytes Relative: 16.3 % — ABNORMAL HIGH (ref 3.0–12.0)
Neutro Abs: 2.1 K/uL (ref 1.4–7.7)
Neutrophils Relative %: 45.4 % (ref 43.0–77.0)
Platelets: 150 K/uL (ref 150.0–400.0)
RBC: 4.66 Mil/uL (ref 4.22–5.81)
RDW: 13.5 % (ref 11.5–15.5)
WBC: 4.6 K/uL (ref 4.0–10.5)

## 2023-08-23 LAB — MAGNESIUM: Magnesium: 2.3 mg/dL (ref 1.5–2.5)

## 2023-08-23 LAB — MICROALBUMIN / CREATININE URINE RATIO
Creatinine,U: 81.1 mg/dL
Microalb Creat Ratio: 11.2 mg/g (ref 0.0–30.0)
Microalb, Ur: 0.9 mg/dL (ref 0.0–1.9)

## 2023-08-23 LAB — PHOSPHORUS: Phosphorus: 4 mg/dL (ref 2.3–4.6)

## 2023-08-24 ENCOUNTER — Ambulatory Visit: Payer: Self-pay | Admitting: Internal Medicine

## 2023-08-24 LAB — NO CULTURE INDICATED

## 2023-08-24 LAB — PROTEIN / CREATININE RATIO, URINE
Creatinine, Urine: 84 mg/dL (ref 20–320)
Protein/Creat Ratio: 190 mg/g{creat} — ABNORMAL HIGH (ref 25–148)
Protein/Creatinine Ratio: 0.19 mg/mg{creat} — ABNORMAL HIGH (ref 0.025–0.148)
Total Protein, Urine: 16 mg/dL (ref 5–25)

## 2023-08-24 LAB — URINALYSIS W MICROSCOPIC + REFLEX CULTURE
Bacteria, UA: NONE SEEN /HPF
Bilirubin Urine: NEGATIVE
Hgb urine dipstick: NEGATIVE
Hyaline Cast: NONE SEEN /LPF
Ketones, ur: NEGATIVE
Leukocyte Esterase: NEGATIVE
Nitrites, Initial: NEGATIVE
Protein, ur: NEGATIVE
RBC / HPF: NONE SEEN /HPF (ref 0–2)
Specific Gravity, Urine: 1.019 (ref 1.001–1.035)
WBC, UA: NONE SEEN /HPF (ref 0–5)
pH: 6 (ref 5.0–8.0)

## 2023-08-25 DIAGNOSIS — N1832 Chronic kidney disease, stage 3b: Secondary | ICD-10-CM | POA: Diagnosis not present

## 2023-08-25 DIAGNOSIS — I129 Hypertensive chronic kidney disease with stage 1 through stage 4 chronic kidney disease, or unspecified chronic kidney disease: Secondary | ICD-10-CM | POA: Diagnosis not present

## 2023-08-26 LAB — CYSTATIN C WITH GLOMERULAR FILTRATION RATE, ESTIMATED (EGFR)
CYSTATIN C: 1.39 mg/L — ABNORMAL HIGH (ref 0.52–1.20)
eGFR: 49 mL/min/1.73m2 — ABNORMAL LOW (ref >=60)

## 2023-08-30 ENCOUNTER — Ambulatory Visit (INDEPENDENT_AMBULATORY_CARE_PROVIDER_SITE_OTHER): Admitting: Internal Medicine

## 2023-08-30 ENCOUNTER — Encounter: Payer: Self-pay | Admitting: Internal Medicine

## 2023-08-30 VITALS — BP 112/60 | HR 64 | Temp 98.0°F | Ht 67.0 in | Wt 171.2 lb

## 2023-08-30 DIAGNOSIS — I1A0 Resistant hypertension: Secondary | ICD-10-CM | POA: Diagnosis not present

## 2023-08-30 DIAGNOSIS — I10 Essential (primary) hypertension: Secondary | ICD-10-CM | POA: Diagnosis not present

## 2023-08-30 DIAGNOSIS — N183 Chronic kidney disease, stage 3 unspecified: Secondary | ICD-10-CM

## 2023-08-30 DIAGNOSIS — N179 Acute kidney failure, unspecified: Secondary | ICD-10-CM

## 2023-08-30 LAB — BASIC METABOLIC PANEL WITH GFR
BUN: 24 mg/dL — ABNORMAL HIGH (ref 6–23)
CO2: 24 meq/L (ref 19–32)
Calcium: 9.4 mg/dL (ref 8.4–10.5)
Chloride: 105 meq/L (ref 96–112)
Creatinine, Ser: 1.43 mg/dL (ref 0.40–1.50)
GFR: 51.03 mL/min — ABNORMAL LOW (ref >60.00)
Glucose, Bld: 105 mg/dL — ABNORMAL HIGH (ref 70–99)
Potassium: 4.3 meq/L (ref 3.5–5.1)
Sodium: 138 meq/L (ref 135–145)

## 2023-08-30 LAB — MICROALBUMIN / CREATININE URINE RATIO
Creatinine,U: 46.9 mg/dL
Microalb Creat Ratio: UNDETERMINED mg/g (ref 0.0–30.0)
Microalb, Ur: <0.7 mg/dL

## 2023-08-30 MED ORDER — TADALAFIL 10 MG PO TABS: 10.0000 mg | ORAL_TABLET | ORAL | 1 refills | Status: DC | PRN

## 2023-08-30 NOTE — Assessment & Plan Note (Signed)
 Hypertension is managed with doxazosin  and amlodipine , showing improvement from 130-160 mmHg to 120-150 mmHg. Proteinuria was previously noted, with hopes for resolution. Creatinine levels spiked with olmesartan  and Jardiance  but returned to baseline after discontinuation. There is clinical suspicion of renal artery stenosis, though imaging shows no evidence. Jardiance  may be restarted if proteinuria resolves and blood work remains stable. Tadalafil  is considered for blood pressure improvement and potential kidney function benefit, preferred over sildenafil due to its longer half-life and potential for more stable blood pressure control. Check urine protein to creatinine ratio, BMP, and GFR. Restart Jardiance  if proteinuria resolves and blood work is stable. Prescribe tadalafil  10 mg, instruct to start with 5 mg daily and increase to 10 mg if blood pressure remains above 130/80 mmHg. Avoid NSAIDs. Share renal ultrasound results with nephrologist.Intermittent edema of the lower extremities. Compression socks have been used with some benefit. Continue use of compression socks as needed.

## 2023-08-30 NOTE — Progress Notes (Signed)
 ==============================  [facility]: [phone]   -- Medical Office Visit --  Patient: Eric Crane      Age: 68 y.o.       Sex:  male  Date:   08/30/2023 Today's Healthcare Provider: Bernardino KANDICE Cone, MD  ==============================   Chief Complaint: Chronic Kidney Disease Orel[place] to kidney dr since last visit.)  Discussed the use of AI scribe software for clinical note transcription with the patient, who gave verbal consent to proceed.  History of Present Illness  68 year old male with hypertension and kidney injury who presents for follow-up on blood pressure management and proteinuria.  He has a history of hypertension and kidney injury, with recent concerns about mild proteinuria noted during his last check. He is prepared to provide a urine sample for re-evaluation. His blood pressure has been in the range of 130s to 150s, with recent improvements noted to 120s to 150s. He occasionally experiences ankle swelling, which he manages with compression socks. No dizziness or significant swelling is present at this time.  A duplex renal ultrasound performed on Jun 24, 2023, showed no evidence of renal artery stenosis. His creatinine levels had previously increased from 1.4 to 2.5, leading to the cessation of olmesartan  and Jardiance , after which his creatinine returned to 1.5.  His current medication regimen includes doxazosin  2 mg PO BID and amlodipine  10 mg daily. He has been missing some evening blood pressure readings but reports recent blood pressure improvements. Background Reviewed: Problem List: has Primary osteoarthritis of right knee; Weight disorder; FH: heart disease; Osteoarthritis of lower back; Hyperlipidemia, acquired; Thrombocytopenia (HCC); Attention deficit hyperactivity disorder (ADHD); Bipolar 2 disorder (HCC); H/O total knee replacement; Prediabetes; Statin intolerance; Status post total right knee replacement; Pain in  left hip; Medication management; Chronic kidney disease (CKD), active medical management without dialysis, stage 3 (moderate) (HCC); Hypertension; Coronary artery calcification; Elevated serum creatinine; Nephrolithiasis; Gallstones; Injury to kidney; Elevated PSA; and Polycystic kidney disease on their problem list. Past Medical History:  has a past medical history of Arthritis (2021), Bipolar 2 disorder (HCC) (11/20/2021), Chronic kidney disease (03/26/2023), Depression, FH: heart disease (11/20/2021), Hypertension (03/25/2024), Osteoarthritis of lower back (11/20/2021), Overweight (11/20/2021), and Thrombocytopenia (HCC) (11/20/2021). Past Surgical History:   has a past surgical history that includes Colonoscopy with propofol  (N/A, 02/18/2016); Tonsillectomy; and Total knee arthroplasty (Right, 02/02/2022). Social History:   reports that he has never smoked. He has never used smokeless tobacco. He reports that he does not drink alcohol and does not use drugs. Family History:  family history includes Heart disease in his mother. Allergies:  has no known allergies.   Medication Reconciliation: Current Outpatient Medications on File Prior to Visit  Medication Sig   amLODipine  (NORVASC ) 10 MG tablet Take 1 tablet (10 mg total) by mouth daily.   amphetamine-dextroamphetamine (ADDERALL) 20 MG tablet Take 20 mg by mouth 3 (three) times daily.   aspirin  EC 81 MG tablet Take 1 tablet (81 mg total) by mouth daily. Swallow whole.   doxazosin  (CARDURA ) 1 MG tablet Take 1 mg by mouth daily. (Patient taking differently: Take 1 mg by mouth daily. 2mg  twice a day)   lamoTRIgine  (LAMICTAL ) 200 MG tablet Take 200 mg by mouth daily.   Lurasidone HCl 120 MG TABS    QUEtiapine  Fumarate (SEROQUEL  XR) 150 MG 24 hr tablet Take 300 mg by mouth at bedtime.   rosuvastatin  (CRESTOR ) 20 MG tablet Take 1 tablet (20 mg total) by mouth daily.  empagliflozin  (JARDIANCE ) 25 MG TABS tablet Take 1 tablet (25 mg total) by mouth  daily before breakfast. (Patient not taking: Reported on 08/11/2023)   spironolactone -hydrochlorothiazide  (ALDACTAZIDE) 25-25 MG tablet Take 1 tablet by mouth daily. To start just 0.25-0.5 mg tablet for 1 week. (Patient not taking: Reported on 08/30/2023)   No current facility-administered medications on file prior to visit.  There are no discontinued medications.   Physical Exam:    08/30/2023   11:07 AM 08/11/2023    7:57 AM 07/09/2023    7:47 AM  Vitals with BMI  Height 5' 7 5' 7 5' 7  Weight 171 lbs 3 oz 169 lbs 3 oz   BMI 26.81 26.49   Systolic 112 130 889  Diastolic 60 70 58  Pulse 64 74 82  Vital signs reviewed.  Nursing notes reviewed. Weight trend reviewed. Physical Exam General Appearance:  No acute distress appreciable.   Well-groomed, healthy-appearing male.  Well proportioned with no abnormal fat distribution.  Good muscle tone. Pulmonary:  Normal work of breathing at rest, no respiratory distress apparent. SpO2: 98 %  Musculoskeletal: All extremities are intact.  Neurological:  Awake, alert, oriented, and engaged.  No obvious focal neurological deficits or cognitive impairments.  Sensorium seems unclouded.   Speech is clear and coherent with logical content. Psychiatric:  Appropriate mood, pleasant and cooperative demeanor, thoughtful and engaged during the exam  Results LABS Creatinine: 1.5 (08/25/2023)  RADIOLOGY Renal Ultrasound: Simple cysts, no evidence of renal artery stenosis (06/24/2023)    Results for orders placed or performed in visit on 08/30/23  Basic Metabolic Panel (BMET)  Result Value Ref Range   Sodium 138 135 - 145 mEq/L   Potassium 4.3 3.5 - 5.1 mEq/L   Chloride 105 96 - 112 mEq/L   CO2 24 19 - 32 mEq/L   Glucose, Bld 105 (H) 70 - 99 mg/dL   BUN 24 (H) 6 - 23 mg/dL   Creatinine, Ser 8.56 0.40 - 1.50 mg/dL   GFR 48.96 (L) >39.99 mL/min   Calcium  9.4 8.4 - 10.5 mg/dL  Microalbumin / creatinine urine ratio  Result Value Ref Range   Microalb,  Ur <0.7 mg/dL   Creatinine,U 53.0 mg/dL   Microalb Creat Ratio Unable to calculate 0.0 - 30.0 mg/g   Office Visit on 08/30/2023  Component Date Value Ref Range Status   Sodium 08/30/2023 138  135 - 145 mEq/L Final   Potassium 08/30/2023 4.3  3.5 - 5.1 mEq/L Final   Chloride 08/30/2023 105  96 - 112 mEq/L Final   CO2 08/30/2023 24  19 - 32 mEq/L Final   Glucose, Bld 08/30/2023 105 (H)  70 - 99 mg/dL Final   BUN 91/95/7974 24 (H)  6 - 23 mg/dL Final   Creatinine, Ser 08/30/2023 1.43  0.40 - 1.50 mg/dL Final   GFR 91/95/7974 51.03 (L)  >60.00 mL/min Final   Calcium  08/30/2023 9.4  8.4 - 10.5 mg/dL Final   Microalb, Ur 91/95/7974 <0.7  mg/dL Final   Creatinine,U 91/95/7974 46.9  mg/dL Final   Microalb Creat Ratio 08/30/2023 Unable to calculate  0.0 - 30.0 mg/g Final  Lab on 08/23/2023  Component Date Value Ref Range Status   Phosphorus 08/23/2023 4.0  2.3 - 4.6 mg/dL Final   Magnesium 92/71/7974 2.3  1.5 - 2.5 mg/dL Final   WBC 92/71/7974 4.6  4.0 - 10.5 K/uL Final   RBC 08/23/2023 4.66  4.22 - 5.81 Mil/uL Final   Hemoglobin 08/23/2023 13.8  13.0 -  17.0 g/dL Final   HCT 92/71/7974 41.5  39.0 - 52.0 % Final   MCV 08/23/2023 89.1  78.0 - 100.0 fl Final   MCHC 08/23/2023 33.3  30.0 - 36.0 g/dL Final   RDW 92/71/7974 13.5  11.5 - 15.5 % Final   Platelets 08/23/2023 150.0  150.0 - 400.0 K/uL Final   Neutrophils Relative % 08/23/2023 45.4  43.0 - 77.0 % Final   Lymphocytes Relative 08/23/2023 29.0  12.0 - 46.0 % Final   Monocytes Relative 08/23/2023 16.3 (H)  3.0 - 12.0 % Final   Eosinophils Relative 08/23/2023 7.8 (H)  0.0 - 5.0 % Final   Basophils Relative 08/23/2023 1.5  0.0 - 3.0 % Final   Neutro Abs 08/23/2023 2.1  1.4 - 7.7 K/uL Final   Lymphs Abs 08/23/2023 1.3  0.7 - 4.0 K/uL Final   Monocytes Absolute 08/23/2023 0.7  0.1 - 1.0 K/uL Final   Eosinophils Absolute 08/23/2023 0.4  0.0 - 0.7 K/uL Final   Basophils Absolute 08/23/2023 0.1  0.0 - 0.1 K/uL Final   Sodium 08/23/2023 136   135 - 145 mEq/L Final   Potassium 08/23/2023 4.5  3.5 - 5.1 mEq/L Final   Chloride 08/23/2023 104  96 - 112 mEq/L Final   CO2 08/23/2023 25  19 - 32 mEq/L Final   Glucose, Bld 08/23/2023 124 (H)  70 - 99 mg/dL Final   BUN 92/71/7974 25 (H)  6 - 23 mg/dL Final   Creatinine, Ser 08/23/2023 1.54 (H)  0.40 - 1.50 mg/dL Final   Total Bilirubin 08/23/2023 0.5  0.2 - 1.2 mg/dL Final   Alkaline Phosphatase 08/23/2023 89  39 - 117 U/L Final   AST 08/23/2023 17  0 - 37 U/L Final   ALT 08/23/2023 16  0 - 53 U/L Final   Total Protein 08/23/2023 6.8  6.0 - 8.3 g/dL Final   Albumin 92/71/7974 4.4  3.5 - 5.2 g/dL Final   GFR 92/71/7974 46.69 (L)  >60.00 mL/min Final   Calcium  08/23/2023 9.1  8.4 - 10.5 mg/dL Final   CYSTATIN C 92/71/7974 1.39 (H)  0.52 - 1.20 mg/L Final   eGFR 08/23/2023 49 (L)  >=60 mL/min/1.40m2 Final   Color, Urine 08/23/2023 YELLOW  YELLOW Final   APPearance 08/23/2023 CLEAR  CLEAR Final   Specific Gravity, Urine 08/23/2023 1.019  1.001 - 1.035 Final   pH 08/23/2023 6.0  5.0 - 8.0 Final   Glucose, UA 08/23/2023 3+ (A)  NEGATIVE Final   Bilirubin Urine 08/23/2023 NEGATIVE  NEGATIVE Final   Ketones, ur 08/23/2023 NEGATIVE  NEGATIVE Final   Hgb urine dipstick 08/23/2023 NEGATIVE  NEGATIVE Final   Protein, ur 08/23/2023 NEGATIVE  NEGATIVE Final   Nitrites, Initial 08/23/2023 NEGATIVE  NEGATIVE Final   Leukocyte Esterase 08/23/2023 NEGATIVE  NEGATIVE Final   WBC, UA 08/23/2023 NONE SEEN  0 - 5 /HPF Final   RBC / HPF 08/23/2023 NONE SEEN  0 - 2 /HPF Final   Squamous Epithelial / HPF 08/23/2023 0-5  < OR = 5 /HPF Final   Bacteria, UA 08/23/2023 NONE SEEN  NONE SEEN /HPF Final   Hyaline Cast 08/23/2023 NONE SEEN  NONE SEEN /LPF Final   Note 08/23/2023    Final   Creatinine, Urine 08/23/2023 84  20 - 320 mg/dL Final   Protein/Creat Ratio 08/23/2023 190 (H)  25 - 148 mg/g creat Final   Protein/Creatinine Ratio 08/23/2023 0.190 (H)  0.025 - 0.148 mg/mg creat Final   Total Protein,  Urine 08/23/2023 16  5 - 25 mg/dL Final   Microalb, Ur 92/71/7974 0.9  0.0 - 1.9 mg/dL Final   Creatinine,U 92/71/7974 81.1  mg/dL Final   Microalb Creat Ratio 08/23/2023 11.2  0.0 - 30.0 mg/g Final   Reflexve Urine Culture 08/23/2023    Final  Lab on 08/19/2023  Component Date Value Ref Range Status   Sodium 08/19/2023 131 (L)  135 - 145 mEq/L Final   Potassium 08/19/2023 4.7  3.5 - 5.1 mEq/L Final   Chloride 08/19/2023 99  96 - 112 mEq/L Final   CO2 08/19/2023 21  19 - 32 mEq/L Final   Glucose, Bld 08/19/2023 110 (H)  70 - 99 mg/dL Final   BUN 92/75/7974 38 (H)  6 - 23 mg/dL Final   Creatinine, Ser 08/19/2023 2.53 (H)  0.40 - 1.50 mg/dL Final   GFR 92/75/7974 25.74 (L)  >60.00 mL/min Final   Calcium  08/19/2023 9.1  8.4 - 10.5 mg/dL Final   Microalb, Ur 92/75/7974 2.6 (H)  0.0 - 1.9 mg/dL Final   Creatinine,U 92/75/7974 140.6  mg/dL Final   Microalb Creat Ratio 08/19/2023 18.2  0.0 - 30.0 mg/g Final  Office Visit on 08/11/2023  Component Date Value Ref Range Status   Sodium 08/11/2023 137  135 - 145 mEq/L Final   Potassium 08/11/2023 4.3  3.5 - 5.1 mEq/L Final   Chloride 08/11/2023 105  96 - 112 mEq/L Final   CO2 08/11/2023 23  19 - 32 mEq/L Final   Glucose, Bld 08/11/2023 101 (H)  70 - 99 mg/dL Final   BUN 92/83/7974 24 (H)  6 - 23 mg/dL Final   Creatinine, Ser 08/11/2023 1.40  0.40 - 1.50 mg/dL Final   GFR 92/83/7974 52.36 (L)  >60.00 mL/min Final   Calcium  08/11/2023 9.4  8.4 - 10.5 mg/dL Final   CYSTATIN C 92/83/7974 1.43 (H)  0.52 - 1.20 mg/L Final   eGFR 08/11/2023 47 (L)  >=60 mL/min/1.24m2 Final   Creatinine, Urine 08/11/2023 28  20 - 320 mg/dL Final   Protein/Creat Ratio 08/11/2023 NOTE  25 - 148 mg/g creat Final   Protein/Creatinine Ratio 08/11/2023 NOTE  0.025 - 0.148 mg/mg creat Final   Total Protein, Urine 08/11/2023 <4 (L)  5 - 25 mg/dL Final  Lab on 92/89/7974  Component Date Value Ref Range Status   Sodium 08/05/2023 134 (L)  135 - 145 mEq/L Final   Potassium  08/05/2023 4.4  3.5 - 5.1 mEq/L Final   Chloride 08/05/2023 104  96 - 112 mEq/L Final   CO2 08/05/2023 24  19 - 32 mEq/L Final   Glucose, Bld 08/05/2023 143 (H)  70 - 99 mg/dL Final   BUN 92/89/7974 22  6 - 23 mg/dL Final   Creatinine, Ser 08/05/2023 1.41  0.40 - 1.50 mg/dL Final   GFR 92/89/7974 51.92 (L)  >60.00 mL/min Final   Calcium  08/05/2023 9.1  8.4 - 10.5 mg/dL Final   Microalb, Ur 92/89/7974 2.1 (H)  0.0 - 1.9 mg/dL Final   Creatinine,U 92/89/7974 121.0  mg/dL Final   Microalb Creat Ratio 08/05/2023 17.2  0.0 - 30.0 mg/g Final  Appointment on 07/29/2023  Component Date Value Ref Range Status   Microalb, Ur 07/29/2023 1.2  0.0 - 1.9 mg/dL Final   Creatinine,U 92/96/7974 106.0  mg/dL Final   Microalb Creat Ratio 07/29/2023 11.3  0.0 - 30.0 mg/g Final   Sodium 07/29/2023 139  135 - 145 mEq/L Final   Potassium 07/29/2023 4.9  3.5 -  5.1 mEq/L Final   Chloride 07/29/2023 106  96 - 112 mEq/L Final   CO2 07/29/2023 27  19 - 32 mEq/L Final   Glucose, Bld 07/29/2023 130 (H)  70 - 99 mg/dL Final   BUN 92/96/7974 25 (H)  6 - 23 mg/dL Final   Creatinine, Ser 07/29/2023 1.52 (H)  0.40 - 1.50 mg/dL Final   GFR 92/96/7974 47.45 (L)  >60.00 mL/min Final   Calcium  07/29/2023 9.4  8.4 - 10.5 mg/dL Final   CYSTATIN C 92/96/7974 1.45 (H)  0.52 - 1.20 mg/L Final   eGFR 07/29/2023 46 (L)  >=60 mL/min/1.36m2 Final   Creatinine, Urine 07/29/2023 106  20 - 320 mg/dL Final   Protein/Creat Ratio 07/29/2023 170 (H)  25 - 148 mg/g creat Final   Protein/Creatinine Ratio 07/29/2023 0.170 (H)  0.025 - 0.148 mg/mg creat Final   Total Protein, Urine 07/29/2023 18  5 - 25 mg/dL Final  Lab on 93/72/7974  Component Date Value Ref Range Status   Sodium 07/23/2023 133 (L)  135 - 145 mEq/L Final   Potassium 07/23/2023 5.2 No hemolysis seen (H)  3.5 - 5.1 mEq/L Final   Chloride 07/23/2023 105  96 - 112 mEq/L Final   CO2 07/23/2023 22  19 - 32 mEq/L Final   Glucose, Bld 07/23/2023 102 (H)  70 - 99 mg/dL Final    BUN 93/72/7974 41 (H)  6 - 23 mg/dL Final   Creatinine, Ser 07/23/2023 2.06 (H)  0.40 - 1.50 mg/dL Final   GFR 93/72/7974 32.95 (L)  >60.00 mL/min Final   Calcium  07/23/2023 9.3  8.4 - 10.5 mg/dL Final   Microalb, Ur 93/72/7974 2.0 (H)  0.0 - 1.9 mg/dL Final   Creatinine,U 93/72/7974 89.3  mg/dL Final   Microalb Creat Ratio 07/23/2023 22.4  0.0 - 30.0 mg/g Final  Office Visit on 07/09/2023  Component Date Value Ref Range Status   Sodium 07/09/2023 138  135 - 145 mEq/L Final   Potassium 07/09/2023 4.1  3.5 - 5.1 mEq/L Final   Chloride 07/09/2023 105  96 - 112 mEq/L Final   CO2 07/09/2023 24  19 - 32 mEq/L Final   Glucose, Bld 07/09/2023 95  70 - 99 mg/dL Final   BUN 93/86/7974 24 (H)  6 - 23 mg/dL Final   Creatinine, Ser 07/09/2023 1.45  0.40 - 1.50 mg/dL Final   GFR 93/86/7974 50.23 (L)  >60.00 mL/min Final   Calcium  07/09/2023 9.0  8.4 - 10.5 mg/dL Final   Microalb, Ur 93/86/7974 <0.7  mg/dL Final   Creatinine,U 93/86/7974 56.7  mg/dL Final   Microalb Creat Ratio 07/09/2023 Unable to calculate  0.0 - 30.0 mg/g Final  Lab on 06/30/2023  Component Date Value Ref Range Status   CYSTATIN C 06/30/2023 1.54 (H)  0.52 - 1.20 mg/L Final   eGFR 06/30/2023 43 (L)  >=60 mL/min/1.63m2 Final   Microalb, Ur 06/30/2023 <0.7  mg/dL Final   Creatinine,U 93/95/7974 48.8  mg/dL Final   Microalb Creat Ratio 06/30/2023 Unable to calculate  0.0 - 30.0 mg/g Final   Sodium 06/30/2023 138  135 - 145 mEq/L Final   Potassium 06/30/2023 4.4  3.5 - 5.1 mEq/L Final   Chloride 06/30/2023 107  96 - 112 mEq/L Final   CO2 06/30/2023 25  19 - 32 mEq/L Final   Glucose, Bld 06/30/2023 99  70 - 99 mg/dL Final   BUN 93/95/7974 25 (H)  6 - 23 mg/dL Final   Creatinine, Ser 06/30/2023 1.45  0.40 - 1.50 mg/dL Final   GFR 93/95/7974 50.24 (L)  >60.00 mL/min Final   Calcium  06/30/2023 9.3  8.4 - 10.5 mg/dL Final  Scanned Document on 06/22/2023  Component Date Value Ref Range Status   EGFR 06/20/2023 51.0   Final    Creatinine, POC 06/20/2023 75.4  mg/dL Final   Albumin, Urine POC 06/20/2023 5.3   Final   Microalb Creat Ratio 06/20/2023 7   Final  There may be more visits with results that are not included.  No image results found. US  RENAL ARTERY DUPLEX COMPLETE Result Date: 06/24/2023 CLINICAL DATA:  aki, ckd, intolerant of arb (raises creatinine) eval for renal artery stenosis. EXAM: RENAL/URINARY TRACT ULTRASOUND RENAL DUPLEX DOPPLER ULTRASOUND COMPARISON:  CT renal stone protocol 05/05/2023. Renal ultrasound 04/28/2023. FINDINGS: Right Kidney: Length: 9.1 cm. Increased echogenicity. No mass or hydronephrosis visualized. Simple cortical cysts measuring up to 2.8 cm. Left Kidney: Length: 12 cm. Increased echogenicity. No mass or hydronephrosis visualized. Simple cortical cysts measuring up to 4.2 cm. Bladder:  Unremarkable RENAL DUPLEX ULTRASOUND Right Renal Artery Velocities: Origin:  130 cm/sec Mid:  89 cm/sec Hilum:  120 cm/sec Interlobar:  62 cm/sec Arcuate:  33 cm/sec Left Renal Artery Velocities: Origin:  110 cm/sec Mid:  85 cm/sec Hilum:  69 cm/sec Interlobar:  35 cm/sec Arcuate:  26 cm/sec Aortic Velocity:  97 cm/sec Right Renal-Aortic Ratios: Origin: 1.3 Mid:  0.9 Hilum: 1.2 Interlobar: 0.6 Arcuate: 0.3 Left Renal-Aortic Ratios: Origin: 1.1 Mid: 0.9 Hilum: 0.7 Interlobar: 0.36 Arcuate: 0.27 IMPRESSION: 1. No Doppler evidence of renal artery stenosis. 2. Increased renal echogenicity consistent with medical renal disease. No hydronephrosis. Electronically Signed   By: Gwynneth Akin M.D.   On: 06/24/2023 12:29         ASSESSMENT & PLAN   Assessment & Plan Primary hypertension Acute kidney injury (HCC) Resistant hypertension Chronic kidney disease (CKD), active medical management without dialysis, stage 3 (moderate) (HCC) Hypertension is managed with doxazosin  and amlodipine , showing improvement from 130-160 mmHg to 120-150 mmHg. Proteinuria was previously noted, with hopes for resolution.  Creatinine levels spiked with olmesartan  and Jardiance  but returned to baseline after discontinuation. There is clinical suspicion of renal artery stenosis, though imaging shows no evidence. Jardiance  may be restarted if proteinuria resolves and blood work remains stable. Tadalafil  is considered for blood pressure improvement and potential kidney function benefit, preferred over sildenafil due to its longer half-life and potential for more stable blood pressure control. Check urine protein to creatinine ratio, BMP, and GFR. Restart Jardiance  if proteinuria resolves and blood work is stable. Prescribe tadalafil  10 mg, instruct to start with 5 mg daily and increase to 10 mg if blood pressure remains above 130/80 mmHg. Avoid NSAIDs. Share renal ultrasound results with nephrologist.Intermittent edema of the lower extremities. Compression socks have been used with some benefit. Continue use of compression socks as needed.  ORDER ASSOCIATIONS  #   DIAGNOSIS / CONDITION ICD-10 ENCOUNTER ORDER     ICD-10-CM   1. Primary hypertension  I10 tadalafil  (CIALIS ) 10 MG tablet    2. Acute kidney injury (HCC)  N17.9 Protein / creatinine ratio, urine    Cystatin C with Glomerular Filtration Rate, Estimated (eGFR)    Basic metabolic panel with GFR    Basic metabolic panel with GFR    Cystatin C with Glomerular Filtration Rate, Estimated (eGFR)    Protein / creatinine ratio, urine    3. Resistant hypertension  I1A.0 Basic Metabolic Panel (BMET)    Microalbumin /  creatinine urine ratio    4. Chronic kidney disease (CKD), active medical management without dialysis, stage 3 (moderate) (HCC)  N18.30 Basic Metabolic Panel (BMET)    Microalbumin / creatinine urine ratio     Meds ordered this encounter  Medications   tadalafil  (CIALIS ) 10 MG tablet    Sig: Take 1 tablet (10 mg total) by mouth every other day as needed for erectile dysfunction.    Dispense:  10 tablet    Refill:  1   Orders Placed This Encounter   Procedures   Protein / creatinine ratio, urine    Standing Status:   Future    Number of Occurrences:   1    Expiration Date:   08/29/2024   Cystatin C with Glomerular Filtration Rate, Estimated (eGFR)    Standing Status:   Future    Number of Occurrences:   1    Expiration Date:   08/29/2024   Basic metabolic panel with GFR    Standing Status:   Future    Number of Occurrences:   1    Expiration Date:   08/29/2024     This document was synthesized by artificial intelligence ([employer]) using HIPAA-compliant recording of the clinical interaction;   We discussed the use of AI scribe software for clinical note transcription with the patient, who gave verbal consent to proceed. additional Info: This encounter employed state-of-the-art, real-time, collaborative documentation. The patient actively reviewed and assisted in updating their electronic medical record on a shared screen, ensuring transparency and facilitating joint problem-solving for the problem list, overview, and plan. This approach promotes accurate, informed care. The treatment plan was discussed and reviewed in detail, including medication safety, potential side effects, and all patient questions. We confirmed understanding and comfort with the plan. Follow-up instructions were established, including contacting the office for any concerns, returning if symptoms worsen, persist, or new symptoms develop, and precautions for potential emergency department visits.

## 2023-08-30 NOTE — Patient Instructions (Signed)
 It was a pleasure seeing you today! Your health and satisfaction are our top priorities.  Crane Cone, MD  VISIT SUMMARY: Today, we reviewed your blood pressure management and kidney health. Your blood pressure has shown improvement, and we discussed your recent proteinuria and kidney function. We also addressed your occasional ankle swelling.  YOUR PLAN: -HYPERTENSION WITH CHRONIC KIDNEY DISEASE AND PROTEINURIA: Hypertension is high blood pressure, and chronic kidney disease means your kidneys are not working as well as they should. Proteinuria is the presence of excess protein in your urine, which can be a sign of kidney damage. Your blood pressure is improving with your current medications, doxazosin  and amlodipine . We will check your urine protein to creatinine ratio, BMP, and GFR. If your proteinuria resolves and your blood work is stable, we may restart Jardiance . We are also prescribing tadalafil  10 mg; start with 5 mg daily and increase to 10 mg if your blood pressure remains above 130/80 mmHg. Avoid NSAIDs and we will share your renal ultrasound results with your nephrologist.  -EDEMA OF LOWER EXTREMITIES: Edema is swelling caused by fluid buildup. You have occasional swelling in your ankles, which you manage with compression socks. Continue using the compression socks as needed.  INSTRUCTIONS: Please provide a urine sample for re-evaluation. We will check your urine protein to creatinine ratio, BMP, and GFR. If your proteinuria resolves and your blood work is stable, we may restart Jardiance . Start taking tadalafil  5 mg daily and increase to 10 mg if your blood pressure remains above 130/80 mmHg. Avoid NSAIDs. We will share your renal ultrasound results with your nephrologist.  Your Providers PCP: Crane Crane MATSU, MD,  [phone]) Referring Provider: Cone Crane MATSU, MD,  [phone]) Care Team Provider: Jerri Kay HERO, MD,  [phone]) Care Team Provider: [name], MD,   [phone]) Care Team Provider: Persons, Crane Crane, [place],  [phone]) Care Team Provider: [name], MD,  [phone])  NEXT STEPS: [x]  Early Intervention: Schedule sooner appointment, call our on-call services, or go to emergency room if there is any significant Increase in pain or discomfort New or worsening symptoms Sudden or severe changes in your health [x]  Flexible Follow-Up: We recommend a No follow-ups on file. for optimal routine care. This allows for progress monitoring and treatment adjustments. [x]  Preventive Care: Schedule your annual preventive care visit! It's typically covered by insurance and helps identify potential health issues early. [x]  Lab & X-ray Appointments: Incomplete tests scheduled today, or call to schedule. X-rays: [facility] (M-F, 8:30am-noon or 1pm-5pm). [x]  Medical Information Release: Sign a release form at front desk to obtain relevant medical information we don't have.  MAKING THE MOST OF OUR FOCUSED 20 MINUTE APPOINTMENTS: [x]   Clearly state your top concerns at the beginning of the visit to focus our discussion [x]   If you anticipate you will need more time, please inform the front desk during scheduling - we can book multiple appointments in the same week. [x]   If you have transportation problems- use our convenient video appointments or ask about transportation support. [x]   We can get down to business faster if you use MyChart to update information before the visit and submit non-urgent questions before your visit. Thank you for taking the time to provide details through MyChart.  Let our nurse know and she can import this information into your encounter documents.  Arrival and Wait Times: [x]   Arriving on time ensures that everyone receives prompt attention. [x]   Early morning (8a) and afternoon (  1p) appointments tend to have shortest wait times. [x]   Unfortunately, we cannot delay appointments for late arrivals or hold  slots during phone calls.  Getting Answers and Following Up [x]   Simple Questions & Concerns: For quick questions or basic follow-up after your visit, reach us  at [phone] or MyChart messaging. [x]   Complex Concerns: If your concern is more complex, scheduling an appointment might be best. Discuss this with the staff to find the most suitable option. [x]   Lab & Imaging Results: We'll contact you directly if results are abnormal or you don't use MyChart. Most normal results will be on MyChart within 2-3 business days, with a review message from Dr. Jesus. Haven't heard back in 2 weeks? Need results sooner? Contact us  at [phone]. [x]   Referrals: Our referral coordinator will manage specialist referrals. The specialist's office should contact you within 2 weeks to schedule an appointment. Call us  if you haven't heard from them after 2 weeks.  Staying Connected [x]   MyChart: Activate your MyChart for the fastest way to access results and message us . See the last page of this paperwork for instructions on how to activate.  Bring to Your Next Appointment [x]   Medications: Please bring all your medication bottles to your next appointment to ensure we have an accurate record of your prescriptions. [x]   Health Diaries: If you're monitoring any health conditions at home, keeping a diary of your readings can be very helpful for discussions at your next appointment.  Billing [x]   X-ray & Lab Orders: These are billed by separate companies. Contact the invoicing company directly for questions or concerns. [x]   Visit Charges: Discuss any billing inquiries with our administrative services team.  Your Satisfaction Matters [x]   Share Your Experience: We strive for your satisfaction! If you have any complaints, or preferably compliments, please let Dr. Jesus know directly or contact our Practice Administrators, Manuelita Rubin or [employer], by asking at the front desk.   Reviewing Your  Records [x]   Review this early draft of your clinical encounter notes below and the final encounter summary tomorrow on MyChart after its been completed.  All orders placed so far are visible here: Primary hypertension -     Tadalafil ; Take 1 tablet (10 mg total) by mouth every other day as needed for erectile dysfunction.  Dispense: 10 tablet; Refill: 1  Acute kidney injury (HCC) -     Protein / creatinine ratio, urine; Future -     Cystatin C with Glomerular Filtration Rate, Estimated (eGFR); Future -     Basic metabolic panel with GFR; Future  Resistant hypertension -     Basic metabolic panel with GFR -     Microalbumin / creatinine urine ratio  Chronic kidney disease (CKD), active medical management without dialysis, stage 3 (moderate) (HCC) -     Basic metabolic panel with GFR -     Microalbumin / creatinine urine ratio

## 2023-08-31 ENCOUNTER — Ambulatory Visit: Payer: Self-pay | Admitting: Internal Medicine

## 2023-08-31 LAB — PROTEIN / CREATININE RATIO, URINE
Creatinine, Urine: 48 mg/dL (ref 20–320)
Protein/Creat Ratio: 125 mg/g{creat} (ref 25–148)
Protein/Creatinine Ratio: 0.125 mg/mg{creat} (ref 0.025–0.148)
Total Protein, Urine: 6 mg/dL (ref 5–25)

## 2023-09-01 LAB — CYSTATIN C WITH GLOMERULAR FILTRATION RATE, ESTIMATED (EGFR)
CYSTATIN C: 1.37 mg/L — ABNORMAL HIGH (ref 0.52–1.20)
eGFR: 50 mL/min/1.73m2 — ABNORMAL LOW (ref >=60)

## 2023-09-02 DIAGNOSIS — Z0100 Encounter for examination of eyes and vision without abnormal findings: Secondary | ICD-10-CM | POA: Diagnosis not present

## 2023-09-02 DIAGNOSIS — H40023 Open angle with borderline findings, high risk, bilateral: Secondary | ICD-10-CM | POA: Diagnosis not present

## 2023-09-06 ENCOUNTER — Other Ambulatory Visit (HOSPITAL_BASED_OUTPATIENT_CLINIC_OR_DEPARTMENT_OTHER): Payer: Self-pay

## 2023-09-06 MED ORDER — AMPHETAMINE-DEXTROAMPHETAMINE 20 MG PO TABS: 20.0000 mg | ORAL_TABLET | Freq: Three times a day (TID) | ORAL | 0 refills | Status: DC

## 2023-09-06 MED ORDER — AMPHETAMINE-DEXTROAMPHETAMINE 20 MG PO TABS
20.0000 mg | ORAL_TABLET | Freq: Three times a day (TID) | ORAL | 0 refills | Status: DC
  Filled 2023-10-07: qty 90, 30d supply, fill #0

## 2023-09-06 MED ORDER — AMPHETAMINE-DEXTROAMPHETAMINE 20 MG PO TABS
20.0000 mg | ORAL_TABLET | Freq: Three times a day (TID) | ORAL | 0 refills | Status: DC
  Filled 2023-09-06: qty 90, 30d supply, fill #0

## 2023-09-13 ENCOUNTER — Ambulatory Visit: Admitting: Internal Medicine

## 2023-09-15 ENCOUNTER — Ambulatory Visit (INDEPENDENT_AMBULATORY_CARE_PROVIDER_SITE_OTHER): Admitting: Internal Medicine

## 2023-09-15 ENCOUNTER — Encounter: Payer: Self-pay | Admitting: Internal Medicine

## 2023-09-15 VITALS — BP 132/74 | HR 58 | Temp 98.0°F | Ht 67.0 in | Wt 170.6 lb

## 2023-09-15 DIAGNOSIS — M7989 Other specified soft tissue disorders: Secondary | ICD-10-CM | POA: Diagnosis not present

## 2023-09-15 DIAGNOSIS — R0609 Other forms of dyspnea: Secondary | ICD-10-CM | POA: Diagnosis not present

## 2023-09-15 DIAGNOSIS — I1A0 Resistant hypertension: Secondary | ICD-10-CM

## 2023-09-15 DIAGNOSIS — N179 Acute kidney failure, unspecified: Secondary | ICD-10-CM | POA: Diagnosis not present

## 2023-09-15 MED ORDER — SPIRONOLACTONE-HCTZ 25-25 MG PO TABS: 1.0000 | ORAL_TABLET | Freq: Every day | ORAL | 1 refills | Status: DC

## 2023-09-15 NOTE — Assessment & Plan Note (Signed)
 Blood pressure ranges from 140s to 160s systolic over 80s diastolic. He experiences lower extremity edema and exertional dyspnea. Previous ACE inhibitors and ARBs were not tolerated due to adverse renal effects, and the current regimen of amlodipine  and doxazosin  is ineffective. Beta blockers are contraindicated due to bradycardia. Edema and dyspnea may result from hypertension-induced fluid retention. Start aldactazide at a quarter tablet to lower blood pressure and reduce fluid retention, monitoring kidney function closely due to the risk of dehydration. Discontinue Cialis . Order B-natriuretic peptide test to assess heart function, a comprehensive metabolic panel (CMP) for kidney and liver function, cystatin C for accurate kidney function assessment, and urine protein creatinine ratio to check for active kidney injury. Monitor blood pressure and adjust medication dosage as needed. Consider SGLT2 inhibitor if kidney function remains stable.

## 2023-09-15 NOTE — Patient Instructions (Signed)
 It was a pleasure seeing you today! Your health and satisfaction are our top priorities.  Eric Cone, MD  VISIT SUMMARY: Today, we discussed your elevated blood pressure, medication side effects, and overall kidney health. We reviewed your recent blood pressure readings and symptoms, including swelling in your feet and ankles, and shortness of breath with exertion. We also discussed your current medications and their effectiveness.  YOUR PLAN: -RESISTANT HYPERTENSION WITH LOWER EXTREMITY EDEMA AND EXERTIONAL DYSPNEA: Resistant hypertension means your blood pressure remains high despite taking multiple medications. The swelling in your feet and ankles and shortness of breath may be due to fluid retention caused by high blood pressure. We will start you on a low dose of Aldactazide to help lower your blood pressure and reduce fluid retention. We will monitor your kidney function closely to avoid dehydration. We will also discontinue Cialis  and order several tests to assess your heart and kidney function. Please continue to monitor your blood pressure and we may adjust your medication dosage as needed.  -CHRONIC KIDNEY DISEASE, STAGE 3: Chronic kidney disease stage 3 means your kidneys are moderately damaged and not working as well as they should. We will avoid certain medications that previously caused adverse effects and will monitor your kidney function with regular blood tests.  -ACUTE KIDNEY INJURY, LIKELY RESOLVED: An acute kidney injury is a sudden episode of kidney damage. Your previous kidney injury, likely caused by olmesartan , has shown improvement. We will document this medication as causing adverse reactions for you and continue to monitor your kidney function with regular blood tests.  INSTRUCTIONS: Please follow up with the recommended blood tests: B-natriuretic peptide test, comprehensive metabolic panel (CMP), cystatin C, and urine protein creatinine ratio. Continue to monitor your  blood pressure at home and report any significant changes or new symptoms. We will adjust your medication dosage as needed based on your test results and blood pressure readings.  Your Providers PCP: Crane Eric MATSU, MD,  [phone]) Referring Provider: Cone Eric MATSU, MD,  [phone]) Care Team Provider: Jerri Kay HERO, MD,  [phone]) Care Team Provider: [name], MD,  [phone]) Care Team Provider: Persons, [name], [place],  [phone]) Care Team Provider: [name], MD,  [phone])  NEXT STEPS: [x]  Early Intervention: Schedule sooner appointment, call our on-call services, or go to emergency room if there is any significant Increase in pain or discomfort New or worsening symptoms Sudden or severe changes in your health [x]  Flexible Follow-Up: We recommend a No follow-ups on file. for optimal routine care. This allows for progress monitoring and treatment adjustments. [x]  Preventive Care: Schedule your annual preventive care visit! It's typically covered by insurance and helps identify potential health issues early. [x]  Lab & X-ray Appointments: Incomplete tests scheduled today, or call to schedule. X-rays: [facility] (M-F, 8:30am-noon or 1pm-5pm). [x]  Medical Information Release: Sign a release form at front desk to obtain relevant medical information we don't have.  MAKING THE MOST OF OUR FOCUSED 20 MINUTE APPOINTMENTS: [x]   Clearly state your top concerns at the beginning of the visit to focus our discussion [x]   If you anticipate you will need more time, please inform the front desk during scheduling - we can book multiple appointments in the same week. [x]   If you have transportation problems- use our convenient video appointments or ask about transportation support. [x]   We can get down to business faster if you use MyChart to update information before the visit and submit non-urgent questions before  your visit. Thank you for taking the  time to provide details through MyChart.  Let our nurse know and she can import this information into your encounter documents.  Arrival and Wait Times: [x]   Arriving on time ensures that everyone receives prompt attention. [x]   Early morning (8a) and afternoon (1p) appointments tend to have shortest wait times. [x]   Unfortunately, we cannot delay appointments for late arrivals or hold slots during phone calls.  Getting Answers and Following Up [x]   Simple Questions & Concerns: For quick questions or basic follow-up after your visit, reach us  at [phone] or MyChart messaging. [x]   Complex Concerns: If your concern is more complex, scheduling an appointment might be best. Discuss this with the staff to find the most suitable option. [x]   Lab & Imaging Results: We'll contact you directly if results are abnormal or you don't use MyChart. Most normal results will be on MyChart within 2-3 business days, with a review message from Dr. Jesus. Haven't heard back in 2 weeks? Need results sooner? Contact us  at [phone]. [x]   Referrals: Our referral coordinator will manage specialist referrals. The specialist's office should contact you within 2 weeks to schedule an appointment. Call us  if you haven't heard from them after 2 weeks.  Staying Connected [x]   MyChart: Activate your MyChart for the fastest way to access results and message us . See the last page of this paperwork for instructions on how to activate.  Bring to Your Next Appointment [x]   Medications: Please bring all your medication bottles to your next appointment to ensure we have an accurate record of your prescriptions. [x]   Health Diaries: If you're monitoring any health conditions at home, keeping a diary of your readings can be very helpful for discussions at your next appointment.  Billing [x]   X-ray & Lab Orders: These are billed by separate companies. Contact the invoicing company directly for questions or concerns. [x]    Visit Charges: Discuss any billing inquiries with our administrative services team.  Your Satisfaction Matters [x]   Share Your Experience: We strive for your satisfaction! If you have any complaints, or preferably compliments, please let Dr. Jesus know directly or contact our Practice Administrators, Manuelita Rubin or [employer], by asking at the front desk.   Reviewing Your Records [x]   Review this early draft of your clinical encounter notes below and the final encounter summary tomorrow on MyChart after its been completed.  All orders placed so far are visible here: Resistant hypertension -     Spironolactone -HCTZ; Take 1 tablet by mouth daily. To start just 0.25 tablet daily for 1 week.  Dispense: 90 tablet; Refill: 1 -     Comprehensive metabolic panel with GFR  AKI (acute kidney injury) (HCC) -     Protein / creatinine ratio, urine; Future -     Cystatin C with Glomerular Filtration Rate, Estimated (eGFR); Future -     Comprehensive metabolic panel with GFR  Dyspnea on exertion -     Brain natriuretic peptide  Leg swelling

## 2023-09-15 NOTE — Progress Notes (Signed)
 ==============================  [facility]: [id number]   -- Medical Office Visit --  Patient: Eric Crane      Age: 68 y.o.       Sex:  male  Date:   09/15/2023 Today's Healthcare Provider: Bernardino KANDICE Cone, MD  ==============================   Chief Complaint: Hypertension And acute kidney injury monitoring  Discussed the use of AI scribe software for clinical note transcription with the patient, who gave verbal consent to proceed.  History of Present Illness 68 year old male with hypertension and chronic kidney disease who presents with elevated blood pressure and medication side effects.  He has experienced elevated blood pressure readings ranging from 140s to 160s over 80s in the past two weeks, with occasional spikes that decrease slightly upon retesting. He has been closely monitoring his blood pressure, recording readings such as 150/85, 152/88, and 168/92.  He has a history of chronic kidney disease, with a previous GFR as low as 25, which improved to the 50s after discontinuing olmesartan . Previous attempts to use Cialis  resulted in severe side effects, including headaches and indigestion, leading to its discontinuation.  Currently, he is on amlodipine  10 mg at night and doxazosin  2 mg twice a day. He cannot take beta blockers due to a low heart rate. He has experienced swelling in his feet and ankles almost daily over the past few weeks, despite wearing compression socks.  He experiences shortness of breath with exertion, requiring breaks to catch his breath. He is on Crestor , started by his cardiologist about six months ago, and takes Adderall in the morning to counteract the sedative effects of his other medications taken at night. No chest pain or tenderness in the legs. Blood pressure 150/85 120 52/88 151/86 133/80 152/87 152/88 168/92 133/75 142/79 143/82 156/86 141/81 143/80 114 133/77 and then a bunch more from 140s to 150s over  80s and that is all in the last week   Lab Results  Component Value Date   GFR 51.03 (L) 08/30/2023   GFR 46.69 (L) 08/23/2023   GFR 25.74 (L) 08/19/2023   GFR 52.36 (L) 08/11/2023   GFR 51.92 (L) 08/05/2023   GFR 47.45 (L) 07/29/2023   GFR 32.95 (L) 07/23/2023   GFR 50.23 (L) 07/09/2023   GFR 50.24 (L) 06/30/2023   GFR 40.95 (L) 06/07/2023   GFR 46.77 (L) 05/28/2023   GFR 51.13 (L) 05/17/2023   GFR 64.40 05/10/2023   GFR 52.02 (L) 05/03/2023   GFR 39.87 (L) 04/26/2023   GFR 46.44 (L) 04/23/2023   GFR 58.44 (L) 04/09/2023   GFR 53.88 (L) 03/26/2023   GFR 61.76 03/23/2022   Lab Results  Component Value Date   EGFR 50 (L) 08/30/2023   EGFR 49 (L) 08/23/2023   EGFR 47 (L) 08/11/2023   EGFR 46 (L) 07/29/2023   EGFR 43 (L) 06/30/2023   EGFR 51.0 06/20/2023   EGFR 45 (L) 06/07/2023   EGFR 42 (L) 05/28/2023   EGFR 47 (L) 05/18/2023   EGFR 56 (L) 05/10/2023   EGFR 54 (L) 05/03/2023   EGFR 38 (L) 04/26/2023   EGFR 44 (L) 04/23/2023   EGFR 46 (L) 04/19/2023   EGFR 45 (L) 04/19/2023   EGFR 50 (L) 04/09/2023   continues on, as of 09/15/2023    Sig   amLODipine  (NORVASC ) 10 MG tablet (Taking) Take 1 tablet (10 mg total) by mouth daily.   doxazosin  (CARDURA ) 2 MG tablet Take 1 mg by mouth daily.  Patient taking differently: Take 2 mg by mouth bid   Dcd tadalafil  due to side effect(s)                   :1}    Background Reviewed: Problem List: has Primary osteoarthritis of right knee; Weight disorder; FH: heart disease; Osteoarthritis of lower back; Hyperlipidemia, acquired; Thrombocytopenia (HCC); Attention deficit hyperactivity disorder (ADHD); Bipolar 2 disorder (HCC); H/O total knee replacement; Prediabetes; Statin intolerance; Status post total right knee replacement; Pain in left hip; Medication management; Chronic kidney disease (CKD), active medical management without dialysis, stage 3 (moderate) (HCC); Hypertension; Coronary artery calcification; Elevated serum  creatinine; Nephrolithiasis; Gallstones; Injury to kidney; Elevated PSA; and Polycystic kidney disease on their problem list. Past Medical History:  has a past medical history of Arthritis (2021), Bipolar 2 disorder (HCC) (11/20/2021), Chronic kidney disease (03/26/2023), Depression, FH: heart disease (11/20/2021), Hypertension (03/25/2024), Osteoarthritis of lower back (11/20/2021), Overweight (11/20/2021), and Thrombocytopenia (HCC) (11/20/2021). Past Surgical History:   has a past surgical history that includes Colonoscopy with propofol  (N/A, 02/18/2016); Tonsillectomy; and Total knee arthroplasty (Right, 02/02/2022). Social History:   reports that he has never smoked. He has never used smokeless tobacco. He reports that he does not drink alcohol and does not use drugs. Family History:  family history includes Heart disease in his mother. Allergies:  is allergic to ace inhibitors and angiotensin receptor blockers.   Medication Reconciliation: Current Outpatient Medications on File Prior to Visit  Medication Sig   amLODipine  (NORVASC ) 10 MG tablet Take 1 tablet (10 mg total) by mouth daily.   amphetamine -dextroamphetamine  (ADDERALL) 20 MG tablet Take 20 mg by mouth 3 (three) times daily.   [START ON 11/05/2023] amphetamine -dextroamphetamine  (ADDERALL) 20 MG tablet Take 1 tablet (20 mg total) by mouth 3 (three) times daily. (11-05-23)   amphetamine -dextroamphetamine  (ADDERALL) 20 MG tablet Take 1 tablet (20 mg total) by mouth 3 (three) times daily.   [START ON 10/06/2023] amphetamine -dextroamphetamine  (ADDERALL) 20 MG tablet Take 1 tablet (20 mg total) by mouth 3 (three) times daily. (10-06-23)   aspirin  EC 81 MG tablet Take 1 tablet (81 mg total) by mouth daily. Swallow whole.   doxazosin  (CARDURA ) 1 MG tablet Take 1 mg by mouth daily. (Patient taking differently: Take 1 mg by mouth daily. 2mg  twice a day)   empagliflozin  (JARDIANCE ) 25 MG TABS tablet Take 1 tablet (25 mg total) by mouth daily before  breakfast. (Patient not taking: Reported on 08/11/2023)   lamoTRIgine  (LAMICTAL ) 200 MG tablet Take 200 mg by mouth daily.   Lurasidone HCl 120 MG TABS    QUEtiapine  Fumarate (SEROQUEL  XR) 150 MG 24 hr tablet Take 300 mg by mouth at bedtime.   rosuvastatin  (CRESTOR ) 20 MG tablet Take 1 tablet (20 mg total) by mouth daily.   tadalafil  (CIALIS ) 10 MG tablet Take 1 tablet (10 mg total) by mouth every other day as needed for erectile dysfunction. (Patient not taking: Reported on 09/15/2023)   No current facility-administered medications on file prior to visit.   Medications Discontinued During This Encounter  Medication Reason   spironolactone -hydrochlorothiazide  (ALDACTAZIDE) 25-25 MG tablet Reorder     Physical Exam:    09/15/2023    3:39 PM 09/15/2023    3:23 PM 08/30/2023   11:07 AM  Vitals with BMI  Height  5' 7 5' 7  Weight  170 lbs 10 oz 171 lbs 3 oz  BMI  26.71 26.81  Systolic 132 140 887  Diastolic 74 80 60  Pulse  58 64  Vital signs reviewed.  Nursing notes reviewed. Weight trend reviewed. Physical Activity: Sufficiently Active (07/08/2023)   Exercise Vital Sign    Days of Exercise per Week: 5 days    Minutes of Exercise per Session: 40 min   General Appearance:  No acute distress appreciable.   Well-groomed, healthy-appearing male.  Well proportioned with no abnormal fat distribution.  Good muscle tone. Pulmonary:  Normal work of breathing at rest, no respiratory distress apparent. SpO2: 98 %  Musculoskeletal: All extremities are intact.  Neurological:  Awake, alert, oriented, and engaged.  No obvious focal neurological deficits or cognitive impairments.  Sensorium seems unclouded.   Speech is clear and coherent with logical content. Psychiatric:  Appropriate mood, pleasant and cooperative demeanor, thoughtful and engaged during the exam   Verbalized to patient: Physical Exam VITALS: BP- 150/85   Results:    08/11/2023    8:04 AM 06/30/2023    8:48 AM 04/09/2023     8:47 AM 03/26/2023    8:36 AM  PHQ 2/9 Scores  PHQ - 2 Score 0 0 0 0    Verbalized to patient: Results LABS GFR: 50s (08/30/2023)  RADIOLOGY Heart CT: Minimal plaque    Office Visit on 08/30/2023  Component Date Value Ref Range Status   CYSTATIN C 08/30/2023 1.37 (H)  0.52 - 1.20 mg/L Final   eGFR 08/30/2023 50 (L)  >=60 mL/min/1.61m2 Final   Creatinine, Urine 08/30/2023 48  20 - 320 mg/dL Final   Protein/Creat Ratio 08/30/2023 125  25 - 148 mg/g creat Final   Protein/Creatinine Ratio 08/30/2023 0.125  0.025 - 0.148 mg/mg creat Final   Total Protein, Urine 08/30/2023 6  5 - 25 mg/dL Final   Sodium 91/95/7974 138  135 - 145 mEq/L Final   Potassium 08/30/2023 4.3  3.5 - 5.1 mEq/L Final   Chloride 08/30/2023 105  96 - 112 mEq/L Final   CO2 08/30/2023 24  19 - 32 mEq/L Final   Glucose, Bld 08/30/2023 105 (H)  70 - 99 mg/dL Final   BUN 91/95/7974 24 (H)  6 - 23 mg/dL Final   Creatinine, Ser 08/30/2023 1.43  0.40 - 1.50 mg/dL Final   GFR 91/95/7974 51.03 (L)  >60.00 mL/min Final   Calcium  08/30/2023 9.4  8.4 - 10.5 mg/dL Final   Microalb, Ur 91/95/7974 <0.7  mg/dL Final   Creatinine,U 91/95/7974 46.9  mg/dL Final   Microalb Creat Ratio 08/30/2023 Unable to calculate  0.0 - 30.0 mg/g Final  Lab on 08/23/2023  Component Date Value Ref Range Status   Phosphorus 08/23/2023 4.0  2.3 - 4.6 mg/dL Final   Magnesium 92/71/7974 2.3  1.5 - 2.5 mg/dL Final   WBC 92/71/7974 4.6  4.0 - 10.5 K/uL Final   RBC 08/23/2023 4.66  4.22 - 5.81 Mil/uL Final   Hemoglobin 08/23/2023 13.8  13.0 - 17.0 g/dL Final   HCT 92/71/7974 41.5  39.0 - 52.0 % Final   MCV 08/23/2023 89.1  78.0 - 100.0 fl Final   MCHC 08/23/2023 33.3  30.0 - 36.0 g/dL Final   RDW 92/71/7974 13.5  11.5 - 15.5 % Final   Platelets 08/23/2023 150.0  150.0 - 400.0 K/uL Final   Neutrophils Relative % 08/23/2023 45.4  43.0 - 77.0 % Final   Lymphocytes Relative 08/23/2023 29.0  12.0 - 46.0 % Final   Monocytes Relative 08/23/2023 16.3  (H)  3.0 - 12.0 % Final   Eosinophils Relative 08/23/2023 7.8 (H)  0.0 - 5.0 %  Final   Basophils Relative 08/23/2023 1.5  0.0 - 3.0 % Final   Neutro Abs 08/23/2023 2.1  1.4 - 7.7 K/uL Final   Lymphs Abs 08/23/2023 1.3  0.7 - 4.0 K/uL Final   Monocytes Absolute 08/23/2023 0.7  0.1 - 1.0 K/uL Final   Eosinophils Absolute 08/23/2023 0.4  0.0 - 0.7 K/uL Final   Basophils Absolute 08/23/2023 0.1  0.0 - 0.1 K/uL Final   Sodium 08/23/2023 136  135 - 145 mEq/L Final   Potassium 08/23/2023 4.5  3.5 - 5.1 mEq/L Final   Chloride 08/23/2023 104  96 - 112 mEq/L Final   CO2 08/23/2023 25  19 - 32 mEq/L Final   Glucose, Bld 08/23/2023 124 (H)  70 - 99 mg/dL Final   BUN 92/71/7974 25 (H)  6 - 23 mg/dL Final   Creatinine, Ser 08/23/2023 1.54 (H)  0.40 - 1.50 mg/dL Final   Total Bilirubin 08/23/2023 0.5  0.2 - 1.2 mg/dL Final   Alkaline Phosphatase 08/23/2023 89  39 - 117 U/L Final   AST 08/23/2023 17  0 - 37 U/L Final   ALT 08/23/2023 16  0 - 53 U/L Final   Total Protein 08/23/2023 6.8  6.0 - 8.3 g/dL Final   Albumin 92/71/7974 4.4  3.5 - 5.2 g/dL Final   GFR 92/71/7974 46.69 (L)  >60.00 mL/min Final   Calcium  08/23/2023 9.1  8.4 - 10.5 mg/dL Final   CYSTATIN C 92/71/7974 1.39 (H)  0.52 - 1.20 mg/L Final   eGFR 08/23/2023 49 (L)  >=60 mL/min/1.4m2 Final   Color, Urine 08/23/2023 YELLOW  YELLOW Final   APPearance 08/23/2023 CLEAR  CLEAR Final   Specific Gravity, Urine 08/23/2023 1.019  1.001 - 1.035 Final   pH 08/23/2023 6.0  5.0 - 8.0 Final   Glucose, UA 08/23/2023 3+ (A)  NEGATIVE Final   Bilirubin Urine 08/23/2023 NEGATIVE  NEGATIVE Final   Ketones, ur 08/23/2023 NEGATIVE  NEGATIVE Final   Hgb urine dipstick 08/23/2023 NEGATIVE  NEGATIVE Final   Protein, ur 08/23/2023 NEGATIVE  NEGATIVE Final   Nitrites, Initial 08/23/2023 NEGATIVE  NEGATIVE Final   Leukocyte Esterase 08/23/2023 NEGATIVE  NEGATIVE Final   WBC, UA 08/23/2023 NONE SEEN  0 - 5 /HPF Final   RBC / HPF 08/23/2023 NONE SEEN  0 - 2  /HPF Final   Squamous Epithelial / HPF 08/23/2023 0-5  < OR = 5 /HPF Final   Bacteria, UA 08/23/2023 NONE SEEN  NONE SEEN /HPF Final   Hyaline Cast 08/23/2023 NONE SEEN  NONE SEEN /LPF Final   Note 08/23/2023    Final   Creatinine, Urine 08/23/2023 84  20 - 320 mg/dL Final   Protein/Creat Ratio 08/23/2023 190 (H)  25 - 148 mg/g creat Final   Protein/Creatinine Ratio 08/23/2023 0.190 (H)  0.025 - 0.148 mg/mg creat Final   Total Protein, Urine 08/23/2023 16  5 - 25 mg/dL Final   Microalb, Ur 92/71/7974 0.9  0.0 - 1.9 mg/dL Final   Creatinine,U 92/71/7974 81.1  mg/dL Final   Microalb Creat Ratio 08/23/2023 11.2  0.0 - 30.0 mg/g Final   Reflexve Urine Culture 08/23/2023    Final  Lab on 08/19/2023  Component Date Value Ref Range Status   Sodium 08/19/2023 131 (L)  135 - 145 mEq/L Final   Potassium 08/19/2023 4.7  3.5 - 5.1 mEq/L Final   Chloride 08/19/2023 99  96 - 112 mEq/L Final   CO2 08/19/2023 21  19 - 32 mEq/L Final  Glucose, Bld 08/19/2023 110 (H)  70 - 99 mg/dL Final   BUN 92/75/7974 38 (H)  6 - 23 mg/dL Final   Creatinine, Ser 08/19/2023 2.53 (H)  0.40 - 1.50 mg/dL Final   GFR 92/75/7974 25.74 (L)  >60.00 mL/min Final   Calcium  08/19/2023 9.1  8.4 - 10.5 mg/dL Final   Microalb, Ur 92/75/7974 2.6 (H)  0.0 - 1.9 mg/dL Final   Creatinine,U 92/75/7974 140.6  mg/dL Final   Microalb Creat Ratio 08/19/2023 18.2  0.0 - 30.0 mg/g Final  Office Visit on 08/11/2023  Component Date Value Ref Range Status   Sodium 08/11/2023 137  135 - 145 mEq/L Final   Potassium 08/11/2023 4.3  3.5 - 5.1 mEq/L Final   Chloride 08/11/2023 105  96 - 112 mEq/L Final   CO2 08/11/2023 23  19 - 32 mEq/L Final   Glucose, Bld 08/11/2023 101 (H)  70 - 99 mg/dL Final   BUN 92/83/7974 24 (H)  6 - 23 mg/dL Final   Creatinine, Ser 08/11/2023 1.40  0.40 - 1.50 mg/dL Final   GFR 92/83/7974 52.36 (L)  >60.00 mL/min Final   Calcium  08/11/2023 9.4  8.4 - 10.5 mg/dL Final   CYSTATIN C 92/83/7974 1.43 (H)  0.52 - 1.20  mg/L Final   eGFR 08/11/2023 47 (L)  >=60 mL/min/1.74m2 Final   Creatinine, Urine 08/11/2023 28  20 - 320 mg/dL Final   Protein/Creat Ratio 08/11/2023 NOTE  25 - 148 mg/g creat Final   Protein/Creatinine Ratio 08/11/2023 NOTE  0.025 - 0.148 mg/mg creat Final   Total Protein, Urine 08/11/2023 <4 (L)  5 - 25 mg/dL Final  Lab on 92/89/7974  Component Date Value Ref Range Status   Sodium 08/05/2023 134 (L)  135 - 145 mEq/L Final   Potassium 08/05/2023 4.4  3.5 - 5.1 mEq/L Final   Chloride 08/05/2023 104  96 - 112 mEq/L Final   CO2 08/05/2023 24  19 - 32 mEq/L Final   Glucose, Bld 08/05/2023 143 (H)  70 - 99 mg/dL Final   BUN 92/89/7974 22  6 - 23 mg/dL Final   Creatinine, Ser 08/05/2023 1.41  0.40 - 1.50 mg/dL Final   GFR 92/89/7974 51.92 (L)  >60.00 mL/min Final   Calcium  08/05/2023 9.1  8.4 - 10.5 mg/dL Final   Microalb, Ur 92/89/7974 2.1 (H)  0.0 - 1.9 mg/dL Final   Creatinine,U 92/89/7974 121.0  mg/dL Final   Microalb Creat Ratio 08/05/2023 17.2  0.0 - 30.0 mg/g Final  Appointment on 07/29/2023  Component Date Value Ref Range Status   Microalb, Ur 07/29/2023 1.2  0.0 - 1.9 mg/dL Final   Creatinine,U 92/96/7974 106.0  mg/dL Final   Microalb Creat Ratio 07/29/2023 11.3  0.0 - 30.0 mg/g Final   Sodium 07/29/2023 139  135 - 145 mEq/L Final   Potassium 07/29/2023 4.9  3.5 - 5.1 mEq/L Final   Chloride 07/29/2023 106  96 - 112 mEq/L Final   CO2 07/29/2023 27  19 - 32 mEq/L Final   Glucose, Bld 07/29/2023 130 (H)  70 - 99 mg/dL Final   BUN 92/96/7974 25 (H)  6 - 23 mg/dL Final   Creatinine, Ser 07/29/2023 1.52 (H)  0.40 - 1.50 mg/dL Final   GFR 92/96/7974 47.45 (L)  >60.00 mL/min Final   Calcium  07/29/2023 9.4  8.4 - 10.5 mg/dL Final   CYSTATIN C 92/96/7974 1.45 (H)  0.52 - 1.20 mg/L Final   eGFR 07/29/2023 46 (L)  >=60 mL/min/1.24m2 Final  Creatinine, Urine 07/29/2023 106  20 - 320 mg/dL Final   Protein/Creat Ratio 07/29/2023 170 (H)  25 - 148 mg/g creat Final   Protein/Creatinine  Ratio 07/29/2023 0.170 (H)  0.025 - 0.148 mg/mg creat Final   Total Protein, Urine 07/29/2023 18  5 - 25 mg/dL Final  Lab on 93/72/7974  Component Date Value Ref Range Status   Sodium 07/23/2023 133 (L)  135 - 145 mEq/L Final   Potassium 07/23/2023 5.2 No hemolysis seen (H)  3.5 - 5.1 mEq/L Final   Chloride 07/23/2023 105  96 - 112 mEq/L Final   CO2 07/23/2023 22  19 - 32 mEq/L Final   Glucose, Bld 07/23/2023 102 (H)  70 - 99 mg/dL Final   BUN 93/72/7974 41 (H)  6 - 23 mg/dL Final   Creatinine, Ser 07/23/2023 2.06 (H)  0.40 - 1.50 mg/dL Final   GFR 93/72/7974 32.95 (L)  >60.00 mL/min Final   Calcium  07/23/2023 9.3  8.4 - 10.5 mg/dL Final   Microalb, Ur 93/72/7974 2.0 (H)  0.0 - 1.9 mg/dL Final   Creatinine,U 93/72/7974 89.3  mg/dL Final   Microalb Creat Ratio 07/23/2023 22.4  0.0 - 30.0 mg/g Final  Office Visit on 07/09/2023  Component Date Value Ref Range Status   Sodium 07/09/2023 138  135 - 145 mEq/L Final   Potassium 07/09/2023 4.1  3.5 - 5.1 mEq/L Final   Chloride 07/09/2023 105  96 - 112 mEq/L Final   CO2 07/09/2023 24  19 - 32 mEq/L Final   Glucose, Bld 07/09/2023 95  70 - 99 mg/dL Final   BUN 93/86/7974 24 (H)  6 - 23 mg/dL Final   Creatinine, Ser 07/09/2023 1.45  0.40 - 1.50 mg/dL Final   GFR 93/86/7974 50.23 (L)  >60.00 mL/min Final   Calcium  07/09/2023 9.0  8.4 - 10.5 mg/dL Final   Microalb, Ur 93/86/7974 <0.7  mg/dL Final   Creatinine,U 93/86/7974 56.7  mg/dL Final   Microalb Creat Ratio 07/09/2023 Unable to calculate  0.0 - 30.0 mg/g Final  Lab on 06/30/2023  Component Date Value Ref Range Status   CYSTATIN C 06/30/2023 1.54 (H)  0.52 - 1.20 mg/L Final   eGFR 06/30/2023 43 (L)  >=60 mL/min/1.69m2 Final   Microalb, Ur 06/30/2023 <0.7  mg/dL Final   Creatinine,U 93/95/7974 48.8  mg/dL Final   Microalb Creat Ratio 06/30/2023 Unable to calculate  0.0 - 30.0 mg/g Final   Sodium 06/30/2023 138  135 - 145 mEq/L Final   Potassium 06/30/2023 4.4  3.5 - 5.1 mEq/L Final    Chloride 06/30/2023 107  96 - 112 mEq/L Final   CO2 06/30/2023 25  19 - 32 mEq/L Final   Glucose, Bld 06/30/2023 99  70 - 99 mg/dL Final   BUN 93/95/7974 25 (H)  6 - 23 mg/dL Final   Creatinine, Ser 06/30/2023 1.45  0.40 - 1.50 mg/dL Final   GFR 93/95/7974 50.24 (L)  >60.00 mL/min Final   Calcium  06/30/2023 9.3  8.4 - 10.5 mg/dL Final  Scanned Document on 06/22/2023  Component Date Value Ref Range Status   EGFR 06/20/2023 51.0   Final   Creatinine, POC 06/20/2023 75.4  mg/dL Final   Albumin, Urine POC 06/20/2023 5.3   Final   Microalb Creat Ratio 06/20/2023 7   Final  There may be more visits with results that are not included.  No image results found. US  RENAL ARTERY DUPLEX COMPLETE Result Date: 06/24/2023 CLINICAL DATA:  aki, ckd, intolerant of arb (  raises creatinine) eval for renal artery stenosis. EXAM: RENAL/URINARY TRACT ULTRASOUND RENAL DUPLEX DOPPLER ULTRASOUND COMPARISON:  CT renal stone protocol 05/05/2023. Renal ultrasound 04/28/2023. FINDINGS: Right Kidney: Length: 9.1 cm. Increased echogenicity. No mass or hydronephrosis visualized. Simple cortical cysts measuring up to 2.8 cm. Left Kidney: Length: 12 cm. Increased echogenicity. No mass or hydronephrosis visualized. Simple cortical cysts measuring up to 4.2 cm. Bladder:  Unremarkable RENAL DUPLEX ULTRASOUND Right Renal Artery Velocities: Origin:  130 cm/sec Mid:  89 cm/sec Hilum:  120 cm/sec Interlobar:  62 cm/sec Arcuate:  33 cm/sec Left Renal Artery Velocities: Origin:  110 cm/sec Mid:  85 cm/sec Hilum:  69 cm/sec Interlobar:  35 cm/sec Arcuate:  26 cm/sec Aortic Velocity:  97 cm/sec Right Renal-Aortic Ratios: Origin: 1.3 Mid:  0.9 Hilum: 1.2 Interlobar: 0.6 Arcuate: 0.3 Left Renal-Aortic Ratios: Origin: 1.1 Mid: 0.9 Hilum: 0.7 Interlobar: 0.36 Arcuate: 0.27 IMPRESSION: 1. No Doppler evidence of renal artery stenosis. 2. Increased renal echogenicity consistent with medical renal disease. No hydronephrosis. Electronically Signed   By:  Gwynneth Akin M.D.   On: 06/24/2023 12:29         ASSESSMENT & PLAN   Assessment & Plan Resistant hypertension Blood pressure ranges from 140s to 160s systolic over 80s diastolic. He experiences lower extremity edema and exertional dyspnea. Previous ACE inhibitors and ARBs were not tolerated due to adverse renal effects, and the current regimen of amlodipine  and doxazosin  is ineffective. Beta blockers are contraindicated due to bradycardia. Edema and dyspnea may result from hypertension-induced fluid retention. Start aldactazide at a quarter tablet to lower blood pressure and reduce fluid retention, monitoring kidney function closely due to the risk of dehydration. Discontinue Cialis . Order B-natriuretic peptide test to assess heart function, a comprehensive metabolic panel (CMP) for kidney and liver function, cystatin C for accurate kidney function assessment, and urine protein creatinine ratio to check for active kidney injury. Monitor blood pressure and adjust medication dosage as needed. Consider SGLT2 inhibitor if kidney function remains stable. AKI (acute kidney injury) (HCC) Acute kidney injury likely resolved, previously associated with olmesartan  use. Current kidney function tests show improvement. Document olmesartan  as a non-allergy adverse reaction to prevent future use. Monitor kidney function with CMP and cystatin C.  Recent acute on Chronic kidney disease stage 3 with previous GFR improvement after discontinuing olmesartan . Current kidney function requires monitoring due to changes in antihypertensive therapy. Avoid ACE inhibitors and ARBs due to previous adverse effects. Monitor kidney function with CMP and cystatin C. Dyspnea on exertion Leg swelling Check BNP Patient reports there is no pain in calfs   ORDER ASSOCIATIONS  #   DIAGNOSIS / CONDITION ICD-10 ENCOUNTER ORDER     ICD-10-CM   1. Resistant hypertension  I1A.0 spironolactone -hydrochlorothiazide  (ALDACTAZIDE) 25-25  MG tablet    Comprehensive metabolic panel with GFR    2. AKI (acute kidney injury) (HCC)  N17.9 Protein / creatinine ratio, urine    Cystatin C with Glomerular Filtration Rate, Estimated (eGFR)    Comprehensive metabolic panel with GFR    Protein / creatinine ratio, urine    CANCELED: Basic metabolic panel with GFR    3. Dyspnea on exertion  R06.09 B Nat Peptide    4. Leg swelling  M79.89            Orders Placed in Encounter:   Lab Orders         Protein / creatinine ratio, urine         Cystatin C with Glomerular Filtration Rate,  Estimated (eGFR)         Comprehensive metabolic panel with GFR         B Nat Peptide     Imaging Orders  No imaging studies ordered today   Referral Orders  No referral(s) requested today   Meds ordered this encounter  Medications   spironolactone -hydrochlorothiazide  (ALDACTAZIDE) 25-25 MG tablet    Sig: Take 1 tablet by mouth daily. To start just 0.25 tablet daily for 1 week.    Dispense:  90 tablet    Refill:  1        This document was synthesized by artificial intelligence ([employer]) using HIPAA-compliant recording of the clinical interaction;   We discussed the use of AI scribe software for clinical note transcription with the patient, who gave verbal consent to proceed. additional Info: This encounter employed state-of-the-art, real-time, collaborative documentation. The patient actively reviewed and assisted in updating their electronic medical record on a shared screen, ensuring transparency and facilitating joint problem-solving for the problem list, overview, and plan. This approach promotes accurate, informed care. The treatment plan was discussed and reviewed in detail, including medication safety, potential side effects, and all patient questions. We confirmed understanding and comfort with the plan. Follow-up instructions were established, including contacting the office for any concerns, returning if symptoms worsen, persist, or new  symptoms develop, and precautions for potential emergency department visits.

## 2023-09-16 ENCOUNTER — Ambulatory Visit: Payer: Self-pay | Admitting: Internal Medicine

## 2023-09-16 LAB — COMPREHENSIVE METABOLIC PANEL WITH GFR
ALT: 18 U/L (ref 0–53)
AST: 20 U/L (ref 0–37)
Albumin: 4.6 g/dL (ref 3.5–5.2)
Alkaline Phosphatase: 96 U/L (ref 39–117)
BUN: 20 mg/dL (ref 6–23)
CO2: 23 meq/L (ref 19–32)
Calcium: 9.3 mg/dL (ref 8.4–10.5)
Chloride: 103 meq/L (ref 96–112)
Creatinine, Ser: 1.42 mg/dL (ref 0.40–1.50)
GFR: 51.44 mL/min — ABNORMAL LOW (ref >60.00)
Glucose, Bld: 93 mg/dL (ref 70–99)
Potassium: 4.9 meq/L (ref 3.5–5.1)
Sodium: 135 meq/L (ref 135–145)
Total Bilirubin: 0.4 mg/dL (ref 0.2–1.2)
Total Protein: 7.5 g/dL (ref 6.0–8.3)

## 2023-09-16 LAB — PROTEIN / CREATININE RATIO, URINE
Creatinine, Urine: 15 mg/dL — ABNORMAL LOW (ref 20–320)
Total Protein, Urine: <4 mg/dL — ABNORMAL LOW (ref 5–25)

## 2023-09-16 LAB — BRAIN NATRIURETIC PEPTIDE: Pro B Natriuretic peptide (BNP): 45 pg/mL (ref 0.0–100.0)

## 2023-09-16 NOTE — Progress Notes (Signed)
 Reviewed results, sent patient message

## 2023-09-21 ENCOUNTER — Ambulatory Visit (INDEPENDENT_AMBULATORY_CARE_PROVIDER_SITE_OTHER): Payer: Medicare HMO

## 2023-09-21 VITALS — BP 126/70 | HR 55 | Temp 97.9°F | Ht 67.0 in | Wt 168.0 lb

## 2023-09-21 DIAGNOSIS — Z0000 Encounter for general adult medical examination without abnormal findings: Secondary | ICD-10-CM | POA: Diagnosis not present

## 2023-09-21 NOTE — Progress Notes (Signed)
 Subjective:   Layton Naves is a 68 y.o. who presents for a Medicare Wellness preventive visit.  As a reminder, Annual Wellness Visits don't include a physical exam, and some assessments may be limited, especially if this visit is performed virtually. We may recommend an in-person follow-up visit with your provider if needed.  Visit Complete: In person    Persons Participating in Visit: Patient.  AWV Questionnaire: Yes: Patient Medicare AWV questionnaire was completed by the patient on 09/20/23; I have confirmed that all information answered by patient is correct and no changes since this date.  Cardiac Risk Factors include: advanced age (>71men, >62 women);dyslipidemia;male gender;hypertension     Objective:    Today's Vitals   09/21/23 0839  BP: 126/70  Pulse: (!) 55  Temp: 97.9 F (36.6 C)  SpO2: 99%  Weight: 168 lb (76.2 kg)  Height: 5' 7 (1.702 m)   Body mass index is 26.31 kg/m.     09/21/2023    8:47 AM 05/04/2023    8:56 PM 02/02/2022    8:43 AM 01/21/2022    8:37 AM 12/17/2019    4:09 PM 02/18/2016   12:28 PM 02/17/2016   12:07 PM  Advanced Directives  Does Patient Have a Medical Advance Directive? No No No No No No  No   Copy of Healthcare Power of Attorney in Chart?      No - copy requested  No - copy requested   Would patient like information on creating a medical advance directive? No - Patient declined  No - Patient declined Yes (MAU/Ambulatory/Procedural Areas - Information given) No - Patient declined No - Patient declined       Data saved with a previous flowsheet row definition    Current Medications (verified) Outpatient Encounter Medications as of 09/21/2023  Medication Sig   amLODipine  (NORVASC ) 10 MG tablet Take 1 tablet (10 mg total) by mouth daily.   [START ON 10/06/2023] amphetamine -dextroamphetamine  (ADDERALL) 20 MG tablet Take 1 tablet (20 mg total) by mouth 3 (three) times daily. (10-06-23)   aspirin  EC 81 MG tablet Take 1 tablet (81 mg  total) by mouth daily. Swallow whole.   doxazosin  (CARDURA ) 1 MG tablet Take 1 mg by mouth daily.   empagliflozin  (JARDIANCE ) 25 MG TABS tablet Take 1 tablet (25 mg total) by mouth daily before breakfast.   lamoTRIgine  (LAMICTAL ) 200 MG tablet Take 200 mg by mouth daily.   Lurasidone HCl 120 MG TABS    QUEtiapine  Fumarate (SEROQUEL  XR) 150 MG 24 hr tablet Take 300 mg by mouth at bedtime.   rosuvastatin  (CRESTOR ) 20 MG tablet Take 1 tablet (20 mg total) by mouth daily.   spironolactone -hydrochlorothiazide  (ALDACTAZIDE) 25-25 MG tablet Take 1 tablet by mouth daily. To start just 0.25 tablet daily for 1 week.   [DISCONTINUED] amphetamine -dextroamphetamine  (ADDERALL) 20 MG tablet Take 20 mg by mouth 3 (three) times daily.   [DISCONTINUED] amphetamine -dextroamphetamine  (ADDERALL) 20 MG tablet Take 1 tablet (20 mg total) by mouth 3 (three) times daily. (11-05-23)   [DISCONTINUED] amphetamine -dextroamphetamine  (ADDERALL) 20 MG tablet Take 1 tablet (20 mg total) by mouth 3 (three) times daily.   [DISCONTINUED] tadalafil  (CIALIS ) 10 MG tablet Take 1 tablet (10 mg total) by mouth every other day as needed for erectile dysfunction. (Patient not taking: Reported on 09/15/2023)   No facility-administered encounter medications on file as of 09/21/2023.    Allergies (verified) Ace inhibitors and Angiotensin receptor blockers   History: Past Medical History:  Diagnosis Date  Arthritis 2021   Bipolar 2 disorder (HCC) 12-08-2021   Chronic kidney disease 03/26/2023   Depression    FH: heart disease 08-Dec-2021   Aunt died early 40s of some weird heart thing 3 aunts total died in 18s of heart problems Hasn't spoken to father since 1979 so doesn't know that history but knows they have high longevity.    Hypertension 03/25/2024   Osteoarthritis of lower back 12-08-2021   MRI 2021 Rarely ever hurts  T12- L1: Asymmetric leftward disc narrowing and bulging with endplate ridging. Asymmetric left facet spurring.  Moderate left foraminal narrowing. Patent spinal canal. Bulky left far-lateral spur deforming the upper psoas.   L1-L2: Asymmetric leftward disc narrowing and endplate ridging with bulging. Moderate left foraminal narrowing. The canal is patent   L2-L3: Asymmet   Overweight 12/08/2021   Thrombocytopenia (HCC) Dec 08, 2021   Past Surgical History:  Procedure Laterality Date   COLONOSCOPY WITH PROPOFOL  N/A 02/18/2016   Procedure: COLONOSCOPY WITH PROPOFOL ;  Surgeon: Gladis MARLA Louder, MD;  Location: [facility];  Service: Endoscopy;  Laterality: N/A;   TONSILLECTOMY     removed as a child   TOTAL KNEE ARTHROPLASTY Right 02/02/2022   Procedure: RIGHT TOTAL KNEE ARTHROPLASTY;  Surgeon: Jerri Kay HERO, MD;  Location: [facility] OR;  Service: Orthopedics;  Laterality: Right;   Family History  Problem Relation Age of Onset   Heart disease Mother        My mother and 2 sisters all had heart disease   Social History   Socioeconomic History   Marital status: Married    Spouse name: Not on file   Number of children: 0   Years of education: Not on file   Highest education level: Some college, no degree  Occupational History   Occupation: Cytogeneticist  Tobacco Use   Smoking status: Never   Smokeless tobacco: Never  Vaping Use   Vaping status: Never Used  Substance and Sexual Activity   Alcohol use: No   Drug use: No   Sexual activity: Not on file  Other Topics Concern   Not on file  Social History Narrative   Not on file   Social Drivers of Health   Financial Resource Strain: Low Risk  (09/21/2023)   Overall Financial Resource Strain (CARDIA)    Difficulty of Paying Living Expenses: Not hard at all  Food Insecurity: No Food Insecurity (09/21/2023)   Hunger Vital Sign    Worried About Running Out of Food in the Last Year: Never true    Ran Out of Food in the Last Year: Never true  Transportation Needs: No Transportation Needs (09/21/2023)   PRAPARE - Scientist, research (physical sciences) (Medical): No    Lack of Transportation (Non-Medical): No  Physical Activity: Sufficiently Active (09/21/2023)   Exercise Vital Sign    Days of Exercise per Week: 5 days    Minutes of Exercise per Session: 40 min  Stress: No Stress Concern Present (09/21/2023)   [employer] of Occupational Health - Occupational Stress Questionnaire    Feeling of Stress: Not at all  Recent Concern: Stress - Stress Concern Present (07/08/2023)   [employer] of Occupational Health - Occupational Stress Questionnaire    Feeling of Stress: To some extent  Social Connections: Moderately Isolated (09/21/2023)   Social Connection and Isolation Panel    Frequency of Communication with Friends and Family: More than three times a week    Frequency of Social Gatherings with Friends and Family:  More than three times a week    Attends Religious Services: Never    Active Member of Clubs or Organizations: No    Attends Banker Meetings: Never    Marital Status: Married    Tobacco Counseling Counseling given: Not Answered    Clinical Intake:  Pre-visit preparation completed: Yes  Pain : No/denies pain     BMI - recorded: 26.31 Nutritional Status: BMI 25 -29 Overweight Nutritional Risks: None Diabetes: No  Lab Results  Component Value Date   HGBA1C 6.1 03/26/2023   HGBA1C 6.1 03/23/2022   HGBA1C 6.3 03/11/2021     How often do you need to have someone help you when you read instructions, pamphlets, or other written materials from your doctor or pharmacy?: 1 - Never  Interpreter Needed?: No  Information entered by :: Ellouise Haws, LPN   Activities of Daily Living     09/20/2023   10:37 AM  In your present state of health, do you have any difficulty performing the following activities:  Hearing? 0  Vision? 0  Difficulty concentrating or making decisions? 0  Walking or climbing stairs? 0  Dressing or bathing? 0  Doing errands, shopping? 0  Preparing  Food and eating ? N  Using the Toilet? N  In the past six months, have you accidently leaked urine? N  Do you have problems with loss of bowel control? N  Managing your Medications? N  Managing your Finances? N  Housekeeping or managing your Housekeeping? N    Patient Care Team: Jesus Bernardino MATSU, MD as PCP - General (Internal Medicine) Jerri Kay HERO, MD as Attending Physician (Orthopedic Surgery) [name], MD as Consulting Physician (Psychiatry) Persons, [name], [place] (Orthopedic Surgery) [name], MD as Consulting Physician (Nephrology)  I have updated your Care Teams any recent Medical Services you may have received from other providers in the past year.     Assessment:   This is a routine wellness examination for [facility].  Hearing/Vision screen Hearing Screening - Comments:: Pt denies any hearing issues  Vision Screening - Comments:: Wears rx glasses - up to date with routine eye exams with Dr Abigail    Goals Addressed             This Visit's Progress    Patient Stated       Maintain health and activity        Depression Screen     09/21/2023    8:47 AM 08/11/2023    8:04 AM 06/30/2023    8:48 AM 04/09/2023    8:47 AM 03/26/2023    8:36 AM 03/23/2022    9:53 AM 11/20/2021    8:51 AM  PHQ 2/9 Scores  PHQ - 2 Score 0 0 0 0 0 0 0    Fall Risk     09/20/2023   10:37 AM 08/11/2023    8:03 AM 04/09/2023    8:47 AM 03/26/2023    8:36 AM 03/23/2022    9:17 AM  Fall Risk   Falls in the past year? 0 0 0 0 0  Number falls in past yr: 0 0 0 0 0  Injury with Fall? 0 0 0 0 0  Risk for fall due to : No Fall Risks No Fall Risks No Fall Risks No Fall Risks No Fall Risks  Follow up Falls prevention discussed Falls evaluation completed Falls evaluation completed Falls evaluation completed Falls evaluation completed    MEDICARE RISK AT HOME:  Medicare Risk at Home Any stairs in or around the home?: (Patient-Rptd) Yes If so, are there any without handrails?:  (Patient-Rptd) No Home free of loose throw rugs in walkways, pet beds, electrical cords, etc?: (Patient-Rptd) Yes Adequate lighting in your home to reduce risk of falls?: (Patient-Rptd) Yes Life alert?: (Patient-Rptd) No Use of a cane, walker or w/c?: (Patient-Rptd) No Grab bars in the bathroom?: (Patient-Rptd) No Shower chair or bench in shower?: (Patient-Rptd) No Elevated toilet seat or a handicapped toilet?: (Patient-Rptd) No  TIMED UP AND GO:  Was the test performed?  Yes  Length of time to ambulate 10 feet: 10 sec Gait steady and fast without use of assistive device  Cognitive Function: 6CIT completed        09/21/2023    8:49 AM  6CIT Screen  What Year? 0 points  What month? 0 points  What time? 0 points  Count back from 20 0 points  Months in reverse 0 points  Repeat phrase 0 points  Total Score 0 points    Immunizations Immunization History  Administered Date(s) Administered   Tdap 12/17/2019    Screening Tests Health Maintenance  Topic Date Due   INFLUENZA VACCINE  08/27/2023   Medicare Annual Wellness (AWV)  09/20/2024   Colonoscopy  02/17/2026   DTaP/Tdap/Td (2 - Td or Tdap) 12/16/2029   HPV VACCINES  Aged Out   Meningococcal B Vaccine  Aged Out   Pneumococcal Vaccine: 50+ Years  Discontinued   COVID-19 Vaccine  Discontinued   Hepatitis C Screening  Discontinued   Zoster Vaccines- Shingrix  Discontinued    Health Maintenance  Health Maintenance Due  Topic Date Due   INFLUENZA VACCINE  08/27/2023   Health Maintenance Items Addressed: See Nurse Notes at the end of this note  Additional Screening:  Vision Screening: Recommended annual ophthalmology exams for early detection of glaucoma and other disorders of the eye. Would you like a referral to an eye doctor? No    Dental Screening: Recommended annual dental exams for proper oral hygiene  Community Resource Referral / Chronic Care Management: CRR required this visit?  No   CCM required  this visit?  No   Plan:    I have personally reviewed and noted the following in the patient's chart:   Medical and social history Use of alcohol, tobacco or illicit drugs  Current medications and supplements including opioid prescriptions. Patient is not currently taking opioid prescriptions. Functional ability and status Nutritional status Physical activity Advanced directives List of other physicians Hospitalizations, surgeries, and ER visits in previous 12 months Vitals Screenings to include cognitive, depression, and falls Referrals and appointments  In addition, I have reviewed and discussed with patient certain preventive protocols, quality metrics, and best practice recommendations. A written personalized care plan for preventive services as well as general preventive health recommendations were provided to patient.   Ellouise VEAR Haws, LPN   [id number]   After Visit Summary: (In Person-Printed) AVS printed and given to the patient  Notes: Nothing significant to report at this time.

## 2023-09-21 NOTE — Patient Instructions (Signed)
 Mr. Eric Crane , Thank you for taking time out of your busy schedule to complete your Annual Wellness Visit with me. I enjoyed our conversation and look forward to speaking with you again next year. I, as well as your care team,  appreciate your ongoing commitment to your health goals. Please review the following plan we discussed and let me know if I can assist you in the future. Your Game plan/ To Do List    Referrals: If you haven't heard from the office you've been referred to, please reach out to them at the phone provided.   Follow up Visits: We will see or speak with you next year for your Next Medicare AWV with our clinical staff Have you seen your provider in the last 6 months (3 months if uncontrolled diabetes)? Yes  Clinician Recommendations:  Aim for 30 minutes of exercise or brisk walking, 6-8 glasses of water, and 5 servings of fruits and vegetables each day.       This is a list of the screenings recommended for you:  Health Maintenance  Topic Date Due   Medicare Annual Wellness Visit  03/24/2023   Flu Shot  08/27/2023   Colon Cancer Screening  02/17/2026   DTaP/Tdap/Td vaccine (2 - Td or Tdap) 12/16/2029   HPV Vaccine  Aged Out   Meningitis B Vaccine  Aged Out   Pneumococcal Vaccine for age over 37  Discontinued   COVID-19 Vaccine  Discontinued   Hepatitis C Screening  Discontinued   Zoster (Shingles) Vaccine  Discontinued    Advanced directives: (Declined) Advance directive discussed with you today. Even though you declined this today, please call our office should you change your mind, and we can give you the proper paperwork for you to fill out. Advance Care Planning is important because it:  [x]  Makes sure you receive the medical care that is consistent with your values, goals, and preferences  [x]  It provides guidance to your family and loved ones and reduces their decisional burden about whether or not they are making the right decisions based on your  wishes.  Follow the link provided in your after visit summary or read over the paperwork we have mailed to you to help you started getting your Advance Directives in place. If you need assistance in completing these, please reach out to us  so that we can help you!  See attachments for Preventive Care and Fall Prevention Tips.

## 2023-09-23 ENCOUNTER — Encounter: Payer: Self-pay | Admitting: Internal Medicine

## 2023-10-01 ENCOUNTER — Encounter: Payer: Self-pay | Admitting: Internal Medicine

## 2023-10-01 ENCOUNTER — Ambulatory Visit: Admitting: Internal Medicine

## 2023-10-01 VITALS — BP 146/76 | HR 58 | Temp 98.0°F | Ht 67.0 in | Wt 165.8 lb

## 2023-10-01 DIAGNOSIS — R600 Localized edema: Secondary | ICD-10-CM | POA: Diagnosis not present

## 2023-10-01 DIAGNOSIS — N183 Chronic kidney disease, stage 3 unspecified: Secondary | ICD-10-CM | POA: Diagnosis not present

## 2023-10-01 DIAGNOSIS — I1A0 Resistant hypertension: Secondary | ICD-10-CM

## 2023-10-01 DIAGNOSIS — S37009D Unspecified injury of unspecified kidney, subsequent encounter: Secondary | ICD-10-CM | POA: Diagnosis not present

## 2023-10-01 DIAGNOSIS — I2510 Atherosclerotic heart disease of native coronary artery without angina pectoris: Secondary | ICD-10-CM

## 2023-10-01 LAB — LIPID PANEL
Cholesterol: 137 mg/dL (ref 0–200)
HDL: 61.5 mg/dL (ref >39.00)
LDL Cholesterol: 52 mg/dL (ref 0–99)
NonHDL: 75.33
Total CHOL/HDL Ratio: 2
Triglycerides: 119 mg/dL (ref 0.0–149.0)
VLDL: 23.8 mg/dL (ref 0.0–40.0)

## 2023-10-01 LAB — COMPREHENSIVE METABOLIC PANEL WITH GFR
ALT: 18 U/L (ref 0–53)
AST: 21 U/L (ref 0–37)
Albumin: 4.6 g/dL (ref 3.5–5.2)
Alkaline Phosphatase: 95 U/L (ref 39–117)
BUN: 31 mg/dL — ABNORMAL HIGH (ref 6–23)
CO2: 28 meq/L (ref 19–32)
Calcium: 9.5 mg/dL (ref 8.4–10.5)
Chloride: 95 meq/L — ABNORMAL LOW (ref 96–112)
Creatinine, Ser: 1.68 mg/dL — ABNORMAL HIGH (ref 0.40–1.50)
GFR: 42.03 mL/min — ABNORMAL LOW (ref >60.00)
Glucose, Bld: 109 mg/dL — ABNORMAL HIGH (ref 70–99)
Potassium: 3.4 meq/L — ABNORMAL LOW (ref 3.5–5.1)
Sodium: 135 meq/L (ref 135–145)
Total Bilirubin: 0.5 mg/dL (ref 0.2–1.2)
Total Protein: 7.7 g/dL (ref 6.0–8.3)

## 2023-10-01 LAB — CBC WITH DIFFERENTIAL/PLATELET
Basophils Absolute: 0.1 K/uL (ref 0.0–0.1)
Basophils Relative: 1 % (ref 0.0–3.0)
Eosinophils Absolute: 0.2 K/uL (ref 0.0–0.7)
Eosinophils Relative: 3.8 % (ref 0.0–5.0)
HCT: 47.5 % (ref 39.0–52.0)
Hemoglobin: 15.8 g/dL (ref 13.0–17.0)
Lymphocytes Relative: 19.3 % (ref 12.0–46.0)
Lymphs Abs: 1.2 K/uL (ref 0.7–4.0)
MCHC: 33.2 g/dL (ref 30.0–36.0)
MCV: 86.9 fl (ref 78.0–100.0)
Monocytes Absolute: 0.9 K/uL (ref 0.1–1.0)
Monocytes Relative: 14 % — ABNORMAL HIGH (ref 3.0–12.0)
Neutro Abs: 4 K/uL (ref 1.4–7.7)
Neutrophils Relative %: 61.9 % (ref 43.0–77.0)
Platelets: 171 K/uL (ref 150.0–400.0)
RBC: 5.46 Mil/uL (ref 4.22–5.81)
RDW: 13.5 % (ref 11.5–15.5)
WBC: 6.4 K/uL (ref 4.0–10.5)

## 2023-10-01 LAB — MICROALBUMIN / CREATININE URINE RATIO
Creatinine,U: 59.1 mg/dL
Microalb Creat Ratio: 14.2 mg/g (ref 0.0–30.0)
Microalb, Ur: 0.8 mg/dL (ref 0.0–1.9)

## 2023-10-01 LAB — TSH: TSH: 0.96 u[IU]/mL (ref 0.35–5.50)

## 2023-10-01 MED ORDER — MINOXIDIL 2.5 MG PO TABS
2.5000 mg | ORAL_TABLET | Freq: Every day | ORAL | 0 refills | Status: DC
Start: 2023-10-01 — End: 2023-10-20

## 2023-10-01 MED ORDER — FUROSEMIDE 20 MG PO TABS: 20.0000 mg | ORAL_TABLET | Freq: Every day | ORAL | 3 refills | Status: DC

## 2023-10-01 NOTE — Assessment & Plan Note (Signed)
 Recurrent GFR problems trying to titrate antihypertensives warrants continued close monitoring.

## 2023-10-01 NOTE — Assessment & Plan Note (Addendum)
 Resistant hypertension  with paradoxically increased blood pressure as spironolactone  dose was increased. Blood pressure remains elevated despite increased spironolactone  dosage. Differential diagnosis includes primary hyperaldosteronism, sleep apnea, and pheochromocytoma. Previous tests ruled out cardiac and hepatic causes. Stop spironolactone . Start minoxidil  at a low dose due to its efficacy in resistant hypertension, despite potential for fluid retention and hypotension. Start Lasix  to counteract fluid retention from minoxidil . Order blood work to monitor kidney function and blood pressure response. Schedule follow-up in one week for blood pressure check and lab review. Share today's notes and labs with Dr. Tobie. Consider referral to hypertension clinic or endocrinologist if needed.

## 2023-10-01 NOTE — Assessment & Plan Note (Signed)
 Atherosclerotic heart disease of native coronary artery without angina   No angina or chest discomfort. Shortness of breath on exertion but not during routine activities. Previous heart tests, including BNP, were normal. Order EKG to assess heart function.

## 2023-10-01 NOTE — Patient Instructions (Signed)
  Meds ordered this encounter  Medications   furosemide  (LASIX ) 20 MG tablet    Sig: Take 1 tablet (20 mg total) by mouth daily.    Dispense:  30 tablet    Refill:  3   minoxidil  (LONITEN ) 2.5 MG tablet    Sig: Take 1 tablet (2.5 mg total) by mouth daily.    Dispense:  30 tablet    Refill:  0  Replace amlodipine  and spironolactone .

## 2023-10-01 NOTE — Assessment & Plan Note (Signed)
 Chronic kidney disease, stage 3   Chronic kidney disease may contribute to resistant hypertension. Previous tests ruled out cardiac and hepatic causes. Order blood work to monitor kidney function. Share lab results with Dr. Tobie.

## 2023-10-01 NOTE — Progress Notes (Signed)
 ==============================  [facility]: [id number]   -- Medical Office Visit --  Patient: Eric Crane      Age: 68 y.o.       Sex:  male  Date:   10/01/2023 Today's Healthcare Provider: Bernardino KANDICE Cone, MD  ==============================   Chief Complaint: Hypertension  Discussed the use of AI scribe software for clinical note transcription with the patient, who gave verbal consent to proceed.  History of Present Illness  68 year old male with hypertension and chronic kidney disease who presents with elevated blood pressure readings despite increased medication.  He has experienced a recent increase in blood pressure readings despite being on increased doses of spironolactone . His blood pressure has been recorded in the 160s at home, with specific readings including 161/87 on September 3rd and 167/98 on September 2nd. Historically, his blood pressure was in the 140s and 150s when he was on a lower dose of spironolactone .  He has a history of chronic kidney disease, which complicates his blood pressure management as many medications that could help his blood pressure may adversely affect his kidneys. He has not been taking ibuprofen  or alcohol and has not experienced any changes in his sleep quality. He uses Tylenol  for minor aches and pains.  No recent increase in salt intake, alcohol consumption, daytime drowsiness, nasal congestion, or changes in sleep quality that could suggest sleep apnea. He has not noticed any significant changes in his lifestyle or diet that could account for the blood pressure changes.  He experiences shortness of breath during exertion, such as when running with his dog or shoveling gravel, but not during activities like playing hockey. No chest discomfort.  His current medications include spironolactone , which he has been taking at an increased dose, and he has a history of not tolerating ACE inhibitors or ARBs  due to kidney issues. He is also on Crestor  for cholesterol management.  BP Readings from Last 30 Encounters:  10/01/23 (!) 146/76  09/21/23 126/70  09/15/23 132/74  08/30/23 112/60  08/11/23 130/70  07/09/23 (!) 110/58  06/30/23 106/60  06/23/23 102/60  06/07/23 134/68  05/28/23 130/60  05/17/23 122/62  05/10/23 (!) 140/82  05/05/23 (!) 149/77  04/26/23 138/72  04/09/23 122/68  03/26/23 (!) 167/80  03/31/22 134/68  03/23/22 126/74  02/03/22 117/65  01/21/22 (!) 150/75  11/20/21 138/68  03/10/21 (!) 143/68  12/17/19 (!) 147/81  10/26/19 (!) 141/75  02/18/16 (!) 173/88  10/13/11 160/79      Background Reviewed: Problem List: has Primary osteoarthritis of right knee; Weight disorder; FH: heart disease; Osteoarthritis of lower back; Hyperlipidemia, acquired; Thrombocytopenia (HCC); Attention deficit hyperactivity disorder (ADHD); Bipolar 2 disorder (HCC); H/O total knee replacement; Prediabetes; Statin intolerance; Status post total right knee replacement; Pain in left hip; Medication management; Chronic kidney disease (CKD), active medical management without dialysis, stage 3 (moderate) (HCC); Hypertension; Coronary artery calcification; Elevated serum creatinine; Nephrolithiasis; Gallstones; Injury to kidney; Elevated PSA; and Polycystic kidney disease on their problem list. Past Medical History:  has a past medical history of Arthritis (2021), Bipolar 2 disorder (HCC) (11/20/2021), Chronic kidney disease (03/26/2023), Depression, FH: heart disease (11/20/2021), Hypertension (03/25/2024), Osteoarthritis of lower back (11/20/2021), Overweight (11/20/2021), and Thrombocytopenia (HCC) (11/20/2021). Past Surgical History:   has a past surgical history that includes Colonoscopy with propofol  (N/A, 02/18/2016); Tonsillectomy; and Total knee arthroplasty (Right, 02/02/2022). Social History:   reports that he has never smoked. He has never used smokeless tobacco. He reports that  he does not  drink alcohol and does not use drugs. Family History:  family history includes Heart disease in his mother. Allergies:  is allergic to ace inhibitors and angiotensin receptor blockers.   Medication Reconciliation: Current Outpatient Medications on File Prior to Visit  Medication Sig   amLODipine  (NORVASC ) 10 MG tablet Take 1 tablet (10 mg total) by mouth daily.   [START ON 10/06/2023] amphetamine -dextroamphetamine  (ADDERALL) 20 MG tablet Take 1 tablet (20 mg total) by mouth 3 (three) times daily. (10-06-23)   aspirin  EC 81 MG tablet Take 1 tablet (81 mg total) by mouth daily. Swallow whole.   doxazosin  (CARDURA ) 1 MG tablet Take 1 mg by mouth daily.   empagliflozin  (JARDIANCE ) 25 MG TABS tablet Take 1 tablet (25 mg total) by mouth daily before breakfast.   lamoTRIgine  (LAMICTAL ) 200 MG tablet Take 200 mg by mouth daily.   Lurasidone HCl 120 MG TABS    QUEtiapine  Fumarate (SEROQUEL  XR) 150 MG 24 hr tablet Take 300 mg by mouth at bedtime.   rosuvastatin  (CRESTOR ) 20 MG tablet Take 1 tablet (20 mg total) by mouth daily.   spironolactone -hydrochlorothiazide  (ALDACTAZIDE) 25-25 MG tablet Take 1 tablet by mouth daily. To start just 0.25 tablet daily for 1 week.   No current facility-administered medications on file prior to visit.  There are no discontinued medications.   Physical Exam:    10/01/2023    9:08 AM 09/21/2023    8:39 AM 09/15/2023    3:39 PM  Vitals with BMI  Height 5' 7 5' 7   Weight 165 lbs 13 oz 168 lbs   BMI 25.96 26.31   Systolic 146 126 867  Diastolic 76 70 74  Pulse 58 55   Vital signs reviewed.  Nursing notes reviewed. Weight trend reviewed. Physical Activity: Sufficiently Active (09/21/2023)   Exercise Vital Sign    Days of Exercise per Week: 5 days    Minutes of Exercise per Session: 40 min   General Appearance:  No acute distress appreciable.   Well-groomed, healthy-appearing male.  Well proportioned with no abnormal fat distribution.  Good muscle  tone. Pulmonary:  Normal work of breathing at rest, no respiratory distress apparent. SpO2: 98 %  Musculoskeletal: All extremities are intact.  Neurological:  Awake, alert, oriented, and engaged.  No obvious focal neurological deficits or cognitive impairments.  Sensorium seems unclouded.   Speech is clear and coherent with logical content. Psychiatric:  Appropriate mood, pleasant and cooperative demeanor, thoughtful and engaged during the exam   Verbalized to patient: Physical Exam HEENT: Nasal deformity due to previous fracture.   Results     09/21/2023    8:47 AM 08/11/2023    8:04 AM 06/30/2023    8:48 AM 04/09/2023    8:47 AM  PHQ 2/9 Scores  PHQ - 2 Score 0 0 0 0   Office Visit on 09/15/2023  Component Date Value Ref Range Status   Sodium 09/15/2023 135  135 - 145 mEq/L Final   Potassium 09/15/2023 4.9  3.5 - 5.1 mEq/L Final   Chloride 09/15/2023 103  96 - 112 mEq/L Final   CO2 09/15/2023 23  19 - 32 mEq/L Final   Glucose, Bld 09/15/2023 93  70 - 99 mg/dL Final   BUN 91/79/7974 20  6 - 23 mg/dL Final   Creatinine, Ser 09/15/2023 1.42  0.40 - 1.50 mg/dL Final   Total Bilirubin 09/15/2023 0.4  0.2 - 1.2 mg/dL Final   Alkaline Phosphatase 09/15/2023 96  39 -  117 U/L Final   AST 09/15/2023 20  0 - 37 U/L Final   ALT 09/15/2023 18  0 - 53 U/L Final   Total Protein 09/15/2023 7.5  6.0 - 8.3 g/dL Final   Albumin 91/79/7974 4.6  3.5 - 5.2 g/dL Final   GFR 91/79/7974 51.44 (L)  >60.00 mL/min Final   Calcium  09/15/2023 9.3  8.4 - 10.5 mg/dL Final   Pro B Natriuretic peptide (BNP) 09/15/2023 45.0  0.0 - 100.0 pg/mL Final   Creatinine, Urine 09/15/2023 15 (L)  20 - 320 mg/dL Final   Protein/Creat Ratio 09/15/2023 NOTE  25 - 148 mg/g creat Final   Protein/Creatinine Ratio 09/15/2023 NOTE  0.025 - 0.148 mg/mg creat Final   Total Protein, Urine 09/15/2023 <4 (L)  5 - 25 mg/dL Final  Office Visit on 08/30/2023  Component Date Value Ref Range Status   CYSTATIN C 08/30/2023 1.37 (H)   0.52 - 1.20 mg/L Final   eGFR 08/30/2023 50 (L)  >=60 mL/min/1.39m2 Final   Creatinine, Urine 08/30/2023 48  20 - 320 mg/dL Final   Protein/Creat Ratio 08/30/2023 125  25 - 148 mg/g creat Final   Protein/Creatinine Ratio 08/30/2023 0.125  0.025 - 0.148 mg/mg creat Final   Total Protein, Urine 08/30/2023 6  5 - 25 mg/dL Final   Sodium 91/95/7974 138  135 - 145 mEq/L Final   Potassium 08/30/2023 4.3  3.5 - 5.1 mEq/L Final   Chloride 08/30/2023 105  96 - 112 mEq/L Final   CO2 08/30/2023 24  19 - 32 mEq/L Final   Glucose, Bld 08/30/2023 105 (H)  70 - 99 mg/dL Final   BUN 91/95/7974 24 (H)  6 - 23 mg/dL Final   Creatinine, Ser 08/30/2023 1.43  0.40 - 1.50 mg/dL Final   GFR 91/95/7974 51.03 (L)  >60.00 mL/min Final   Calcium  08/30/2023 9.4  8.4 - 10.5 mg/dL Final   Microalb, Ur 91/95/7974 <0.7  mg/dL Final   Creatinine,U 91/95/7974 46.9  mg/dL Final   Microalb Creat Ratio 08/30/2023 Unable to calculate  0.0 - 30.0 mg/g Final  Lab on 08/23/2023  Component Date Value Ref Range Status   Phosphorus 08/23/2023 4.0  2.3 - 4.6 mg/dL Final   Magnesium 92/71/7974 2.3  1.5 - 2.5 mg/dL Final   WBC 92/71/7974 4.6  4.0 - 10.5 K/uL Final   RBC 08/23/2023 4.66  4.22 - 5.81 Mil/uL Final   Hemoglobin 08/23/2023 13.8  13.0 - 17.0 g/dL Final   HCT 92/71/7974 41.5  39.0 - 52.0 % Final   MCV 08/23/2023 89.1  78.0 - 100.0 fl Final   MCHC 08/23/2023 33.3  30.0 - 36.0 g/dL Final   RDW 92/71/7974 13.5  11.5 - 15.5 % Final   Platelets 08/23/2023 150.0  150.0 - 400.0 K/uL Final   Neutrophils Relative % 08/23/2023 45.4  43.0 - 77.0 % Final   Lymphocytes Relative 08/23/2023 29.0  12.0 - 46.0 % Final   Monocytes Relative 08/23/2023 16.3 (H)  3.0 - 12.0 % Final   Eosinophils Relative 08/23/2023 7.8 (H)  0.0 - 5.0 % Final   Basophils Relative 08/23/2023 1.5  0.0 - 3.0 % Final   Neutro Abs 08/23/2023 2.1  1.4 - 7.7 K/uL Final   Lymphs Abs 08/23/2023 1.3  0.7 - 4.0 K/uL Final   Monocytes Absolute 08/23/2023 0.7  0.1 -  1.0 K/uL Final   Eosinophils Absolute 08/23/2023 0.4  0.0 - 0.7 K/uL Final   Basophils Absolute 08/23/2023 0.1  0.0 -  0.1 K/uL Final   Sodium 08/23/2023 136  135 - 145 mEq/L Final   Potassium 08/23/2023 4.5  3.5 - 5.1 mEq/L Final   Chloride 08/23/2023 104  96 - 112 mEq/L Final   CO2 08/23/2023 25  19 - 32 mEq/L Final   Glucose, Bld 08/23/2023 124 (H)  70 - 99 mg/dL Final   BUN 92/71/7974 25 (H)  6 - 23 mg/dL Final   Creatinine, Ser 08/23/2023 1.54 (H)  0.40 - 1.50 mg/dL Final   Total Bilirubin 08/23/2023 0.5  0.2 - 1.2 mg/dL Final   Alkaline Phosphatase 08/23/2023 89  39 - 117 U/L Final   AST 08/23/2023 17  0 - 37 U/L Final   ALT 08/23/2023 16  0 - 53 U/L Final   Total Protein 08/23/2023 6.8  6.0 - 8.3 g/dL Final   Albumin 92/71/7974 4.4  3.5 - 5.2 g/dL Final   GFR 92/71/7974 46.69 (L)  >60.00 mL/min Final   Calcium  08/23/2023 9.1  8.4 - 10.5 mg/dL Final   CYSTATIN C 92/71/7974 1.39 (H)  0.52 - 1.20 mg/L Final   eGFR 08/23/2023 49 (L)  >=60 mL/min/1.57m2 Final   Color, Urine 08/23/2023 YELLOW  YELLOW Final   APPearance 08/23/2023 CLEAR  CLEAR Final   Specific Gravity, Urine 08/23/2023 1.019  1.001 - 1.035 Final   pH 08/23/2023 6.0  5.0 - 8.0 Final   Glucose, UA 08/23/2023 3+ (A)  NEGATIVE Final   Bilirubin Urine 08/23/2023 NEGATIVE  NEGATIVE Final   Ketones, ur 08/23/2023 NEGATIVE  NEGATIVE Final   Hgb urine dipstick 08/23/2023 NEGATIVE  NEGATIVE Final   Protein, ur 08/23/2023 NEGATIVE  NEGATIVE Final   Nitrites, Initial 08/23/2023 NEGATIVE  NEGATIVE Final   Leukocyte Esterase 08/23/2023 NEGATIVE  NEGATIVE Final   WBC, UA 08/23/2023 NONE SEEN  0 - 5 /HPF Final   RBC / HPF 08/23/2023 NONE SEEN  0 - 2 /HPF Final   Squamous Epithelial / HPF 08/23/2023 0-5  < OR = 5 /HPF Final   Bacteria, UA 08/23/2023 NONE SEEN  NONE SEEN /HPF Final   Hyaline Cast 08/23/2023 NONE SEEN  NONE SEEN /LPF Final   Note 08/23/2023    Final   Creatinine, Urine 08/23/2023 84  20 - 320 mg/dL Final    Protein/Creat Ratio 08/23/2023 190 (H)  25 - 148 mg/g creat Final   Protein/Creatinine Ratio 08/23/2023 0.190 (H)  0.025 - 0.148 mg/mg creat Final   Total Protein, Urine 08/23/2023 16  5 - 25 mg/dL Final   Microalb, Ur 92/71/7974 0.9  0.0 - 1.9 mg/dL Final   Creatinine,U 92/71/7974 81.1  mg/dL Final   Microalb Creat Ratio 08/23/2023 11.2  0.0 - 30.0 mg/g Final   Reflexve Urine Culture 08/23/2023    Final  Lab on 08/19/2023  Component Date Value Ref Range Status   Sodium 08/19/2023 131 (L)  135 - 145 mEq/L Final   Potassium 08/19/2023 4.7  3.5 - 5.1 mEq/L Final   Chloride 08/19/2023 99  96 - 112 mEq/L Final   CO2 08/19/2023 21  19 - 32 mEq/L Final   Glucose, Bld 08/19/2023 110 (H)  70 - 99 mg/dL Final   BUN 92/75/7974 38 (H)  6 - 23 mg/dL Final   Creatinine, Ser 08/19/2023 2.53 (H)  0.40 - 1.50 mg/dL Final   GFR 92/75/7974 25.74 (L)  >60.00 mL/min Final   Calcium  08/19/2023 9.1  8.4 - 10.5 mg/dL Final   Microalb, Ur 92/75/7974 2.6 (H)  0.0 - 1.9 mg/dL  Final   Creatinine,U 08/19/2023 140.6  mg/dL Final   Microalb Creat Ratio 08/19/2023 18.2  0.0 - 30.0 mg/g Final  Office Visit on 08/11/2023  Component Date Value Ref Range Status   Sodium 08/11/2023 137  135 - 145 mEq/L Final   Potassium 08/11/2023 4.3  3.5 - 5.1 mEq/L Final   Chloride 08/11/2023 105  96 - 112 mEq/L Final   CO2 08/11/2023 23  19 - 32 mEq/L Final   Glucose, Bld 08/11/2023 101 (H)  70 - 99 mg/dL Final   BUN 92/83/7974 24 (H)  6 - 23 mg/dL Final   Creatinine, Ser 08/11/2023 1.40  0.40 - 1.50 mg/dL Final   GFR 92/83/7974 52.36 (L)  >60.00 mL/min Final   Calcium  08/11/2023 9.4  8.4 - 10.5 mg/dL Final   CYSTATIN C 92/83/7974 1.43 (H)  0.52 - 1.20 mg/L Final   eGFR 08/11/2023 47 (L)  >=60 mL/min/1.32m2 Final   Creatinine, Urine 08/11/2023 28  20 - 320 mg/dL Final   Protein/Creat Ratio 08/11/2023 NOTE  25 - 148 mg/g creat Final   Protein/Creatinine Ratio 08/11/2023 NOTE  0.025 - 0.148 mg/mg creat Final   Total Protein,  Urine 08/11/2023 <4 (L)  5 - 25 mg/dL Final  Lab on 92/89/7974  Component Date Value Ref Range Status   Sodium 08/05/2023 134 (L)  135 - 145 mEq/L Final   Potassium 08/05/2023 4.4  3.5 - 5.1 mEq/L Final   Chloride 08/05/2023 104  96 - 112 mEq/L Final   CO2 08/05/2023 24  19 - 32 mEq/L Final   Glucose, Bld 08/05/2023 143 (H)  70 - 99 mg/dL Final   BUN 92/89/7974 22  6 - 23 mg/dL Final   Creatinine, Ser 08/05/2023 1.41  0.40 - 1.50 mg/dL Final   GFR 92/89/7974 51.92 (L)  >60.00 mL/min Final   Calcium  08/05/2023 9.1  8.4 - 10.5 mg/dL Final   Microalb, Ur 92/89/7974 2.1 (H)  0.0 - 1.9 mg/dL Final   Creatinine,U 92/89/7974 121.0  mg/dL Final   Microalb Creat Ratio 08/05/2023 17.2  0.0 - 30.0 mg/g Final  Appointment on 07/29/2023  Component Date Value Ref Range Status   Microalb, Ur 07/29/2023 1.2  0.0 - 1.9 mg/dL Final   Creatinine,U 92/96/7974 106.0  mg/dL Final   Microalb Creat Ratio 07/29/2023 11.3  0.0 - 30.0 mg/g Final   Sodium 07/29/2023 139  135 - 145 mEq/L Final   Potassium 07/29/2023 4.9  3.5 - 5.1 mEq/L Final   Chloride 07/29/2023 106  96 - 112 mEq/L Final   CO2 07/29/2023 27  19 - 32 mEq/L Final   Glucose, Bld 07/29/2023 130 (H)  70 - 99 mg/dL Final   BUN 92/96/7974 25 (H)  6 - 23 mg/dL Final   Creatinine, Ser 07/29/2023 1.52 (H)  0.40 - 1.50 mg/dL Final   GFR 92/96/7974 47.45 (L)  >60.00 mL/min Final   Calcium  07/29/2023 9.4  8.4 - 10.5 mg/dL Final   CYSTATIN C 92/96/7974 1.45 (H)  0.52 - 1.20 mg/L Final   eGFR 07/29/2023 46 (L)  >=60 mL/min/1.58m2 Final   Creatinine, Urine 07/29/2023 106  20 - 320 mg/dL Final   Protein/Creat Ratio 07/29/2023 170 (H)  25 - 148 mg/g creat Final   Protein/Creatinine Ratio 07/29/2023 0.170 (H)  0.025 - 0.148 mg/mg creat Final   Total Protein, Urine 07/29/2023 18  5 - 25 mg/dL Final  Lab on 93/72/7974  Component Date Value Ref Range Status   Sodium 07/23/2023 133 (L)  135 - 145 mEq/L Final   Potassium 07/23/2023 5.2 No hemolysis seen (H)  3.5  - 5.1 mEq/L Final   Chloride 07/23/2023 105  96 - 112 mEq/L Final   CO2 07/23/2023 22  19 - 32 mEq/L Final   Glucose, Bld 07/23/2023 102 (H)  70 - 99 mg/dL Final   BUN 93/72/7974 41 (H)  6 - 23 mg/dL Final   Creatinine, Ser 07/23/2023 2.06 (H)  0.40 - 1.50 mg/dL Final   GFR 93/72/7974 32.95 (L)  >60.00 mL/min Final   Calcium  07/23/2023 9.3  8.4 - 10.5 mg/dL Final   Microalb, Ur 93/72/7974 2.0 (H)  0.0 - 1.9 mg/dL Final   Creatinine,U 93/72/7974 89.3  mg/dL Final   Microalb Creat Ratio 07/23/2023 22.4  0.0 - 30.0 mg/g Final  Office Visit on 07/09/2023  Component Date Value Ref Range Status   Sodium 07/09/2023 138  135 - 145 mEq/L Final   Potassium 07/09/2023 4.1  3.5 - 5.1 mEq/L Final   Chloride 07/09/2023 105  96 - 112 mEq/L Final   CO2 07/09/2023 24  19 - 32 mEq/L Final   Glucose, Bld 07/09/2023 95  70 - 99 mg/dL Final   BUN 93/86/7974 24 (H)  6 - 23 mg/dL Final   Creatinine, Ser 07/09/2023 1.45  0.40 - 1.50 mg/dL Final   GFR 93/86/7974 50.23 (L)  >60.00 mL/min Final   Calcium  07/09/2023 9.0  8.4 - 10.5 mg/dL Final   Microalb, Ur 93/86/7974 <0.7  mg/dL Final   Creatinine,U 93/86/7974 56.7  mg/dL Final   Microalb Creat Ratio 07/09/2023 Unable to calculate  0.0 - 30.0 mg/g Final  Lab on 06/30/2023  Component Date Value Ref Range Status   CYSTATIN C 06/30/2023 1.54 (H)  0.52 - 1.20 mg/L Final   eGFR 06/30/2023 43 (L)  >=60 mL/min/1.49m2 Final   Microalb, Ur 06/30/2023 <0.7  mg/dL Final   Creatinine,U 93/95/7974 48.8  mg/dL Final   Microalb Creat Ratio 06/30/2023 Unable to calculate  0.0 - 30.0 mg/g Final   Sodium 06/30/2023 138  135 - 145 mEq/L Final   Potassium 06/30/2023 4.4  3.5 - 5.1 mEq/L Final   Chloride 06/30/2023 107  96 - 112 mEq/L Final   CO2 06/30/2023 25  19 - 32 mEq/L Final   Glucose, Bld 06/30/2023 99  70 - 99 mg/dL Final   BUN 93/95/7974 25 (H)  6 - 23 mg/dL Final   Creatinine, Ser 06/30/2023 1.45  0.40 - 1.50 mg/dL Final   GFR 93/95/7974 50.24 (L)  >60.00 mL/min  Final   Calcium  06/30/2023 9.3  8.4 - 10.5 mg/dL Final  There may be more visits with results that are not included.  No image results found. No results found.       ASSESSMENT & PLAN   Assessment & Plan Resistant hypertension Resistant hypertension  with paradoxically increased blood pressure as spironolactone  dose was increased. Blood pressure remains elevated despite increased spironolactone  dosage. Differential diagnosis includes primary hyperaldosteronism, sleep apnea, and pheochromocytoma. Previous tests ruled out cardiac and hepatic causes. Stop spironolactone . Start minoxidil  at a low dose due to its efficacy in resistant hypertension, despite potential for fluid retention and hypotension. Start Lasix  to counteract fluid retention from minoxidil . Order blood work to monitor kidney function and blood pressure response. Schedule follow-up in one week for blood pressure check and lab review. Share today's notes and labs with Dr. Tobie. Consider referral to hypertension clinic or endocrinologist if needed. Coronary artery calcification Atherosclerotic heart disease of  native coronary artery without angina   No angina or chest discomfort. Shortness of breath on exertion but not during routine activities. Previous heart tests, including BNP, were normal. Order EKG to assess heart function. Chronic kidney disease (CKD), active medical management without dialysis, stage 3 (moderate) (HCC) Chronic kidney disease, stage 3   Chronic kidney disease may contribute to resistant hypertension. Previous tests ruled out cardiac and hepatic causes. Order blood work to monitor kidney function. Share lab results with Dr. Tobie. Injury of kidney, unspecified laterality, subsequent encounter Recurrent GFR problems trying to titrate antihypertensives warrants continued close monitoring.  Lower extremity edema lower extremity edema is mild, negative BNP and LFT, suggest mainly due to amlodipine  and chronic  kidney disease. Intermittent edema possibly related to resistant hypertension and medication side effects. Start Lasix  to manage lower extremity edema and fluid retention from minoxidil . Monitor for changes in edema with new medication regimen.  ORDER ASSOCIATIONS  #   DIAGNOSIS / CONDITION ICD-10 ENCOUNTER ORDER     ICD-10-CM   1. Resistant hypertension  I1A.0 Protein / creatinine ratio, urine    Cystatin C with Glomerular Filtration Rate, Estimated (eGFR)    Aldosterone + renin activity w/ ratio    Salivary Cortisol X2, Timed    Comprehensive metabolic panel with GFR    Urinalysis w microscopic + reflex cultur    TSH    Microalbumin / creatinine urine ratio    CBC with Differential/Platelet    Lipid panel    furosemide  (LASIX ) 20 MG tablet    minoxidil  (LONITEN ) 2.5 MG tablet    2. Coronary artery calcification  I25.10 Lipid panel    3. Chronic kidney disease (CKD), active medical management without dialysis, stage 3 (moderate) (HCC)  N18.30 Cystatin C with Glomerular Filtration Rate, Estimated (eGFR)    Aldosterone + renin activity w/ ratio    Urinalysis w microscopic + reflex cultur    CBC with Differential/Platelet    4. Injury of kidney, unspecified laterality, subsequent encounter  S37.009D Cystatin C with Glomerular Filtration Rate, Estimated (eGFR)    Aldosterone + renin activity w/ ratio    Urinalysis w microscopic + reflex cultur    Protein / creatinine ratio, urine    Cystatin C with Glomerular Filtration Rate, Estimated (eGFR)    Basic metabolic panel with GFR    5. Lower extremity edema  R60.0            Orders Placed in Encounter:   Lab Orders         Protein / creatinine ratio, urine         Cystatin C with Glomerular Filtration Rate, Estimated (eGFR)         Aldosterone + renin activity w/ ratio         Salivary Cortisol X2, Timed         Comprehensive metabolic panel with GFR         Urinalysis w microscopic + reflex cultur         TSH          Microalbumin / creatinine urine ratio         CBC with Differential/Platelet         Lipid panel         Protein / creatinine ratio, urine         Cystatin C with Glomerular Filtration Rate, Estimated (eGFR)         Basic metabolic panel with GFR     Ordered labs  for today and next week after these medication(s) changes  Meds ordered this encounter  Medications   furosemide  (LASIX ) 20 MG tablet    Sig: Take 1 tablet (20 mg total) by mouth daily.    Dispense:  30 tablet    Refill:  3   minoxidil  (LONITEN ) 2.5 MG tablet    Sig: Take 1 tablet (2.5 mg total) by mouth daily.    Dispense:  30 tablet    Refill:  0  Replace amlodipine  and spironolactone          This document was synthesized by artificial intelligence ([employer]) using HIPAA-compliant recording of the clinical interaction;   We discussed the use of AI scribe software for clinical note transcription with the patient, who gave verbal consent to proceed. additional Info: This encounter employed state-of-the-art, real-time, collaborative documentation. The patient actively reviewed and assisted in updating their electronic medical record on a shared screen, ensuring transparency and facilitating joint problem-solving for the problem list, overview, and plan. This approach promotes accurate, informed care. The treatment plan was discussed and reviewed in detail, including medication safety, potential side effects, and all patient questions. We confirmed understanding and comfort with the plan. Follow-up instructions were established, including contacting the office for any concerns, returning if symptoms worsen, persist, or new symptoms develop, and precautions for potential emergency department visits.

## 2023-10-03 ENCOUNTER — Ambulatory Visit: Payer: Self-pay | Admitting: Internal Medicine

## 2023-10-03 DIAGNOSIS — Z79899 Other long term (current) drug therapy: Secondary | ICD-10-CM

## 2023-10-03 DIAGNOSIS — R7989 Other specified abnormal findings of blood chemistry: Secondary | ICD-10-CM

## 2023-10-03 NOTE — Progress Notes (Signed)
 Make sure patient is advised to hold on Jardiance  as soon as possible, and that he sees these recommendations.

## 2023-10-04 ENCOUNTER — Other Ambulatory Visit

## 2023-10-04 DIAGNOSIS — I10 Essential (primary) hypertension: Secondary | ICD-10-CM | POA: Diagnosis not present

## 2023-10-04 DIAGNOSIS — R931 Abnormal findings on diagnostic imaging of heart and coronary circulation: Secondary | ICD-10-CM | POA: Diagnosis not present

## 2023-10-04 DIAGNOSIS — I2510 Atherosclerotic heart disease of native coronary artery without angina pectoris: Secondary | ICD-10-CM | POA: Diagnosis not present

## 2023-10-04 DIAGNOSIS — I1A0 Resistant hypertension: Secondary | ICD-10-CM | POA: Diagnosis not present

## 2023-10-04 LAB — CYSTATIN C WITH GLOMERULAR FILTRATION RATE, ESTIMATED (EGFR)
CYSTATIN C: 1.57 mg/L — ABNORMAL HIGH (ref 0.52–1.20)
eGFR: 42 mL/min/1.73m2 — ABNORMAL LOW (ref >=60)

## 2023-10-04 LAB — PROTEIN / CREATININE RATIO, URINE
Creatinine, Urine: 60 mg/dL (ref 20–320)
Protein/Creat Ratio: 150 mg/g{creat} — ABNORMAL HIGH (ref 25–148)
Protein/Creatinine Ratio: 0.15 mg/mg{creat} — ABNORMAL HIGH (ref 0.025–0.148)
Total Protein, Urine: 9 mg/dL (ref 5–25)

## 2023-10-05 ENCOUNTER — Ambulatory Visit: Payer: Self-pay | Admitting: Nurse Practitioner

## 2023-10-05 LAB — ALT: ALT: 15 IU/L (ref 0–44)

## 2023-10-05 LAB — URINALYSIS W MICROSCOPIC + REFLEX CULTURE
Bacteria, UA: NONE SEEN /HPF
Bilirubin Urine: NEGATIVE
Hgb urine dipstick: NEGATIVE
Ketones, ur: NEGATIVE
Leukocyte Esterase: NEGATIVE
Nitrites, Initial: NEGATIVE
Protein, ur: NEGATIVE
RBC / HPF: NONE SEEN /HPF (ref 0–2)
Specific Gravity, Urine: 1.016 (ref 1.001–1.035)
Squamous Epithelial / HPF: NONE SEEN /HPF (ref <=5)
WBC, UA: NONE SEEN /HPF (ref 0–5)
pH: 6.5 (ref 5.0–8.0)

## 2023-10-05 LAB — LIPID PANEL
Chol/HDL Ratio: 2.2 ratio (ref 0.0–5.0)
Cholesterol, Total: 129 mg/dL (ref 100–199)
HDL: 58 mg/dL (ref >39)
LDL Chol Calc (NIH): 59 mg/dL (ref 0–99)
Triglycerides: 52 mg/dL (ref 0–149)
VLDL Cholesterol Cal: 12 mg/dL (ref 5–40)

## 2023-10-05 LAB — ALDOSTERONE + RENIN ACTIVITY W/ RATIO
ALDO / PRA Ratio: 2.3 ratio (ref 0.9–28.9)
Aldosterone: 18 ng/dL
Renin Activity: 7.9 ng/mL/h — ABNORMAL HIGH (ref 0.25–5.82)

## 2023-10-05 LAB — NO CULTURE INDICATED

## 2023-10-05 LAB — EXTRA URINE SPECIMEN

## 2023-10-05 LAB — LIPOPROTEIN A (LPA): Lipoprotein (a): 18.7 nmol/L (ref <75.0)

## 2023-10-06 ENCOUNTER — Other Ambulatory Visit (INDEPENDENT_AMBULATORY_CARE_PROVIDER_SITE_OTHER)

## 2023-10-06 DIAGNOSIS — I1A0 Resistant hypertension: Secondary | ICD-10-CM | POA: Diagnosis not present

## 2023-10-06 DIAGNOSIS — S37009D Unspecified injury of unspecified kidney, subsequent encounter: Secondary | ICD-10-CM | POA: Diagnosis not present

## 2023-10-06 LAB — BASIC METABOLIC PANEL WITH GFR
BUN: 20 mg/dL (ref 6–23)
CO2: 27 meq/L (ref 19–32)
Calcium: 9.3 mg/dL (ref 8.4–10.5)
Chloride: 105 meq/L (ref 96–112)
Creatinine, Ser: 1.67 mg/dL — ABNORMAL HIGH (ref 0.40–1.50)
GFR: 42.33 mL/min — ABNORMAL LOW (ref >60.00)
Glucose, Bld: 110 mg/dL — ABNORMAL HIGH (ref 70–99)
Potassium: 3.8 meq/L (ref 3.5–5.1)
Sodium: 139 meq/L (ref 135–145)

## 2023-10-07 ENCOUNTER — Other Ambulatory Visit

## 2023-10-07 ENCOUNTER — Other Ambulatory Visit (HOSPITAL_BASED_OUTPATIENT_CLINIC_OR_DEPARTMENT_OTHER): Payer: Self-pay

## 2023-10-07 DIAGNOSIS — I129 Hypertensive chronic kidney disease with stage 1 through stage 4 chronic kidney disease, or unspecified chronic kidney disease: Secondary | ICD-10-CM | POA: Diagnosis not present

## 2023-10-07 DIAGNOSIS — N2581 Secondary hyperparathyroidism of renal origin: Secondary | ICD-10-CM | POA: Diagnosis not present

## 2023-10-07 DIAGNOSIS — N1832 Chronic kidney disease, stage 3b: Secondary | ICD-10-CM | POA: Diagnosis not present

## 2023-10-07 DIAGNOSIS — D631 Anemia in chronic kidney disease: Secondary | ICD-10-CM | POA: Diagnosis not present

## 2023-10-08 ENCOUNTER — Other Ambulatory Visit

## 2023-10-08 ENCOUNTER — Other Ambulatory Visit (HOSPITAL_BASED_OUTPATIENT_CLINIC_OR_DEPARTMENT_OTHER): Payer: Self-pay

## 2023-10-08 LAB — PROTEIN / CREATININE RATIO, URINE
Creatinine, Urine: 103 mg/dL (ref 20–320)
Protein/Creat Ratio: 117 mg/g{creat} (ref 25–148)
Protein/Creatinine Ratio: 0.117 mg/mg{creat} (ref 0.025–0.148)
Total Protein, Urine: 12 mg/dL (ref 5–25)

## 2023-10-08 LAB — CYSTATIN C WITH GLOMERULAR FILTRATION RATE, ESTIMATED (EGFR)
CYSTATIN C: 1.26 mg/L — ABNORMAL HIGH (ref 0.52–1.20)
eGFR: 56 mL/min/1.73m2 — ABNORMAL LOW (ref >=60)

## 2023-10-09 LAB — CORTISOL, LC/MS, SALIVA, 2 SAMPLES
Cortisol Savliva Sample: 0.19 ug/dL
Cortisol, Saliva Sample: 0.15 ug/dL

## 2023-10-15 ENCOUNTER — Other Ambulatory Visit

## 2023-10-18 ENCOUNTER — Ambulatory Visit: Admitting: Internal Medicine

## 2023-10-18 ENCOUNTER — Other Ambulatory Visit (INDEPENDENT_AMBULATORY_CARE_PROVIDER_SITE_OTHER)

## 2023-10-18 DIAGNOSIS — N179 Acute kidney failure, unspecified: Secondary | ICD-10-CM

## 2023-10-18 DIAGNOSIS — Z79899 Other long term (current) drug therapy: Secondary | ICD-10-CM

## 2023-10-18 DIAGNOSIS — I1A0 Resistant hypertension: Secondary | ICD-10-CM | POA: Diagnosis not present

## 2023-10-18 DIAGNOSIS — R7989 Other specified abnormal findings of blood chemistry: Secondary | ICD-10-CM

## 2023-10-18 DIAGNOSIS — N183 Chronic kidney disease, stage 3 unspecified: Secondary | ICD-10-CM | POA: Diagnosis not present

## 2023-10-18 LAB — BASIC METABOLIC PANEL WITH GFR
BUN: 23 mg/dL (ref 6–23)
CO2: 25 meq/L (ref 19–32)
Calcium: 9 mg/dL (ref 8.4–10.5)
Chloride: 107 meq/L (ref 96–112)
Creatinine, Ser: 1.52 mg/dL — ABNORMAL HIGH (ref 0.40–1.50)
GFR: 47.38 mL/min — ABNORMAL LOW (ref >60.00)
Glucose, Bld: 103 mg/dL — ABNORMAL HIGH (ref 70–99)
Potassium: 4.1 meq/L (ref 3.5–5.1)
Sodium: 139 meq/L (ref 135–145)

## 2023-10-18 LAB — MICROALBUMIN / CREATININE URINE RATIO
Creatinine,U: 108 mg/dL
Microalb Creat Ratio: UNDETERMINED mg/g (ref 0.0–30.0)
Microalb, Ur: <0.7 mg/dL

## 2023-10-19 LAB — PROTEIN / CREATININE RATIO, URINE
Creatinine, Urine: 113 mg/dL (ref 20–320)
Protein/Creat Ratio: 80 mg/g{creat} (ref 25–148)
Protein/Creatinine Ratio: 0.08 mg/mg{creat} (ref 0.025–0.148)
Total Protein, Urine: 9 mg/dL (ref 5–25)

## 2023-10-20 ENCOUNTER — Encounter: Payer: Self-pay | Admitting: Internal Medicine

## 2023-10-20 ENCOUNTER — Ambulatory Visit (INDEPENDENT_AMBULATORY_CARE_PROVIDER_SITE_OTHER): Admitting: Internal Medicine

## 2023-10-20 VITALS — BP 130/72 | HR 64 | Temp 98.0°F | Ht 67.0 in | Wt 170.6 lb

## 2023-10-20 DIAGNOSIS — I10 Essential (primary) hypertension: Secondary | ICD-10-CM | POA: Diagnosis not present

## 2023-10-20 DIAGNOSIS — T500X5A Adverse effect of mineralocorticoids and their antagonists, initial encounter: Secondary | ICD-10-CM | POA: Insufficient documentation

## 2023-10-20 DIAGNOSIS — T465X5D Adverse effect of other antihypertensive drugs, subsequent encounter: Secondary | ICD-10-CM | POA: Diagnosis not present

## 2023-10-20 DIAGNOSIS — T465X5A Adverse effect of other antihypertensive drugs, initial encounter: Secondary | ICD-10-CM | POA: Insufficient documentation

## 2023-10-20 DIAGNOSIS — N179 Acute kidney failure, unspecified: Secondary | ICD-10-CM | POA: Insufficient documentation

## 2023-10-20 DIAGNOSIS — R600 Localized edema: Secondary | ICD-10-CM

## 2023-10-20 DIAGNOSIS — T500X5D Adverse effect of mineralocorticoids and their antagonists, subsequent encounter: Secondary | ICD-10-CM | POA: Diagnosis not present

## 2023-10-20 DIAGNOSIS — N183 Chronic kidney disease, stage 3 unspecified: Secondary | ICD-10-CM | POA: Diagnosis not present

## 2023-10-20 DIAGNOSIS — Z79899 Other long term (current) drug therapy: Secondary | ICD-10-CM

## 2023-10-20 LAB — CYSTATIN C WITH GLOMERULAR FILTRATION RATE, ESTIMATED (EGFR)
CYSTATIN C: 1.33 mg/L — ABNORMAL HIGH (ref 0.52–1.20)
eGFR: 52 mL/min/1.73m2 — ABNORMAL LOW (ref >=60)

## 2023-10-20 MED ORDER — EMPAGLIFLOZIN 25 MG PO TABS: 25.0000 mg | ORAL_TABLET | Freq: Every day | ORAL | 3 refills | Status: DC

## 2023-10-20 NOTE — Assessment & Plan Note (Signed)
 Chronic kidney disease, stage 3a   Chronic kidney disease is stable with an eGFR of 52, indicating no significant damage. Previous fluctuations in kidney function were noted, but current stabilization allows for considering resuming Jardiance . Jardiance  has a protective role when kidneys are stable, but there are potential risks if kidneys are compromised. Coordinate with Dr. Tobie about resuming Jardiance  at a half dose initially. Order future blood work to monitor kidney function after medication adjustments.

## 2023-10-20 NOTE — Patient Instructions (Addendum)
 Ask DrRONITA Blanch if ok to resume lasix  and Jardiance  and increase toprol  It was a pleasure seeing you today! Your health and satisfaction are our top priorities.  Bernardino Cone, MD  VISIT SUMMARY: Today, you came in for a follow-up visit to discuss your blood pressure management and kidney function. We reviewed your current medications and overall health status.  YOUR PLAN: -HYPERTENSION: Hypertension means high blood pressure. Your blood pressure has been in the 130s and 140s since starting metoprolol 12.5 mg, which is a good improvement. We discussed the possibility of increasing the dose to 25 mg XL, but we need to monitor your heart rate closely. I recommend getting a continuous heart rate monitor, like a smartwatch or Fitbit, to keep track of your heart rate trends.  -LOWER EXTREMITY EDEMA: Lower extremity edema means swelling in your legs. You have mild swelling, and we are considering resuming Lasix , a medication that can help reduce this swelling and also manage your blood pressure. We will coordinate with Dr. Blanch about this.  -CHRONIC KIDNEY DISEASE, STAGE 3A: Chronic kidney disease stage 3a means your kidneys have moderate damage but are currently stable. Your eGFR is 52, which is a stable reading. We are considering resuming Jardiance  at a half dose, as it can protect your kidneys when they are stable. We will monitor your kidney function closely with future blood work after any medication changes.  -ADVERSE REACTION TO LOSARTAN  AND OLMESARTAN : You had a significant drop in kidney function when taking losartan  and olmesartan , so we will avoid these medications in the future.  -MEDICATION MANAGEMENT FOR COMPLEX POLYPHARMACY: You are on multiple medications for your conditions, which requires careful monitoring to avoid side effects and ensure effective treatment. We will coordinate with Dr. Blanch and Dr. Kirt regarding any medication adjustments, including possibly reducing your Adderall  dosage to help manage your blood pressure.  INSTRUCTIONS: Please follow up with Dr. Blanch to discuss the potential increase in metoprolol dosage, resuming Lasix , and starting Jardiance  at a half dose. Also, consider getting a continuous heart rate monitor to track your heart rate trends. We will order future blood work to monitor your kidney function after any medication adjustments. Ensure that Dr. Blanch receives all lab results and updates on your medication changes.  Your Providers PCP: Cone Bernardino MATSU, MD,  [phone]) Referring Provider: Cone Bernardino MATSU, MD,  [phone]) Care Team Provider: Jerri Kay HERO, MD,  [phone]) Care Team Provider: [name], MD,  [phone]) Care Team Provider: Persons, [name], [place],  [phone]) Care Team Provider: Blanch Gordy POUR, MD,  [phone])  NEXT STEPS: [x]  Early Intervention: Schedule sooner appointment, call our on-call services, or go to emergency room if there is any significant Increase in pain or discomfort New or worsening symptoms Sudden or severe changes in your health [x]  Flexible Follow-Up: We recommend a No follow-ups on file. for optimal routine care. This allows for progress monitoring and treatment adjustments. [x]  Preventive Care: Schedule your annual preventive care visit! It's typically covered by insurance and helps identify potential health issues early. [x]  Lab & X-ray Appointments: Incomplete tests scheduled today, or call to schedule. X-rays: [facility] (M-F, 8:30am-noon or 1pm-5pm). [x]  Medical Information Release: Sign a release form at front desk to obtain relevant medical information we don't have.  MAKING THE MOST OF OUR FOCUSED 20 MINUTE APPOINTMENTS: [x]   Clearly state your top concerns at the beginning of the visit to focus our discussion [x]   If you anticipate you  will need more time, please inform the front desk during scheduling - we can book multiple appointments in the same  week. [x]   If you have transportation problems- use our convenient video appointments or ask about transportation support. [x]   We can get down to business faster if you use MyChart to update information before the visit and submit non-urgent questions before your visit. Thank you for taking the time to provide details through MyChart.  Let our nurse know and she can import this information into your encounter documents.  Arrival and Wait Times: [x]   Arriving on time ensures that everyone receives prompt attention. [x]   Early morning (8a) and afternoon (1p) appointments tend to have shortest wait times. [x]   Unfortunately, we cannot delay appointments for late arrivals or hold slots during phone calls.  Getting Answers and Following Up [x]   Simple Questions & Concerns: For quick questions or basic follow-up after your visit, reach us  at [phone] or MyChart messaging. [x]   Complex Concerns: If your concern is more complex, scheduling an appointment might be best. Discuss this with the staff to find the most suitable option. [x]   Lab & Imaging Results: We'll contact you directly if results are abnormal or you don't use MyChart. Most normal results will be on MyChart within 2-3 business days, with a review message from Dr. Jesus. Haven't heard back in 2 weeks? Need results sooner? Contact us  at [phone]. [x]   Referrals: Our referral coordinator will manage specialist referrals. The specialist's office should contact you within 2 weeks to schedule an appointment. Call us  if you haven't heard from them after 2 weeks.  Staying Connected [x]   MyChart: Activate your MyChart for the fastest way to access results and message us . See the last page of this paperwork for instructions on how to activate.  Bring to Your Next Appointment [x]   Medications: Please bring all your medication bottles to your next appointment to ensure we have an accurate record of your prescriptions. [x]   Health  Diaries: If you're monitoring any health conditions at home, keeping a diary of your readings can be very helpful for discussions at your next appointment.  Billing [x]   X-ray & Lab Orders: These are billed by separate companies. Contact the invoicing company directly for questions or concerns. [x]   Visit Charges: Discuss any billing inquiries with our administrative services team.  Your Satisfaction Matters [x]   Share Your Experience: We strive for your satisfaction! If you have any complaints, or preferably compliments, please let Dr. Jesus know directly or contact our Practice Administrators, Manuelita Rubin or [employer], by asking at the front desk.   Reviewing Your Records [x]   Review this early draft of your clinical encounter notes below and the final encounter summary tomorrow on MyChart after its been completed.  All orders placed so far are visible here: Acute kidney injury -     Empagliflozin ; Take 1 tablet (25 mg total) by mouth daily before breakfast.  Dispense: 90 tablet; Refill: 3 -     Protein / creatinine ratio, urine; Future -     Cystatin C with Glomerular Filtration Rate, Estimated (eGFR); Future -     Basic metabolic panel with GFR; Future  Chronic kidney disease (CKD), active medical management without dialysis, stage 3 (moderate) (HCC) -     Empagliflozin ; Take 1 tablet (25 mg total) by mouth daily before breakfast.  Dispense: 90 tablet; Refill: 3 -     Protein / creatinine ratio, urine; Future -  Cystatin C with Glomerular Filtration Rate, Estimated (eGFR); Future -     Basic metabolic panel with GFR; Future  Primary hypertension  Lower extremity edema  Adverse effect of angiotensin 2 receptor antagonist, subsequent encounter  Adverse effect of spironolactone , subsequent encounter  Medication management

## 2023-10-20 NOTE — Assessment & Plan Note (Signed)
 Medication management for complex polypharmacy   He is on a complex polypharmacy regimen for hypertension, kidney disease, and mental health, including metoprolol and doxazosin . Careful monitoring and coordination with specialists are essential to avoid adverse effects and ensure effective treatment. Coordinate with Dr. Tobie regarding all medication adjustments, including Jardiance , Lasix , and metoprolol. Consider reducing Adderall dosage to help manage blood pressure, in consultation with Dr. Kirt. Ensure Dr. Tobie receives all lab results and updates on medication changes.

## 2023-10-20 NOTE — Progress Notes (Signed)
 ==============================  [facility]: [phone]   -- Medical Office Visit --  Patient: Eric Crane      Age: 68 y.o.       Sex:  male  Date:   10/20/2023 Today's Healthcare Provider: Bernardino KANDICE Cone, MD  ==============================   Chief Complaint: Hypertension (Dr kenney put him on new medication last Friday.)  Discussed the use of AI scribe software for clinical note transcription with the patient, who gave verbal consent to proceed.  History of Present Illness  68 year old male with hypertension and chronic kidney disease who presents for follow-up on blood pressure management and kidney function.  He started taking metoprolol 12.5 mg four days ago, which has stabilized his blood pressure in the 130s and 140s. He takes the medication at night and has not experienced any side effects, maintaining good energy levels. Previously, he avoided metoprolol due to concerns about its impact on his hockey activities.  He has a history of fluctuating kidney function, with a recent eGFR of 52, stable compared to previous readings. In July, his eGFR dropped to 25, associated with proteinuria, attributed to medications like olmesartan  and losartan .  He is currently not taking Lasix  and minoxidil , which were previously used for blood pressure management. He reports slight ankle swelling but no significant issues. His current medications include metoprolol 12.5 mg, desoxyn 4 mg twice a day, lamotrigine , lurasidone, Seroquel , and rosuvastatin . He is not taking amlodipine  or losartan  due to adverse effects on his kidney function. His heart rate has remained stable in the 50s since starting metoprolol.  Socially, he is active, having recently pressure washed his house and cut down trees, and does not consider himself frail. He mentions that he does not wear a watch and has not done so for 40 years.  Lab Results  Component Value Date   GFR 47.38 (L)  10/18/2023   GFR 42.33 (L) 10/06/2023   GFR 42.03 (L) 10/01/2023   GFR 51.44 (L) 09/15/2023   GFR 51.03 (L) 08/30/2023   GFR 46.69 (L) 08/23/2023   GFR 25.74 (L) 08/19/2023   GFR 52.36 (L) 08/11/2023   GFR 51.92 (L) 08/05/2023   GFR 47.45 (L) 07/29/2023   GFR 32.95 (L) 07/23/2023   GFR 50.23 (L) 07/09/2023   GFR 50.24 (L) 06/30/2023   GFR 40.95 (L) 06/07/2023   GFR 46.77 (L) 05/28/2023   GFR 51.13 (L) 05/17/2023   GFR 64.40 05/10/2023   GFR 52.02 (L) 05/03/2023   GFR 39.87 (L) 04/26/2023   GFR 46.44 (L) 04/23/2023   GFR 58.44 (L) 04/09/2023   GFR 53.88 (L) 03/26/2023   GFR 61.76 03/23/2022   .trendp  Background Reviewed: Problem List: has Primary osteoarthritis of right knee; Weight disorder; FH: heart disease; Osteoarthritis of lower back; Hyperlipidemia, acquired; Thrombocytopenia; Attention deficit hyperactivity disorder (ADHD); Bipolar 2 disorder (HCC); H/O total knee replacement; Prediabetes; Statin intolerance; Status post total right knee replacement; Pain in left hip; Medication management; Chronic kidney disease (CKD), active medical management without dialysis, stage 3 (moderate) (HCC); Hypertension; Coronary artery calcification; Elevated serum creatinine; Nephrolithiasis; Gallstones; Injury to kidney; Elevated PSA; Polycystic kidney disease; Adverse reaction to angiotensin 2 receptor antagonist; Spironolactone  adverse reaction; Lower extremity edema; and Acute kidney injury on their problem list. Past Medical History:  has a past medical history of Arthritis (2021), Bipolar 2 disorder (HCC) (11/20/2021), Chronic kidney disease (03/26/2023), Depression, FH: heart disease (11/20/2021), Hypertension (03/25/2024), Osteoarthritis of lower back (11/20/2021), Overweight (11/20/2021), and Thrombocytopenia (11/20/2021).  Past Surgical History:   has a past surgical history that includes Colonoscopy with propofol  (N/A, 02/18/2016); Tonsillectomy; and Total knee arthroplasty (Right,  02/02/2022). Social History:   reports that he has never smoked. He has never used smokeless tobacco. He reports that he does not drink alcohol and does not use drugs. Family History:  family history includes Heart disease in his mother. Allergies:  is allergic to ace inhibitors and angiotensin receptor blockers.   Medication Reconciliation: Current Outpatient Medications on File Prior to Visit  Medication Sig   doxazosin  (CARDURA ) 4 MG tablet Take 4 mg by mouth 2 (two) times daily.   metoprolol succinate (TOPROL-XL) 25 MG 24 hr tablet Take 12.5 mg by mouth daily.   rosuvastatin  (CRESTOR ) 20 MG tablet Take 1 tablet (20 mg total) by mouth daily.   amphetamine -dextroamphetamine  (ADDERALL) 20 MG tablet Take 1 tablet (20 mg total) by mouth 3 (three) times daily. (10-06-23)   aspirin  EC 81 MG tablet Take 1 tablet (81 mg total) by mouth daily. Swallow whole.   lamoTRIgine  (LAMICTAL ) 200 MG tablet Take 200 mg by mouth daily.   Lurasidone HCl 120 MG TABS    QUEtiapine  Fumarate (SEROQUEL  XR) 150 MG 24 hr tablet Take 300 mg by mouth at bedtime.   No current facility-administered medications on file prior to visit.   Medications Discontinued During This Encounter  Medication Reason   amLODipine  (NORVASC ) 10 MG tablet    minoxidil  (LONITEN ) 2.5 MG tablet    empagliflozin  (JARDIANCE ) 25 MG TABS tablet Completed Course   furosemide  (LASIX ) 20 MG tablet    spironolactone -hydrochlorothiazide  (ALDACTAZIDE) 25-25 MG tablet    doxazosin  (CARDURA ) 1 MG tablet      Physical Exam:    10/20/2023   10:55 AM 10/01/2023    9:08 AM 09/21/2023    8:39 AM  Vitals with BMI  Height 5' 7 5' 7 5' 7  Weight 170 lbs 10 oz 165 lbs 13 oz 168 lbs  BMI 26.71 25.96 26.31  Systolic 130 146 873  Diastolic 72 76 70  Pulse 64 58 55  Vital signs reviewed.  Nursing notes reviewed. Weight trend reviewed. Physical Activity: Sufficiently Active (09/21/2023)   Exercise Vital Sign    Days of Exercise per Week: 5 days     Minutes of Exercise per Session: 40 min   General Appearance:  No acute distress appreciable.   Well-groomed, healthy-appearing male.  Well proportioned with no abnormal fat distribution.  Good muscle tone. Pulmonary:  Normal work of breathing at rest, no respiratory distress apparent. SpO2: 98 %  Musculoskeletal: All extremities are intact.  Neurological:  Awake, alert, oriented, and engaged.  No obvious focal neurological deficits or cognitive impairments.  Sensorium seems unclouded.   Speech is clear and coherent with logical content. Psychiatric:  Appropriate mood, pleasant and cooperative demeanor, thoughtful and engaged during the exam   Verbalized to patient: Physical Exam    Results:   Verbalized to patient: Results LABS eGFR: 52 (10/18/2023)     09/21/2023    8:47 AM 08/11/2023    8:04 AM 06/30/2023    8:48 AM 04/09/2023    8:47 AM  PHQ 2/9 Scores  PHQ - 2 Score 0 0 0 0          ASSESSMENT & PLAN   Assessment & Plan Chronic kidney disease (CKD), active medical management without dialysis, stage 3 (moderate) (HCC) Chronic kidney disease, stage 3a   Chronic kidney disease is stable with an eGFR of 52,  indicating no significant damage. Previous fluctuations in kidney function were noted, but current stabilization allows for considering resuming Jardiance . Jardiance  has a protective role when kidneys are stable, but there are potential risks if kidneys are compromised. Coordinate with Dr. Tobie about resuming Jardiance  at a half dose initially. Order future blood work to monitor kidney function after medication adjustments. Acute kidney injury  Primary hypertension Hypertension, difficult to control   Hypertension remains challenging to manage, with recent blood pressure readings in the 130s and 140s. Metoprolol 12.5 mg effectively reduces blood pressure without significant side effects, and heart rate is stable in the 50s. Increasing the metoprolol dose was discussed,  but there are concerns about a potential drop in heart rate. Coordinate with Dr. Tobie about increasing metoprolol to 25 mg XL. Recommend obtaining a continuous heart rate monitor, such as a smartwatch or Fitbit, to assess heart rate trends. Lower extremity edema Lower extremity edema   Mild lower extremity edema is present. Lasix  was previously discontinued, but recent kidney function tests suggest it may be safe to resume. Lasix  could aid in managing both edema and blood pressure. Coordinate with Dr. Tobie about resuming Lasix . Adverse effect of angiotensin 2 receptor antagonist, subsequent encounter Adverse effect of spironolactone , subsequent encounter Medication management Medication management for complex polypharmacy   He is on a complex polypharmacy regimen for hypertension, kidney disease, and mental health, including metoprolol and doxazosin . Careful monitoring and coordination with specialists are essential to avoid adverse effects and ensure effective treatment. Coordinate with Dr. Tobie regarding all medication adjustments, including Jardiance , Lasix , and metoprolol. Consider reducing Adderall dosage to help manage blood pressure, in consultation with Dr. Kirt. Ensure Dr. Tobie receives all lab results and updates on medication changes.   ORDER ASSOCIATIONS  #   DIAGNOSIS / CONDITION ICD-10 ENCOUNTER ORDER     ICD-10-CM   1. Acute kidney injury  N17.9 empagliflozin  (JARDIANCE ) 25 MG TABS tablet    Protein / creatinine ratio, urine    Cystatin C with Glomerular Filtration Rate, Estimated (eGFR)    Basic metabolic panel with GFR    2. Chronic kidney disease (CKD), active medical management without dialysis, stage 3 (moderate) (HCC)  N18.30 empagliflozin  (JARDIANCE ) 25 MG TABS tablet    Protein / creatinine ratio, urine    Cystatin C with Glomerular Filtration Rate, Estimated (eGFR)    Basic metabolic panel with GFR    3. Primary hypertension  I10     4. Lower extremity edema   R60.0     5. Adverse effect of angiotensin 2 receptor antagonist, subsequent encounter  T46.5X5D     6. Adverse effect of spironolactone , subsequent encounter  T50.0X5D     7. Medication management  Z79.899            Orders Placed in Encounter:   Lab Orders         Protein / creatinine ratio, urine         Cystatin C with Glomerular Filtration Rate, Estimated (eGFR)         Basic metabolic panel with GFR     Meds ordered this encounter  Medications   empagliflozin  (JARDIANCE ) 25 MG TABS tablet    Sig: Take 1 tablet (25 mg total) by mouth daily before breakfast.    Dispense:  90 tablet    Refill:  3    Team Member Role and Facilities manager Address Start End Comments Share results to: [name], MD Consulting Physician (Nephrology) Phone: [phone] Fax:  [phone] [address] [place] [address] 07/09/2023 - -        This document was synthesized by artificial intelligence ([employer]) using HIPAA-compliant recording of the clinical interaction;   We discussed the use of AI scribe software for clinical note transcription with the patient, who gave verbal consent to proceed. additional Info: This encounter employed state-of-the-art, real-time, collaborative documentation. The patient actively reviewed and assisted in updating their electronic medical record on a shared screen, ensuring transparency and facilitating joint problem-solving for the problem list, overview, and plan. This approach promotes accurate, informed care. The treatment plan was discussed and reviewed in detail, including medication safety, potential side effects, and all patient questions. We confirmed understanding and comfort with the plan. Follow-up instructions were established, including contacting the office for any concerns, returning if symptoms worsen, persist, or new symptoms develop, and precautions for potential emergency department visits.

## 2023-10-20 NOTE — Assessment & Plan Note (Signed)
 Hypertension, difficult to control   Hypertension remains challenging to manage, with recent blood pressure readings in the 130s and 140s. Metoprolol 12.5 mg effectively reduces blood pressure without significant side effects, and heart rate is stable in the 50s. Increasing the metoprolol dose was discussed, but there are concerns about a potential drop in heart rate. Coordinate with Dr. Tobie about increasing metoprolol to 25 mg XL. Recommend obtaining a continuous heart rate monitor, such as a smartwatch or Fitbit, to assess heart rate trends.

## 2023-10-20 NOTE — Assessment & Plan Note (Signed)
 Lower extremity edema   Mild lower extremity edema is present. Lasix  was previously discontinued, but recent kidney function tests suggest it may be safe to resume. Lasix  could aid in managing both edema and blood pressure. Coordinate with Dr. Tobie about resuming Lasix .

## 2023-10-21 ENCOUNTER — Ambulatory Visit: Payer: Self-pay | Admitting: Internal Medicine

## 2023-10-25 ENCOUNTER — Other Ambulatory Visit (HOSPITAL_BASED_OUTPATIENT_CLINIC_OR_DEPARTMENT_OTHER): Payer: Self-pay

## 2023-10-25 MED ORDER — AMPHETAMINE-DEXTROAMPHETAMINE 20 MG PO TABS
20.0000 mg | ORAL_TABLET | Freq: Three times a day (TID) | ORAL | 0 refills | Status: DC
  Filled 2023-11-05: qty 90, 30d supply, fill #0

## 2023-11-01 ENCOUNTER — Other Ambulatory Visit (INDEPENDENT_AMBULATORY_CARE_PROVIDER_SITE_OTHER)

## 2023-11-01 DIAGNOSIS — I1A0 Resistant hypertension: Secondary | ICD-10-CM

## 2023-11-01 DIAGNOSIS — N179 Acute kidney failure, unspecified: Secondary | ICD-10-CM | POA: Diagnosis not present

## 2023-11-01 DIAGNOSIS — N183 Chronic kidney disease, stage 3 unspecified: Secondary | ICD-10-CM | POA: Diagnosis not present

## 2023-11-02 LAB — BASIC METABOLIC PANEL WITH GFR
BUN: 17 mg/dL (ref 6–23)
CO2: 24 meq/L (ref 19–32)
Calcium: 8.9 mg/dL (ref 8.4–10.5)
Chloride: 106 meq/L (ref 96–112)
Creatinine, Ser: 1.42 mg/dL (ref 0.40–1.50)
GFR: 51.4 mL/min — ABNORMAL LOW (ref >60.00)
Glucose, Bld: 135 mg/dL — ABNORMAL HIGH (ref 70–99)
Potassium: 4.2 meq/L (ref 3.5–5.1)
Sodium: 140 meq/L (ref 135–145)

## 2023-11-02 LAB — MICROALBUMIN / CREATININE URINE RATIO
Creatinine,U: 175.9 mg/dL
Microalb Creat Ratio: 7.4 mg/g (ref 0.0–30.0)
Microalb, Ur: 1.3 mg/dL (ref 0.0–1.9)

## 2023-11-02 LAB — PROTEIN / CREATININE RATIO, URINE
Creatinine, Urine: 176 mg/dL (ref 20–320)
Protein/Creat Ratio: 63 mg/g{creat} (ref 25–148)
Protein/Creatinine Ratio: 0.063 mg/mg{creat} (ref 0.025–0.148)
Total Protein, Urine: 11 mg/dL (ref 5–25)

## 2023-11-04 ENCOUNTER — Ambulatory Visit: Payer: Self-pay | Admitting: Internal Medicine

## 2023-11-04 DIAGNOSIS — N1832 Chronic kidney disease, stage 3b: Secondary | ICD-10-CM | POA: Diagnosis not present

## 2023-11-04 DIAGNOSIS — I129 Hypertensive chronic kidney disease with stage 1 through stage 4 chronic kidney disease, or unspecified chronic kidney disease: Secondary | ICD-10-CM | POA: Diagnosis not present

## 2023-11-04 LAB — CYSTATIN C WITH GLOMERULAR FILTRATION RATE, ESTIMATED (EGFR)
CYSTATIN C: 1.43 mg/L — ABNORMAL HIGH (ref 0.52–1.20)
eGFR: 47 mL/min/1.73m2 — ABNORMAL LOW (ref >=60)

## 2023-11-05 ENCOUNTER — Other Ambulatory Visit (HOSPITAL_BASED_OUTPATIENT_CLINIC_OR_DEPARTMENT_OTHER): Payer: Self-pay

## 2023-11-11 ENCOUNTER — Other Ambulatory Visit (HOSPITAL_BASED_OUTPATIENT_CLINIC_OR_DEPARTMENT_OTHER): Payer: Self-pay

## 2023-11-17 ENCOUNTER — Encounter: Payer: Self-pay | Admitting: Internal Medicine

## 2023-11-17 ENCOUNTER — Ambulatory Visit (INDEPENDENT_AMBULATORY_CARE_PROVIDER_SITE_OTHER): Admitting: Internal Medicine

## 2023-11-17 ENCOUNTER — Ambulatory Visit: Payer: Self-pay | Admitting: Internal Medicine

## 2023-11-17 VITALS — BP 120/58 | HR 78 | Temp 98.0°F | Ht 67.0 in | Wt 174.0 lb

## 2023-11-17 DIAGNOSIS — N179 Acute kidney failure, unspecified: Secondary | ICD-10-CM | POA: Diagnosis not present

## 2023-11-17 DIAGNOSIS — I10 Essential (primary) hypertension: Secondary | ICD-10-CM | POA: Diagnosis not present

## 2023-11-17 DIAGNOSIS — N183 Chronic kidney disease, stage 3 unspecified: Secondary | ICD-10-CM | POA: Diagnosis not present

## 2023-11-17 DIAGNOSIS — I1A0 Resistant hypertension: Secondary | ICD-10-CM | POA: Diagnosis not present

## 2023-11-17 LAB — BASIC METABOLIC PANEL WITH GFR
BUN: 20 mg/dL (ref 6–23)
CO2: 26 meq/L (ref 19–32)
Calcium: 9 mg/dL (ref 8.4–10.5)
Chloride: 104 meq/L (ref 96–112)
Creatinine, Ser: 1.38 mg/dL (ref 0.40–1.50)
GFR: 53.17 mL/min — ABNORMAL LOW (ref >60.00)
Glucose, Bld: 98 mg/dL (ref 70–99)
Potassium: 4.2 meq/L (ref 3.5–5.1)
Sodium: 138 meq/L (ref 135–145)

## 2023-11-17 NOTE — Assessment & Plan Note (Signed)
 Management is complicated by previous acute kidney injury. Jardiance  was held due to kidney injury. Current labs are needed to assess kidney function and determine if Jardiance  can be resumed. Previous labs showed proteinuria and hypokalemia on diuretics. Order acute kidney injury panel to assess kidney function, urine protein test to evaluate proteinuria, and recheck potassium levels. Consider resuming Jardiance  if kidney function has stabilized, pending lab results.

## 2023-11-17 NOTE — Assessment & Plan Note (Signed)
 Blood pressure remains elevated despite metoprolol 12.5 mg BID and doxazosin  4 mg BID, with home readings consistently in the 140s-150s/80s range. Office reading was 120/58, lower than usual. Increasing metoprolol is not advised due to bradycardia (40s-50s). Amlodipine  was previously discontinued when metoprolol started. Discussed potential side effects of increasing metoprolol and resuming amlodipine . Adderall may contribute, but discontinuation is not preferred. Contact nephrologist to discuss resuming amlodipine . Maintain current metoprolol dosage. Submit home blood pressure readings to nephrologist. Consider restarting amlodipine  after nephrologist's approval.

## 2023-11-17 NOTE — Progress Notes (Signed)
 ==============================  [name]: [phone]   -- Medical Office Visit --  Patient: Eric Crane      Age: 68 y.o.       Sex:  male  Date:   11/17/2023 Today's Healthcare Provider: Bernardino KANDICE Cone, MD  ==============================   Chief Complaint: Hypertension  Discussed the use of AI scribe software for clinical note transcription with the patient, who gave verbal consent to proceed.  History of Present Illness 68 year old male with hypertension who presents with elevated blood pressure.  His blood pressure remains elevated despite being on metoprolol 12.5 mg twice daily, with readings consistently in the 140s to 150s range. His pulse rate is stable in the 50s, which is typical for him. During his last visit on October 2nd, the metoprolol dosage was increased from once daily to twice daily, but this change has not significantly improved his blood pressure control.  He is not currently taking Lasix  and has stopped Jardiance  due to concerns about kidney injury. He is currently taking doxazosin  4 mg twice daily along with metoprolol and has not experienced any adverse reactions such as lightheadedness or dizziness. Blood pressure readings vary depending on the time of day, with better readings in the morning.  His last blood work was done two weeks ago. He is awaiting further lab results to determine if he can resume Jardiance . No symptoms of sleep apnea, does not consume alcohol, and avoids NSAIDs and excessive salt intake. He continues to play hockey and maintains an active lifestyle.  Background Reviewed: Problem List: has Primary osteoarthritis of right knee; Weight disorder; FH: heart disease; Osteoarthritis of lower back; Hyperlipidemia, acquired; Thrombocytopenia; Attention deficit hyperactivity disorder (ADHD); Bipolar 2 disorder (HCC); H/O total knee replacement; Prediabetes; Statin intolerance; Status post total right knee  replacement; Pain in left hip; Medication management; Chronic kidney disease (CKD), active medical management without dialysis, stage 3 (moderate) (HCC); Hypertension; Coronary artery calcification; Elevated serum creatinine; Nephrolithiasis; Gallstones; Injury to kidney; Elevated PSA; Polycystic kidney disease; Adverse reaction to angiotensin 2 receptor antagonist; Spironolactone  adverse reaction; Lower extremity edema; and Acute kidney injury on their problem list. Past Medical History:  has a past medical history of Arthritis (2021), Bipolar 2 disorder (HCC) (11/20/2021), Chronic kidney disease (03/26/2023), Depression, FH: heart disease (11/20/2021), Hypertension (03/25/2024), Osteoarthritis of lower back (11/20/2021), Overweight (11/20/2021), and Thrombocytopenia (11/20/2021). Past Surgical History:   has a past surgical history that includes Colonoscopy with propofol  (N/A, 02/18/2016); Tonsillectomy; and Total knee arthroplasty (Right, 02/02/2022). Social History:   reports that he has never smoked. He has never used smokeless tobacco. He reports that he does not drink alcohol and does not use drugs. Family History:  family history includes Heart disease in his mother. Allergies:  is allergic to ace inhibitors and angiotensin receptor blockers.   Medication Reconciliation: Current Outpatient Medications on File Prior to Visit  Medication Sig   metoprolol succinate (TOPROL-XL) 25 MG 24 hr tablet Take 12.5 mg by mouth 2 (two) times daily.   QUEtiapine  Fumarate (SEROQUEL  XR) 150 MG 24 hr tablet Take 300 mg by mouth at bedtime.   rosuvastatin  (CRESTOR ) 20 MG tablet Take 1 tablet (20 mg total) by mouth daily.   amphetamine -dextroamphetamine  (ADDERALL) 20 MG tablet Take 1 tablet (20 mg total) by mouth 3 (three) times daily. (10-06-23)   amphetamine -dextroamphetamine  (ADDERALL) 20 MG tablet Take 1 tablet (20 mg total) by mouth 3 (three) times daily.   aspirin  EC 81 MG tablet Take 1 tablet (  81 mg total) by  mouth daily. Swallow whole.   doxazosin  (CARDURA ) 4 MG tablet Take 4 mg by mouth 2 (two) times daily.   lamoTRIgine  (LAMICTAL ) 200 MG tablet Take 200 mg by mouth daily.   Lurasidone HCl 120 MG TABS    No current facility-administered medications on file prior to visit.   Medications Discontinued During This Encounter  Medication Reason   empagliflozin  (JARDIANCE ) 25 MG TABS tablet      Physical Exam:    11/17/2023    8:37 AM 10/20/2023   10:55 AM 10/01/2023    9:08 AM  Vitals with BMI  Height 5' 7 5' 7 5' 7  Weight 174 lbs 170 lbs 10 oz 165 lbs 13 oz  BMI 27.25 26.71 25.96  Systolic 120 130 853  Diastolic 58 72 76  Pulse 78 64 58  Vital signs reviewed.  Nursing notes reviewed. Weight trend reviewed. Physical Activity: Sufficiently Active (11/16/2023)   Exercise Vital Sign    Days of Exercise per Week: 5 days    Minutes of Exercise per Session: 30 min   General Appearance:  No acute distress appreciable.   Well-groomed, healthy-appearing male.  Well proportioned with no abnormal fat distribution.  Good muscle tone. Pulmonary:  Normal work of breathing at rest, no respiratory distress apparent. SpO2: 98 %  Musculoskeletal: All extremities are intact.  Neurological:  Awake, alert, oriented, and engaged.  No obvious focal neurological deficits or cognitive impairments.  Sensorium seems unclouded.   Speech is clear and coherent with logical content. Psychiatric:  Appropriate mood, pleasant and cooperative demeanor, thoughtful and engaged during the exam     09/21/2023    8:47 AM 08/11/2023    8:04 AM 06/30/2023    8:48 AM 04/09/2023    8:47 AM  PHQ 2/9 Scores  PHQ - 2 Score 0 0 0 0          ASSESSMENT & PLAN   Assessment & Plan Acute kidney injury Chronic kidney disease (CKD), active medical management without dialysis, stage 3 (moderate) (HCC) Management is complicated by previous acute kidney injury. Jardiance  was held due to kidney injury. Current labs are needed to  assess kidney function and determine if Jardiance  can be resumed. Previous labs showed proteinuria and hypokalemia on diuretics. Order acute kidney injury panel to assess kidney function, urine protein test to evaluate proteinuria, and recheck potassium levels. Consider resuming Jardiance  if kidney function has stabilized, pending lab results. Resistant hypertension Primary hypertension Blood pressure remains elevated despite metoprolol 12.5 mg BID and doxazosin  4 mg BID, with home readings consistently in the 140s-150s/80s range. Office reading was 120/58, lower than usual. Increasing metoprolol is not advised due to bradycardia (40s-50s). Amlodipine  was previously discontinued when metoprolol started. Discussed potential side effects of increasing metoprolol and resuming amlodipine . Adderall may contribute, but discontinuation is not preferred. Contact nephrologist to discuss resuming amlodipine . Maintain current metoprolol dosage. Submit home blood pressure readings to nephrologist. Consider restarting amlodipine  after nephrologist's approval.   ORDER ASSOCIATIONS  #   DIAGNOSIS / CONDITION ICD-10 ENCOUNTER ORDER     ICD-10-CM   1. Acute kidney injury  N17.9 Protein / creatinine ratio, urine    Cystatin C with Glomerular Filtration Rate, Estimated (eGFR)    Basic metabolic panel with GFR    Urinalysis w microscopic + reflex cultur    Basic metabolic panel with GFR    Cystatin C with Glomerular Filtration Rate, Estimated (eGFR)    2. Primary hypertension  I10 Basic  metabolic panel with GFR    Basic metabolic panel with GFR    3. Resistant hypertension  I1A.0 Microalbumin / creatinine urine ratio    Microalbumin / creatinine urine ratio    4. Chronic kidney disease (CKD), active medical management without dialysis, stage 3 (moderate) (HCC)  N18.30 Microalbumin / creatinine urine ratio    Microalbumin / creatinine urine ratio           Orders Placed in Encounter:   Lab Orders          Protein / creatinine ratio, urine         Cystatin C with Glomerular Filtration Rate, Estimated (eGFR)         Basic metabolic panel with GFR         Urinalysis w microscopic + reflex cultur      Orders Placed This Encounter  Procedures   Protein / creatinine ratio, urine   Cystatin C with Glomerular Filtration Rate, Estimated (eGFR)    Standing Status:   Future    Number of Occurrences:   1    Expiration Date:   11/16/2024   Basic metabolic panel with GFR    Standing Status:   Future    Number of Occurrences:   1    Expiration Date:   11/16/2024   Urinalysis w microscopic + reflex cultur     This document was synthesized by artificial intelligence ([employer]) using HIPAA-compliant recording of the clinical interaction;   We discussed the use of AI scribe software for clinical note transcription with the patient, who gave verbal consent to proceed. additional Info: This encounter employed state-of-the-art, real-time, collaborative documentation. The patient actively reviewed and assisted in updating their electronic medical record on a shared screen, ensuring transparency and facilitating joint problem-solving for the problem list, overview, and plan. This approach promotes accurate, informed care. The treatment plan was discussed and reviewed in detail, including medication safety, potential side effects, and all patient questions. We confirmed understanding and comfort with the plan. Follow-up instructions were established, including contacting the office for any concerns, returning if symptoms worsen, persist, or new symptoms develop, and precautions for potential emergency department visits.

## 2023-11-17 NOTE — Patient Instructions (Signed)
 It was a pleasure seeing you today! Your health and satisfaction are our top priorities.  Eric Cone, MD  VISIT SUMMARY: Today, we discussed your elevated blood pressure and reviewed your current medications. We also talked about your chronic kidney disease and the steps needed to manage it effectively.  YOUR PLAN: -PRIMARY HYPERTENSION: Primary hypertension means that your blood pressure is consistently high without a known secondary cause. Your blood pressure remains elevated despite your current medications. We discussed the potential side effects of increasing your metoprolol dosage and the possibility of resuming amlodipine  after consulting with your nephrologist. For now, continue taking metoprolol 12.5 mg twice daily and doxazosin  4 mg twice daily. Please submit your home blood pressure readings to your nephrologist.  -CHRONIC KIDNEY DISEASE, STAGE 3A: Chronic kidney disease stage 3a means that your kidneys are moderately damaged and not working as well as they should. We need to check your current kidney function and other related lab results to determine if you can resume taking Jardiance . We will order tests to assess your kidney function, check for protein in your urine, and recheck your potassium levels. Based on these results, we will decide if you can restart Jardiance .  INSTRUCTIONS: Please continue to monitor your blood pressure at home and submit your readings to your nephrologist. We will order lab tests to check your kidney function, urine protein, and potassium levels. Follow up with your nephrologist to discuss the possibility of resuming amlodipine  and Jardiance  based on your lab results.  Your Providers PCP: Crane Eric MATSU, MD,  [phone]) Referring Provider: Cone Eric MATSU, MD,  [phone]) Care Team Provider: Jerri Kay HERO, MD,  [phone]) Care Team Provider: [name], MD,  [phone]) Care Team Provider: Persons, [name], [place],   [phone]) Care Team Provider: [name], MD,  [phone])  NEXT STEPS: [x]  Early Intervention: Schedule sooner appointment, call our on-call services, or go to emergency room if there is any significant Increase in pain or discomfort New or worsening symptoms Sudden or severe changes in your health [x]  Flexible Follow-Up: We recommend a No follow-ups on file. for optimal routine care. This allows for progress monitoring and treatment adjustments. [x]  Preventive Care: Schedule your annual preventive care visit! It's typically covered by insurance and helps identify potential health issues early. [x]  Lab & X-ray Appointments: Incomplete tests scheduled today, or call to schedule. X-rays:  [facility] (M-F, 8:30am-noon or 1pm-5pm). [x]  Medical Information Release: Sign a release form at front desk to obtain relevant medical information we don't have.  MAKING THE MOST OF OUR FOCUSED 20 MINUTE APPOINTMENTS: [x]   Clearly state your top concerns at the beginning of the visit to focus our discussion [x]   If you anticipate you will need more time, please inform the front desk during scheduling - we can book multiple appointments in the same week. [x]   If you have transportation problems- use our convenient video appointments or ask about transportation support. [x]   We can get down to business faster if you use MyChart to update information before the visit and submit non-urgent questions before your visit. Thank you for taking the time to provide details through MyChart.  Let our nurse know and she can import this information into your encounter documents.  Arrival and Wait Times: [x]   Arriving on time ensures that everyone receives prompt attention. [x]   Early morning (8a) and afternoon (1p) appointments tend to have shortest wait times. [x]   Unfortunately, we cannot delay appointments for late arrivals or  hold slots during phone calls.  Getting Answers and Following  Up [x]   Simple Questions & Concerns: For quick questions or basic follow-up after your visit, reach us  at [phone] or MyChart messaging. [x]   Complex Concerns: If your concern is more complex, scheduling an appointment might be best. Discuss this with the staff to find the most suitable option. [x]   Lab & Imaging Results: We'll contact you directly if results are abnormal or you don't use MyChart. Most normal results will be on MyChart within 2-3 business days, with a review message from Dr. Jesus. Haven't heard back in 2 weeks? Need results sooner? Contact us  at [phone]. [x]   Referrals: Our referral coordinator will manage specialist referrals. The specialist's office should contact you within 2 weeks to schedule an appointment. Call us  if you haven't heard from them after 2 weeks.  Staying Connected [x]   MyChart: Activate your MyChart for the fastest way to access results and message us . See the last page of this paperwork for instructions on how to activate.  Bring to Your Next Appointment [x]   Medications: Please bring all your medication bottles to your next appointment to ensure we have an accurate record of your prescriptions. [x]   Health Diaries: If you're monitoring any health conditions at home, keeping a diary of your readings can be very helpful for discussions at your next appointment.  Billing [x]   X-ray & Lab Orders: These are billed by separate companies. Contact the invoicing company directly for questions or concerns. [x]   Visit Charges: Discuss any billing inquiries with our administrative services team.  Your Satisfaction Matters [x]   Share Your Experience: We strive for your satisfaction! If you have any complaints, or preferably compliments, please let Dr. Jesus know directly or contact our Practice Administrators, Manuelita Rubin or [employer], by asking at the front desk.   Reviewing Your Records [x]   Review this early draft of your clinical encounter  notes below and the final encounter summary tomorrow on MyChart after its been completed.  All orders placed so far are visible here: Acute kidney injury -     Protein / creatinine ratio, urine -     Cystatin C with Glomerular Filtration Rate, Estimated (eGFR); Future -     Basic metabolic panel with GFR; Future -     Urinalysis w microscopic + reflex cultur  Primary hypertension -     Basic metabolic panel with GFR; Future  Resistant hypertension -     Microalbumin / creatinine urine ratio -     Microalbumin / creatinine urine ratio  Chronic kidney disease (CKD), active medical management without dialysis, stage 3 (moderate) (HCC) -     Microalbumin / creatinine urine ratio -     Microalbumin / creatinine urine ratio

## 2023-11-18 LAB — MICROALBUMIN / CREATININE URINE RATIO
Creatinine,U: 26.7 mg/dL
Microalb Creat Ratio: UNDETERMINED mg/g (ref 0.0–30.0)
Microalb, Ur: <0.7 mg/dL

## 2023-11-19 ENCOUNTER — Other Ambulatory Visit (HOSPITAL_COMMUNITY): Payer: Self-pay | Admitting: Nephrology

## 2023-11-19 DIAGNOSIS — I129 Hypertensive chronic kidney disease with stage 1 through stage 4 chronic kidney disease, or unspecified chronic kidney disease: Secondary | ICD-10-CM

## 2023-11-20 LAB — CYSTATIN C WITH GLOMERULAR FILTRATION RATE, ESTIMATED (EGFR)
CYSTATIN C: 1.33 mg/L — ABNORMAL HIGH (ref 0.52–1.20)
eGFR: 52 mL/min/1.73m2 — ABNORMAL LOW (ref >=60)

## 2023-11-21 LAB — URINALYSIS W MICROSCOPIC + REFLEX CULTURE
Bacteria, UA: NONE SEEN /HPF
Bilirubin Urine: NEGATIVE
Glucose, UA: NEGATIVE
Hgb urine dipstick: NEGATIVE
Hyaline Cast: NONE SEEN /LPF
Ketones, ur: NEGATIVE
Leukocyte Esterase: NEGATIVE
Nitrites, Initial: NEGATIVE
Protein, ur: NEGATIVE
Specific Gravity, Urine: 1.006 (ref 1.001–1.035)
Squamous Epithelial / HPF: NONE SEEN /HPF (ref <=5)
WBC, UA: NONE SEEN /HPF (ref 0–5)
pH: 6.5 (ref 5.0–8.0)

## 2023-11-21 LAB — PROTEIN / CREATININE RATIO, URINE
Creatinine, Urine: 29 mg/dL (ref 20–320)
Total Protein, Urine: <4 mg/dL — ABNORMAL LOW (ref 5–25)

## 2023-11-21 LAB — NO CULTURE INDICATED

## 2023-11-26 ENCOUNTER — Encounter: Payer: Self-pay | Admitting: Internal Medicine

## 2023-11-27 ENCOUNTER — Other Ambulatory Visit: Payer: Self-pay | Admitting: Internal Medicine

## 2023-11-27 DIAGNOSIS — N179 Acute kidney failure, unspecified: Secondary | ICD-10-CM

## 2023-11-29 ENCOUNTER — Encounter: Payer: Self-pay | Admitting: Radiology

## 2023-12-01 ENCOUNTER — Ambulatory Visit (HOSPITAL_COMMUNITY)
Admission: RE | Admit: 2023-12-01 | Discharge: 2023-12-01 | Disposition: A | Discharge status: Home / Self Care | Source: Ambulatory Visit | Attending: Nephrology | Admitting: Nephrology

## 2023-12-01 ENCOUNTER — Other Ambulatory Visit (INDEPENDENT_AMBULATORY_CARE_PROVIDER_SITE_OTHER)

## 2023-12-01 DIAGNOSIS — N179 Acute kidney failure, unspecified: Secondary | ICD-10-CM

## 2023-12-01 DIAGNOSIS — N183 Chronic kidney disease, stage 3 unspecified: Secondary | ICD-10-CM | POA: Diagnosis not present

## 2023-12-01 DIAGNOSIS — K8020 Calculus of gallbladder without cholecystitis without obstruction: Secondary | ICD-10-CM | POA: Diagnosis not present

## 2023-12-01 DIAGNOSIS — I129 Hypertensive chronic kidney disease with stage 1 through stage 4 chronic kidney disease, or unspecified chronic kidney disease: Secondary | ICD-10-CM | POA: Diagnosis not present

## 2023-12-01 DIAGNOSIS — I1A0 Resistant hypertension: Secondary | ICD-10-CM | POA: Diagnosis not present

## 2023-12-01 DIAGNOSIS — N281 Cyst of kidney, acquired: Secondary | ICD-10-CM | POA: Diagnosis not present

## 2023-12-01 LAB — BASIC METABOLIC PANEL WITH GFR
BUN: 19 mg/dL (ref 6–23)
CO2: 27 meq/L (ref 19–32)
Calcium: 9 mg/dL (ref 8.4–10.5)
Chloride: 105 meq/L (ref 96–112)
Creatinine, Ser: 1.41 mg/dL (ref 0.40–1.50)
GFR: 51.8 mL/min — ABNORMAL LOW (ref >60.00)
Glucose, Bld: 100 mg/dL — ABNORMAL HIGH (ref 70–99)
Potassium: 4.4 meq/L (ref 3.5–5.1)
Sodium: 139 meq/L (ref 135–145)

## 2023-12-01 LAB — MICROALBUMIN / CREATININE URINE RATIO
Creatinine,U: 59.4 mg/dL
Microalb Creat Ratio: UNDETERMINED mg/g (ref 0.0–30.0)
Microalb, Ur: <0.7 mg/dL

## 2023-12-01 MED ORDER — GADOBUTROL 1 MMOL/ML IV SOLN
8.0000 mL | Freq: Once | INTRAVENOUS | Status: AC | PRN
  Administered 2023-12-01: 8 mL via INTRAVENOUS

## 2023-12-02 LAB — PROTEIN / CREATININE RATIO, URINE
Creatinine, Urine: 63 mg/dL (ref 20–320)
Protein/Creat Ratio: 111 mg/g{creat} (ref 25–148)
Protein/Creatinine Ratio: 0.111 mg/mg{creat} (ref 0.025–0.148)
Total Protein, Urine: 7 mg/dL (ref 5–25)

## 2023-12-05 ENCOUNTER — Ambulatory Visit: Payer: Self-pay | Admitting: Internal Medicine

## 2023-12-14 ENCOUNTER — Telehealth: Payer: Self-pay | Admitting: Pharmacist

## 2023-12-14 MED ORDER — JARDIANCE 25 MG PO TABS: 25.0000 mg | ORAL_TABLET | Freq: Every day | ORAL | 1 refills | Status: DC

## 2023-12-14 MED ORDER — ROSUVASTATIN CALCIUM 20 MG PO TABS: 20.0000 mg | ORAL_TABLET | Freq: Every day | ORAL | 3 refills | Status: AC

## 2023-12-14 NOTE — Progress Notes (Signed)
 Pharmacy Quality Measure Review  This patient is appearing on a report for being at risk of failing the adherence measure for cholesterol (statin) and diabetes medications this calendar year.   Medication: rosuvastatin   Last fill date: 09/15/2023 for 90 day supply Does not look like he has refill remaining for rosuvastatin  - last filled by cardiology office. Will request refills from their office.   Medication: Jardiance  25mg   Last fill date: 11/18/2023 for 30 day supply Patient has been holding Jardiance  but was instructed to restart after his last appointment with Dr Jesus for cardio-renal benefits. Jardiance  is not on med list though - removed 10/21/2023. He will need updated Rx for next refill but next PCP visit is not until 12/27/2023. Will reach out to Dr Jesus to get updated Rx sent in.   Madelin Ray, PharmD Clinical Pharmacist [facility]  Population Health [phone]

## 2023-12-16 ENCOUNTER — Other Ambulatory Visit (HOSPITAL_BASED_OUTPATIENT_CLINIC_OR_DEPARTMENT_OTHER): Payer: Self-pay

## 2023-12-16 ENCOUNTER — Other Ambulatory Visit: Payer: Self-pay | Admitting: Internal Medicine

## 2023-12-16 DIAGNOSIS — I129 Hypertensive chronic kidney disease with stage 1 through stage 4 chronic kidney disease, or unspecified chronic kidney disease: Secondary | ICD-10-CM | POA: Diagnosis not present

## 2023-12-16 DIAGNOSIS — D631 Anemia in chronic kidney disease: Secondary | ICD-10-CM | POA: Diagnosis not present

## 2023-12-16 DIAGNOSIS — N1832 Chronic kidney disease, stage 3b: Secondary | ICD-10-CM | POA: Diagnosis not present

## 2023-12-16 DIAGNOSIS — N183 Chronic kidney disease, stage 3 unspecified: Secondary | ICD-10-CM

## 2023-12-16 DIAGNOSIS — N2581 Secondary hyperparathyroidism of renal origin: Secondary | ICD-10-CM | POA: Diagnosis not present

## 2023-12-16 MED ORDER — AMPHETAMINE-DEXTROAMPHETAMINE 20 MG PO TABS: 20.0000 mg | ORAL_TABLET | Freq: Three times a day (TID) | ORAL | 0 refills | Status: DC

## 2023-12-16 MED ORDER — JARDIANCE 25 MG PO TABS: 25.0000 mg | ORAL_TABLET | Freq: Every day | ORAL | 4 refills | Status: AC

## 2023-12-16 MED ORDER — AMPHETAMINE-DEXTROAMPHETAMINE 20 MG PO TABS
20.0000 mg | ORAL_TABLET | Freq: Three times a day (TID) | ORAL | 0 refills | Status: DC
  Filled 2023-12-16: qty 90, 30d supply, fill #0

## 2023-12-16 MED ORDER — AMPHETAMINE-DEXTROAMPHETAMINE 20 MG PO TABS
20.0000 mg | ORAL_TABLET | Freq: Three times a day (TID) | ORAL | 0 refills | Status: AC
  Filled 2024-02-17: qty 90, 30d supply, fill #0

## 2023-12-17 ENCOUNTER — Encounter: Payer: Self-pay | Admitting: Neurosurgery

## 2023-12-17 ENCOUNTER — Encounter: Payer: Self-pay | Admitting: Nurse Practitioner

## 2023-12-27 ENCOUNTER — Encounter: Payer: Self-pay | Admitting: Internal Medicine

## 2023-12-27 ENCOUNTER — Ambulatory Visit: Admitting: Internal Medicine

## 2023-12-27 VITALS — BP 118/62 | HR 88 | Temp 98.4°F | Ht 67.0 in | Wt 173.0 lb

## 2023-12-27 DIAGNOSIS — N179 Acute kidney failure, unspecified: Secondary | ICD-10-CM

## 2023-12-27 DIAGNOSIS — N183 Chronic kidney disease, stage 3 unspecified: Secondary | ICD-10-CM

## 2023-12-27 DIAGNOSIS — N1831 Chronic kidney disease, stage 3a: Secondary | ICD-10-CM

## 2023-12-27 DIAGNOSIS — I10 Essential (primary) hypertension: Secondary | ICD-10-CM | POA: Diagnosis not present

## 2023-12-27 LAB — BASIC METABOLIC PANEL WITH GFR
BUN: 22 mg/dL (ref 6–23)
CO2: 26 meq/L (ref 19–32)
Calcium: 9.3 mg/dL (ref 8.4–10.5)
Chloride: 105 meq/L (ref 96–112)
Creatinine, Ser: 1.36 mg/dL (ref 0.40–1.50)
GFR: 54.07 mL/min — ABNORMAL LOW (ref >60.00)
Glucose, Bld: 81 mg/dL (ref 70–99)
Potassium: 4 meq/L (ref 3.5–5.1)
Sodium: 138 meq/L (ref 135–145)

## 2023-12-27 NOTE — Patient Instructions (Addendum)
 It was a pleasure seeing you today! Your health and satisfaction are our top priorities.  Bernardino Cone, MD  VISIT SUMMARY: Today, we discussed your erratic blood pressure readings and reviewed your current medications and routine. We also addressed your chronic kidney disease and how it relates to your blood pressure management. A plan was made to help better control your blood pressure and monitor your kidney function.  YOUR PLAN: -RESISTANT HYPERTENSION: Resistant hypertension means your blood pressure remains high despite taking multiple medications. We suspect that not resting before taking your readings may contribute to the higher numbers. You should rest for 5 minutes before measuring your blood pressure at home. We are considering changing your amlodipine  dose to 5 mg twice daily instead of 10 mg once daily. A blood test (BMP) has been ordered to monitor your kidney function. You received a handout on proper blood pressure measurement techniques. Please follow up in one month with your home blood pressure readings and bring your blood pressure cuff.  -CHRONIC KIDNEY DISEASE, STAGE 3: Chronic kidney disease stage 3 means your kidneys are moderately damaged and not working as well as they should. Your kidney function is stable, and there is no significant protein in your urine. Managing your blood pressure is crucial to prevent further kidney damage. We will continue with your current blood pressure medications and monitor your kidney function with a blood test (BMP).  INSTRUCTIONS: Please follow up in one month with your home blood pressure readings and bring your blood pressure cuff. A blood test (BMP) has been ordered to monitor your kidney function.  Your Providers PCP: Cone Bernardino MATSU, MD,  [phone]) Referring Provider: Cone Bernardino MATSU, MD,  [phone]) Care Team Provider: Jerri Kay HERO, MD,  [phone]) Care Team Provider: [name], MD,  [phone]) Care Team  Provider: Persons, [name], [place],  [phone]) Care Team Provider: [name], MD,  [phone])  NEXT STEPS: [x]  Early Intervention: Schedule sooner appointment, call our on-call services, or go to emergency room if there is any significant Increase in pain or discomfort New or worsening symptoms Sudden or severe changes in your health [x]  Flexible Follow-Up: We recommend a No follow-ups on file. for optimal routine care. This allows for progress monitoring and treatment adjustments. [x]  Preventive Care: Schedule your annual preventive care visit! It's typically covered by insurance and helps identify potential health issues early. [x]  Lab & X-ray Appointments: Incomplete tests scheduled today, or call to schedule. X-rays: [facility] (M-F, 8:30am-noon or 1pm-5pm). [x]  Medical Information Release: Sign a release form at front desk to obtain relevant medical information we don't have.  MAKING THE MOST OF OUR FOCUSED 20 MINUTE APPOINTMENTS: [x]   Clearly state your top concerns at the beginning of the visit to focus our discussion [x]   If you anticipate you will need more time, please inform the front desk during scheduling - we can book multiple appointments in the same week. [x]   If you have transportation problems- use our convenient video appointments or ask about transportation support. [x]   We can get down to business faster if you use MyChart to update information before the visit and submit non-urgent questions before your visit. Thank you for taking the time to provide details through MyChart.  Let our nurse know and she can import this information into your encounter documents.  Arrival and Wait Times: [x]   Arriving on time ensures that everyone receives prompt attention. [x]   Early morning (8a) and afternoon (1p) appointments tend  to have shortest wait times. [x]   Unfortunately, we cannot delay appointments for late arrivals or hold slots during phone  calls.  Getting Answers and Following Up [x]   Simple Questions & Concerns: For quick questions or basic follow-up after your visit, reach us  at [phone] or MyChart messaging. [x]   Complex Concerns: If your concern is more complex, scheduling an appointment might be best. Discuss this with the staff to find the most suitable option. [x]   Lab & Imaging Results: We'll contact you directly if results are abnormal or you don't use MyChart. Most normal results will be on MyChart within 2-3 business days, with a review message from Dr. Jesus. Haven't heard back in 2 weeks? Need results sooner? Contact us  at [phone]. [x]   Referrals: Our referral coordinator will manage specialist referrals. The specialist's office should contact you within 2 weeks to schedule an appointment. Call us  if you haven't heard from them after 2 weeks.  Staying Connected [x]   MyChart: Activate your MyChart for the fastest way to access results and message us . See the last page of this paperwork for instructions on how to activate.  Bring to Your Next Appointment [x]   Medications: Please bring all your medication bottles to your next appointment to ensure we have an accurate record of your prescriptions. [x]   Health Diaries: If you're monitoring any health conditions at home, keeping a diary of your readings can be very helpful for discussions at your next appointment.  Billing [x]   X-ray & Lab Orders: These are billed by separate companies. Contact the invoicing company directly for questions or concerns. [x]   Visit Charges: Discuss any billing inquiries with our administrative services team.  Your Satisfaction Matters [x]   Share Your Experience: We strive for your satisfaction! If you have any complaints, or preferably compliments, please let Dr. Jesus know directly or contact our Practice Administrators, Manuelita Rubin or [employer], by asking at the front desk.   Reviewing Your Records [x]   Review  this early draft of your clinical encounter notes below and the final encounter summary tomorrow on MyChart after its been completed.  All orders placed so far are visible here: Uncontrolled hypertension -     Protein / creatinine ratio, urine; Future -     Cystatin C with Glomerular Filtration Rate, Estimated (eGFR); Future -     Basic metabolic panel with GFR; Future  AKI (acute kidney injury) -     Protein / creatinine ratio, urine; Future -     Cystatin C with Glomerular Filtration Rate, Estimated (eGFR); Future -     Basic metabolic panel with GFR; Future  Acute kidney injury -     Protein / creatinine ratio, urine; Future -     Cystatin C with Glomerular Filtration Rate, Estimated (eGFR); Future -     Basic metabolic panel with GFR; Future

## 2023-12-27 NOTE — Assessment & Plan Note (Signed)
 Resistant hypertension   Blood pressure at home ranges from 140 to 170 mmHg, occasionally reaching 120 mmHg, while office readings are consistently around 118 mmHg. This discrepancy may be due to lack of relaxation before measurements. He is currently on metoprolol, amlodipine , and doxazosin , with previous trials of ARBs and spironolactone  proving unsuccessful. Suspected arterial stiffness, particularly in the renal arteries, may contribute to resistance, though there is no significant kidney injury despite occasional proteinuria. Ensure home blood pressure readings are taken after 5 minutes of rest. Consider switching amlodipine  to 5 mg twice daily instead of 10 mg once daily. A BMP is ordered to monitor kidney function. He received a handout on proper blood pressure measurement techniques. Follow-up is scheduled in one month with home blood pressure readings and cuff.  Kidney function is well-managed with no significant proteinuria, though occasional small amounts have been detected in past urinalyses. There is no evidence of acute kidney injury. Blood pressure management is crucial to prevent further kidney damage. A BMP is ordered to monitor kidney function. Continue current blood pressure management regimen.

## 2023-12-27 NOTE — Progress Notes (Signed)
 ==============================  [facility]: [phone]   -- Medical Office Visit --  Patient: Eric Crane      Age: 68 y.o.       Sex:  male  Date:   12/27/2023 Today's Healthcare Provider: Bernardino KANDICE Cone, MD  ==============================   Chief Complaint: Chronic Kidney Disease   Discussed the use of AI scribe software for clinical note transcription with the patient, who gave verbal consent to proceed.  History of Present Illness 68 year old male with hypertension and coronary artery disease who presents with erratic blood pressure readings.  He experiences erratic blood pressure readings at home, typically ranging from 140 to 170 mmHg, with occasional readings as low as 120 mmHg. His blood pressure readings are generally better in the morning. He takes metoprolol 25 mg XL, amlodipine  10 mg once daily at night, and doxazosin  4 mg twice daily. In the office, his blood pressure readings are consistently lower, often under 120 mmHg.  He has a history of trying various medications for blood pressure control, including an ARB and spironolactone , both of which were discontinued due to adverse effects or lack of efficacy. Spironolactone  worsened his blood pressure. He has not experienced low blood pressure readings.  He describes his home blood pressure monitoring routine, noting that he does not typically rest for five minutes before taking readings, which may contribute to higher readings. He uses two different blood pressure cuffs at home, which are calibrated within two points of each other.  He has a history of coronary artery disease and questions whether this could be related to his blood pressure issues. He underwent an MR angiogram, which showed no clogging of the arteries.  His schedule is erratic, often working late into the night, which affects his ability to take consistent morning readings. He is mindful of his salt intake and has  no other theories for his high blood pressure readings.  He has had stable kidney function with no significant proteinuria, although there were small amounts of protein detected in the past. His cholesterol levels were checked in September and were good with medication.  Background Reviewed: Problem List: has Primary osteoarthritis of right knee; Weight disorder; FH: heart disease; Osteoarthritis of lower back; Hyperlipidemia, acquired; Thrombocytopenia; Attention deficit hyperactivity disorder (ADHD); Bipolar 2 disorder (HCC); H/O total knee replacement; Prediabetes; Statin intolerance; Status post total right knee replacement; Pain in left hip; Medication management; Chronic kidney disease (CKD), active medical management without dialysis, stage 3 (moderate) (HCC); Hypertension; Coronary artery calcification; Elevated serum creatinine; Nephrolithiasis; Gallstones; Injury to kidney; Elevated PSA; Polycystic kidney disease; Adverse reaction to angiotensin 2 receptor antagonist; Spironolactone  adverse reaction; Lower extremity edema; and Acute kidney injury on their problem list. Past Medical History:  has a past medical history of Arthritis (2021), Bipolar 2 disorder (HCC) (11/20/2021), Chronic kidney disease (03/26/2023), Depression, FH: heart disease (11/20/2021), Hypertension (03/25/2024), Osteoarthritis of lower back (11/20/2021), Overweight (11/20/2021), and Thrombocytopenia (11/20/2021). Past Surgical History:   has a past surgical history that includes Colonoscopy with propofol  (N/A, 02/18/2016); Tonsillectomy; and Total knee arthroplasty (Right, 02/02/2022). Social History:   reports that he has never smoked. He has never used smokeless tobacco. He reports that he does not drink alcohol and does not use drugs. Family History:  family history includes Heart disease in his mother. Allergies:  is allergic to ace inhibitors and angiotensin receptor blockers.   Medication Reconciliation: Current  Outpatient Medications on File Prior to Visit  Medication Sig  amLODipine  (NORVASC ) 5 MG tablet Take 5 mg by mouth 2 (two) times daily.   aspirin  EC 81 MG tablet Take 1 tablet (81 mg total) by mouth daily. Swallow whole.   doxazosin  (CARDURA ) 4 MG tablet Take 4 mg by mouth 2 (two) times daily.   JARDIANCE  25 MG TABS tablet Take 1 tablet (25 mg total) by mouth daily.   lamoTRIgine  (LAMICTAL ) 200 MG tablet Take 200 mg by mouth daily.   Lurasidone HCl 120 MG TABS    metoprolol succinate (TOPROL-XL) 25 MG 24 hr tablet Take 12.5 mg by mouth 2 (two) times daily. (Patient taking differently: Take 25 mg by mouth 2 (two) times daily.)   QUEtiapine  Fumarate (SEROQUEL  XR) 150 MG 24 hr tablet Take 300 mg by mouth at bedtime.   rosuvastatin  (CRESTOR ) 20 MG tablet Take 1 tablet (20 mg total) by mouth daily.   amphetamine -dextroamphetamine  (ADDERALL) 20 MG tablet Take 1 tablet (20 mg total) by mouth 3 (three) times daily. (10-06-23)   [START ON 02/14/2024] amphetamine -dextroamphetamine  (ADDERALL) 20 MG tablet Take 1 tablet (20 mg total) by mouth 3 (three) times daily.   amphetamine -dextroamphetamine  (ADDERALL) 20 MG tablet Take 1 tablet (20 mg total) by mouth 3 (three) times daily.   [START ON 01/15/2024] amphetamine -dextroamphetamine  (ADDERALL) 20 MG tablet Take 1 tablet (20 mg total) by mouth 3 (three) times daily.   No current facility-administered medications on file prior to visit.  There are no discontinued medications.   Physical Exam:    12/27/2023    9:45 AM 11/17/2023    8:37 AM 10/20/2023   10:55 AM  Vitals with BMI  Height 5' 7 5' 7 5' 7  Weight 173 lbs 174 lbs 170 lbs 10 oz  BMI 27.09 27.25 26.71  Systolic 118 120 869  Diastolic 62 58 72  Pulse 88 78 64  Vital signs reviewed.  Nursing notes reviewed. Weight trend reviewed. Physical Activity: Sufficiently Active (11/16/2023)   Exercise Vital Sign    Days of Exercise per Week: 5 days    Minutes of Exercise per Session: 30 min    General Appearance:  No acute distress appreciable.   Well-groomed, healthy-appearing male.  Well proportioned with no abnormal fat distribution.  Good muscle tone. Pulmonary:  Normal work of breathing at rest, no respiratory distress apparent. SpO2: 98 %  Musculoskeletal: All extremities are intact.  Neurological:  Awake, alert, oriented, and engaged.  No obvious focal neurological deficits or cognitive impairments.  Sensorium seems unclouded.   Speech is clear and coherent with logical content. Psychiatric:  Appropriate mood, pleasant and cooperative demeanor, thoughtful and engaged during the exam  Latest Reference Range & Units 04/26/23 15:32 05/03/23 10:50 05/10/23 13:29 05/17/23 16:21 05/28/23 11:34 06/07/23 16:17 06/20/23 12:47 06/30/23 09:39 07/09/23 08:28 07/23/23 14:40 07/29/23 11:32 08/05/23 14:01 08/11/23 09:14 08/19/23 14:17 08/23/23 10:51 08/30/23 11:54 09/15/23 16:15 10/01/23 10:46 10/06/23 09:04 10/18/23 09:03 11/01/23 13:56 11/17/23 09:11 11/18/23 10:20 12/01/23 09:32  Creatinine,U mg/dL  34.8 86.6 80.9  71.0  48.8 56.7 89.3 106.0 121.0  140.6 81.1 46.9  59.1  108.0 175.9  26.7 59.4  Total Protein, Urine 5 - 25 mg/dL 9          18  <4 (L)  16 6 <4 (L) 9 12 9 11  <4 (L)  7  Microalb, Ur mg/dL  <9.2 <9.2 <9.2  <9.2  <0.7 <0.7 2.0 (H) 1.2 2.1 (H)  2.6 (H) 0.9 <0.7  0.8  <0.7 1.3  <0.7 <0.7  MICROALB/CREAT RATIO  0.0 - 30.0 mg/g  Unable to calculate Unable to calculate Unable to calculate  Unable to calculate 7 (E) Unable to calculate Unable to calculate 22.4 11.3 17.2  18.2 11.2 Unable to calculate  14.2  Unable to calculate 7.4  Unable to calculate Unable to calculate  Creatinine, Urine 20 - 320 mg/dL 58          893  28  84 48 15 (L) 60 103 113 176 29  63  Protein/Creat Ratio 25 - 148 mg/g creat 155 (H)          170 (H)  NOTE  190 (H) 125 NOTE 150 (H) 117 80 63 NOTE  111  Total Protein, Urine-UPE24 Negative      NEGATIVE                     (L): Data is abnormally low (H): Data is  abnormally high (E): External lab result    09/21/2023    8:47 AM 08/11/2023    8:04 AM 06/30/2023    8:48 AM 04/09/2023    8:47 AM  PHQ 2/9 Scores  PHQ - 2 Score 0 0 0 0   No image results found. MR ANGIO ABDOMEN W WO CONTRAST Result Date: 12/01/2023 CLINICAL DATA:  Hypertension, renal disease EXAM: MRI ABDOMEN WITH CONTRAST TECHNIQUE: Multiplanar multisequence MR imaging of the abdomen was performed after the administration of intravenous contrast. Multiplanar image (3D post-processing) reconstructions and MIPs were obtained to evaluate the vascular anatomy. COMPARISON:  Renal artery Doppler 06/24/2023. CT AP, 05/04/2023. US  Renal, 04/28/2023. FINDINGS: VASCULAR Nonaneurysmal abdominal aorta. Patent splanchnic arterial (celiac, SMA and IMA) origins. Two (2) renal arteries are present. Widely patent origins without MRA evidence of renal artery stenosis. Patent aortic bifurcation, with nonaneurysmal and patent pelvic arteries. NONVASCULAR Lower chest: No pleural effusion Hepatobiliary: No mass or discrete parenchymal abnormality. No biliary ductal dilatation. Cholelithiasis within a nondistended gallbladder. Pancreas: No pancreatic ductal dilatation or surrounding inflammatory changes. Spleen:  Within normal limits in size and appearance. Adrenals/Urinary Tract: BILATERAL exophytic renal cysts, largest measuring 3.3 cm RIGHT and 3.6 cm LEFT. No hydronephrosis. Stomach/Bowel: Imaged bowel is nonobstructed. Lymphatic:  No pathologically enlarged lymph nodes. Other:  No ascites. Musculoskeletal: Dextroconvex thoracolumbar curvature. No discrete osseous abnormality IMPRESSION: 1. Widely patent renal artery origins without MRA evidence of stenosis. 2. BILATERAL renal cysts and cholelithiasis. Additional incidental, chronic and senescent findings as above. Electronically Signed   By: Thom Hall M.D.   On: 12/01/2023 15:09         ASSESSMENT & PLAN   Assessment & Plan Uncontrolled hypertension AKI (acute  kidney injury) Acute kidney injury Resistant hypertension   Blood pressure at home ranges from 140 to 170 mmHg, occasionally reaching 120 mmHg, while office readings are consistently around 118 mmHg. This discrepancy may be due to lack of relaxation before measurements. He is currently on metoprolol, amlodipine , and doxazosin , with previous trials of ARBs and spironolactone  proving unsuccessful. Suspected arterial stiffness, particularly in the renal arteries, may contribute to resistance, though there is no significant kidney injury despite occasional proteinuria. Ensure home blood pressure readings are taken after 5 minutes of rest. Consider switching amlodipine  to 5 mg twice daily instead of 10 mg once daily. A BMP is ordered to monitor kidney function. He received a handout on proper blood pressure measurement techniques. Follow-up is scheduled in one month with home blood pressure readings and cuff.  Kidney function is well-managed with no significant proteinuria, though occasional  small amounts have been detected in past urinalyses. There is no evidence of acute kidney injury. Blood pressure management is crucial to prevent further kidney damage. A BMP is ordered to monitor kidney function. Continue current blood pressure management regimen. Chronic kidney disease (CKD), active medical management without dialysis, stage 3 (moderate) (HCC) Lab Results  Component Value Date   GFR 54.07 (L) 12/27/2023   GFR 51.80 (L) 12/01/2023   GFR 53.17 (L) 11/17/2023   GFR 51.40 (L) 11/01/2023   GFR 47.38 (L) 10/18/2023   GFR 42.33 (L) 10/06/2023   GFR 42.03 (L) 10/01/2023   GFR 51.44 (L) 09/15/2023   GFR 51.03 (L) 08/30/2023   GFR 46.69 (L) 08/23/2023   GFR 25.74 (L) 08/19/2023   GFR 52.36 (L) 08/11/2023   GFR 51.92 (L) 08/05/2023   GFR 47.45 (L) 07/29/2023   GFR 32.95 (L) 07/23/2023   GFR 50.23 (L) 07/09/2023   GFR 50.24 (L) 06/30/2023   GFR 40.95 (L) 06/07/2023   GFR 46.77 (L) 05/28/2023   GFR  51.13 (L) 05/17/2023   GFR 64.40 05/10/2023   GFR 52.02 (L) 05/03/2023   GFR 39.87 (L) 04/26/2023   GFR 46.44 (L) 04/23/2023   GFR 58.44 (L) 04/09/2023   GFR 53.88 (L) 03/26/2023   GFR 61.76 03/23/2022   Lab Results  Component Value Date   EGFR 56 (L) 12/27/2023   EGFR 52 (L) 11/17/2023   EGFR 47 (L) 11/01/2023   EGFR 52 (L) 10/18/2023   EGFR 56 (L) 10/06/2023   EGFR 42 (L) 10/01/2023   EGFR 50 (L) 08/30/2023   EGFR 49 (L) 08/23/2023   EGFR 47 (L) 08/11/2023   EGFR 46 (L) 07/29/2023   EGFR 43 (L) 06/30/2023   EGFR 51.0 06/20/2023   EGFR 45 (L) 06/07/2023   EGFR 42 (L) 05/28/2023   EGFR 47 (L) 05/18/2023   EGFR 56 (L) 05/10/2023   EGFR 54 (L) 05/03/2023   EGFR 38 (L) 04/26/2023   EGFR 44 (L) 04/23/2023   EGFR 46 (L) 04/19/2023   EGFR 45 (L) 04/19/2023   EGFR 50 (L) 04/09/2023    CKD stage 3a, GFR 45-59 ml/min (HCC)   Will plan recheck pra soon. Home reads bad but office reads good, focused on ensuring home blood pressure technique is optimized and at our next visit he will bring his cuff to compare/validate/calibrate.   ORDER ASSOCIATIONS  #   DIAGNOSIS / CONDITION ICD-10 ENCOUNTER ORDER     ICD-10-CM   1. Uncontrolled hypertension  I10 Protein / creatinine ratio, urine    Cystatin C with Glomerular Filtration Rate, Estimated (eGFR)    Basic metabolic panel with GFR    Basic metabolic panel with GFR    Cystatin C with Glomerular Filtration Rate, Estimated (eGFR)    Protein / creatinine ratio, urine    2. AKI (acute kidney injury)  N17.9 Protein / creatinine ratio, urine    Cystatin C with Glomerular Filtration Rate, Estimated (eGFR)    Basic metabolic panel with GFR    Basic metabolic panel with GFR    Cystatin C with Glomerular Filtration Rate, Estimated (eGFR)    Protein / creatinine ratio, urine    3. Acute kidney injury  N17.9 Protein / creatinine ratio, urine    Cystatin C with Glomerular Filtration Rate, Estimated (eGFR)    Basic metabolic panel with  GFR    Basic metabolic panel with GFR    Cystatin C with Glomerular Filtration Rate, Estimated (eGFR)    Protein /  creatinine ratio, urine     Orders Placed This Encounter  Procedures   Protein / creatinine ratio, urine    Standing Status:   Future    Number of Occurrences:   1    Expiration Date:   12/26/2024   Cystatin C with Glomerular Filtration Rate, Estimated (eGFR)    Standing Status:   Future    Number of Occurrences:   1    Expiration Date:   12/26/2024   Basic metabolic panel with GFR    Standing Status:   Future    Number of Occurrences:   1    Expiration Date:   12/26/2024     This document was synthesized by artificial intelligence ([employer]) using HIPAA-compliant recording of the clinical interaction;   We discussed the use of AI scribe software for clinical note transcription with the patient, who gave verbal consent to proceed. additional Info: This encounter employed state-of-the-art, real-time, collaborative documentation. The patient actively reviewed and assisted in updating their electronic medical record on a shared screen, ensuring transparency and facilitating joint problem-solving for the problem list, overview, and plan. This approach promotes accurate, informed care. The treatment plan was discussed and reviewed in detail, including medication safety, potential side effects, and all patient questions. We confirmed understanding and comfort with the plan. Follow-up instructions were established, including contacting the office for any concerns, returning if symptoms worsen, persist, or new symptoms develop, and precautions for potential emergency department visits.

## 2023-12-29 ENCOUNTER — Ambulatory Visit: Payer: Self-pay | Admitting: Internal Medicine

## 2023-12-29 LAB — PROTEIN / CREATININE RATIO, URINE
Creatinine, Urine: 81 mg/dL (ref 20–320)
Protein/Creat Ratio: 123 mg/g{creat} (ref 25–148)
Protein/Creatinine Ratio: 0.123 mg/mg{creat} (ref 0.025–0.148)
Total Protein, Urine: 10 mg/dL (ref 5–25)

## 2023-12-29 LAB — CYSTATIN C WITH GLOMERULAR FILTRATION RATE, ESTIMATED (EGFR)
CYSTATIN C: 1.26 mg/L — ABNORMAL HIGH (ref 0.52–1.20)
eGFR: 56 mL/min/1.73m2 — ABNORMAL LOW (ref >=60)

## 2023-12-30 ENCOUNTER — Encounter: Payer: Self-pay | Admitting: Internal Medicine

## 2023-12-30 NOTE — Assessment & Plan Note (Signed)
 Lab Results  Component Value Date   GFR 54.07 (L) 12/27/2023   GFR 51.80 (L) 12/01/2023   GFR 53.17 (L) 11/17/2023   GFR 51.40 (L) 11/01/2023   GFR 47.38 (L) 10/18/2023   GFR 42.33 (L) 10/06/2023   GFR 42.03 (L) 10/01/2023   GFR 51.44 (L) 09/15/2023   GFR 51.03 (L) 08/30/2023   GFR 46.69 (L) 08/23/2023   GFR 25.74 (L) 08/19/2023   GFR 52.36 (L) 08/11/2023   GFR 51.92 (L) 08/05/2023   GFR 47.45 (L) 07/29/2023   GFR 32.95 (L) 07/23/2023   GFR 50.23 (L) 07/09/2023   GFR 50.24 (L) 06/30/2023   GFR 40.95 (L) 06/07/2023   GFR 46.77 (L) 05/28/2023   GFR 51.13 (L) 05/17/2023   GFR 64.40 05/10/2023   GFR 52.02 (L) 05/03/2023   GFR 39.87 (L) 04/26/2023   GFR 46.44 (L) 04/23/2023   GFR 58.44 (L) 04/09/2023   GFR 53.88 (L) 03/26/2023   GFR 61.76 03/23/2022   Lab Results  Component Value Date   EGFR 56 (L) 12/27/2023   EGFR 52 (L) 11/17/2023   EGFR 47 (L) 11/01/2023   EGFR 52 (L) 10/18/2023   EGFR 56 (L) 10/06/2023   EGFR 42 (L) 10/01/2023   EGFR 50 (L) 08/30/2023   EGFR 49 (L) 08/23/2023   EGFR 47 (L) 08/11/2023   EGFR 46 (L) 07/29/2023   EGFR 43 (L) 06/30/2023   EGFR 51.0 06/20/2023   EGFR 45 (L) 06/07/2023   EGFR 42 (L) 05/28/2023   EGFR 47 (L) 05/18/2023   EGFR 56 (L) 05/10/2023   EGFR 54 (L) 05/03/2023   EGFR 38 (L) 04/26/2023   EGFR 44 (L) 04/23/2023   EGFR 46 (L) 04/19/2023   EGFR 45 (L) 04/19/2023   EGFR 50 (L) 04/09/2023

## 2024-01-23 ENCOUNTER — Other Ambulatory Visit: Payer: Self-pay | Admitting: Internal Medicine

## 2024-01-23 DIAGNOSIS — I1A0 Resistant hypertension: Secondary | ICD-10-CM

## 2024-01-23 DIAGNOSIS — N183 Chronic kidney disease, stage 3 unspecified: Secondary | ICD-10-CM

## 2024-01-26 ENCOUNTER — Other Ambulatory Visit (HOSPITAL_BASED_OUTPATIENT_CLINIC_OR_DEPARTMENT_OTHER): Payer: Self-pay

## 2024-01-28 ENCOUNTER — Ambulatory Visit: Payer: Medicare HMO | Admitting: Orthopaedic Surgery

## 2024-01-28 ENCOUNTER — Ambulatory Visit: Admitting: Internal Medicine

## 2024-01-28 ENCOUNTER — Encounter: Payer: Self-pay | Admitting: Internal Medicine

## 2024-01-28 VITALS — BP 130/80 | HR 70 | Temp 98.0°F | Ht 67.0 in | Wt 175.2 lb

## 2024-01-28 DIAGNOSIS — N1832 Chronic kidney disease, stage 3b: Secondary | ICD-10-CM

## 2024-01-28 DIAGNOSIS — I10 Essential (primary) hypertension: Secondary | ICD-10-CM

## 2024-01-28 DIAGNOSIS — Z0000 Encounter for general adult medical examination without abnormal findings: Secondary | ICD-10-CM | POA: Diagnosis not present

## 2024-01-28 DIAGNOSIS — E785 Hyperlipidemia, unspecified: Secondary | ICD-10-CM | POA: Diagnosis not present

## 2024-01-28 DIAGNOSIS — N200 Calculus of kidney: Secondary | ICD-10-CM | POA: Diagnosis not present

## 2024-01-28 DIAGNOSIS — R7303 Prediabetes: Secondary | ICD-10-CM

## 2024-01-28 LAB — CBC WITH DIFFERENTIAL/PLATELET
Basophils Absolute: 0.1 K/uL (ref 0.0–0.1)
Basophils Relative: 1.2 % (ref 0.0–3.0)
Eosinophils Absolute: 0.4 K/uL (ref 0.0–0.7)
Eosinophils Relative: 6.5 % — ABNORMAL HIGH (ref 0.0–5.0)
HCT: 46.7 % (ref 39.0–52.0)
Hemoglobin: 15.6 g/dL (ref 13.0–17.0)
Lymphocytes Relative: 29.9 % (ref 12.0–46.0)
Lymphs Abs: 1.8 K/uL (ref 0.7–4.0)
MCHC: 33.5 g/dL (ref 30.0–36.0)
MCV: 87.3 fl (ref 78.0–100.0)
Monocytes Absolute: 0.8 K/uL (ref 0.1–1.0)
Monocytes Relative: 12.8 % — ABNORMAL HIGH (ref 3.0–12.0)
Neutro Abs: 2.9 K/uL (ref 1.4–7.7)
Neutrophils Relative %: 49.6 % (ref 43.0–77.0)
Platelets: 143 K/uL — ABNORMAL LOW (ref 150.0–400.0)
RBC: 5.35 Mil/uL (ref 4.22–5.81)
RDW: 14.3 % (ref 11.5–15.5)
WBC: 5.9 K/uL (ref 4.0–10.5)

## 2024-01-28 LAB — COMPREHENSIVE METABOLIC PANEL WITH GFR
ALT: 14 U/L (ref 3–53)
AST: 14 U/L (ref 5–37)
Albumin: 4.3 g/dL (ref 3.5–5.2)
Alkaline Phosphatase: 91 U/L (ref 39–117)
BUN: 22 mg/dL (ref 6–23)
CO2: 27 meq/L (ref 19–32)
Calcium: 9 mg/dL (ref 8.4–10.5)
Chloride: 103 meq/L (ref 96–112)
Creatinine, Ser: 1.69 mg/dL — ABNORMAL HIGH (ref 0.40–1.50)
GFR: 41.64 mL/min — ABNORMAL LOW
Glucose, Bld: 102 mg/dL — ABNORMAL HIGH (ref 70–99)
Potassium: 4.2 meq/L (ref 3.5–5.1)
Sodium: 138 meq/L (ref 135–145)
Total Bilirubin: 0.5 mg/dL (ref 0.2–1.2)
Total Protein: 6.8 g/dL (ref 6.0–8.3)

## 2024-01-28 LAB — HEMOGLOBIN A1C: Hgb A1c MFr Bld: 6.2 % (ref 4.6–6.5)

## 2024-01-28 MED ORDER — METOPROLOL SUCCINATE ER 50 MG PO TB24: 50.0000 mg | ORAL_TABLET | Freq: Every day | ORAL | 3 refills | Status: AC

## 2024-01-28 NOTE — Progress Notes (Signed)
 ==============================  [facility]: [phone]   -- Medical Office Visit --  Patient: Eric Crane      Age: 69 y.o.       Sex:  male  Date:   01/28/2024 Today's Healthcare Provider: Bernardino KANDICE Cone, MD  ==============================   Chief Complaint: Hypertension (States bp has been fine )  Discussed the use of AI scribe software for clinical note transcription with the patient, who gave verbal consent to proceed.  History of Present Illness   Eric Crane is a 69 year old male with hypertension and coronary artery disease who presents for blood pressure management.  He has been closely monitoring his blood pressure, with most readings in the 130s and occasionally in the 120s, but infrequent spikes into the 150s. His goal is to maintain blood pressure around 130. He is currently taking metoprolol  25 mg once daily, doxazosin  4 mg twice a day, and amlodipine  10 mg daily. Previous trials of spironolactone  and losartan  were not well tolerated. No adverse effects from metoprolol , and his heart rate remains stable in the high 50s to low 60s.  He has a history of coronary artery disease and is on daily aspirin  therapy. He also takes rosuvastatin  for cholesterol management, which has effectively lowered his cholesterol levels.  His kidney function has improved from previous stage four kidney disease, with recent GFR readings over 50. He takes Jardiance  for kidney health and has a history of kidney stones.  He has a history of prediabetes, but recent fingerstick tests have shown improvement. He is on a stable psychiatric medication regimen, including Adderall, lamotrigine , quetiapine , and lurasidone, which has been effective in managing his symptoms without cycling.      Background Reviewed: Problem List: has Primary osteoarthritis of right knee; Weight disorder; FH: heart disease; Osteoarthritis of lower back; Hyperlipidemia, acquired;  Thrombocytopenia; Attention deficit hyperactivity disorder (ADHD); Bipolar 2 disorder (HCC); H/O total knee replacement; Prediabetes; Statin intolerance; Status post total right knee replacement; Pain in left hip; Medication management; Chronic kidney disease, stage 3b (HCC); Hypertension; Coronary artery calcification; Elevated serum creatinine; Nephrolithiasis; Gallstones; Injury to kidney; Elevated PSA; Polycystic kidney disease; Adverse reaction to angiotensin 2 receptor antagonist; Spironolactone  adverse reaction; Lower extremity edema; and Acute kidney injury on their problem list. Past Medical History:  has a past medical history of Arthritis (2021), Bipolar 2 disorder (HCC) (11/20/2021), Chronic kidney disease (03/26/2023), Depression, FH: heart disease (11/20/2021), Hypertension (03/25/2024), Osteoarthritis of lower back (11/20/2021), Overweight (11/20/2021), and Thrombocytopenia (11/20/2021). Past Surgical History:   has a past surgical history that includes Colonoscopy with propofol  (N/A, 02/18/2016); Tonsillectomy; and Total knee arthroplasty (Right, 02/02/2022). Social History:   reports that he has never smoked. He has never used smokeless tobacco. He reports that he does not drink alcohol and does not use drugs. Family History:  family history includes Heart disease in his mother. Allergies:  is allergic to ace inhibitors and angiotensin receptor blockers.   Medication Reconciliation: Current Outpatient Medications on File Prior to Visit  Medication Sig   amLODipine  (NORVASC ) 10 MG tablet TAKE 1 TABLET BY MOUTH DAILY   [START ON 02/14/2024] amphetamine -dextroamphetamine  (ADDERALL) 20 MG tablet Take 1 tablet (20 mg total) by mouth 3 (three) times daily.   aspirin  EC 81 MG tablet Take 1 tablet (81 mg total) by mouth daily. Swallow whole.   doxazosin  (CARDURA ) 4 MG tablet Take 4 mg by mouth 2 (two) times daily.   JARDIANCE  25 MG TABS tablet Take 1 tablet (  25 mg total) by mouth daily.    lamoTRIgine  (LAMICTAL ) 200 MG tablet Take 200 mg by mouth daily.   Lurasidone HCl 120 MG TABS    QUEtiapine  Fumarate (SEROQUEL  XR) 150 MG 24 hr tablet Take 300 mg by mouth at bedtime.   rosuvastatin  (CRESTOR ) 20 MG tablet Take 1 tablet (20 mg total) by mouth daily.   No current facility-administered medications on file prior to visit.  There are no discontinued medications.   Physical Exam:    01/28/2024    8:27 AM 12/27/2023    9:45 AM 11/17/2023    8:37 AM  Vitals with BMI  Height 5' 7 5' 7 5' 7  Weight 175 lbs 3 oz 173 lbs 174 lbs  BMI 27.43 27.09 27.25  Systolic 130 118 879  Diastolic 80 62 58  Pulse 70 88 78  Vital signs reviewed.  Nursing notes reviewed. Weight trend reviewed. Physical Activity: Sufficiently Active (11/16/2023)   Exercise Vital Sign    Days of Exercise per Week: 5 days    Minutes of Exercise per Session: 30 min   General Appearance:  No acute distress appreciable.   Well-groomed, healthy-appearing male.  Well proportioned with no abnormal fat distribution.  Good muscle tone. Pulmonary:  Normal work of breathing at rest, no respiratory distress apparent. SpO2: 98 %  Musculoskeletal: All extremities are intact.  Neurological:  Awake, alert, oriented, and engaged.  No obvious focal neurological deficits or cognitive impairments.  Sensorium seems unclouded.   Speech is clear and coherent with logical content. Psychiatric:  Appropriate mood, pleasant and cooperative demeanor, thoughtful and engaged during the exam      09/21/2023    8:47 AM 08/11/2023    8:04 AM 06/30/2023    8:48 AM 04/09/2023    8:47 AM  PHQ 2/9 Scores  PHQ - 2 Score 0 0 0 0     No image results found. MR ANGIO ABDOMEN W WO CONTRAST Result Date: 12/01/2023 CLINICAL DATA:  Hypertension, renal disease EXAM: MRI ABDOMEN WITH CONTRAST TECHNIQUE: Multiplanar multisequence MR imaging of the abdomen was performed after the administration of intravenous contrast. Multiplanar image (3D  post-processing) reconstructions and MIPs were obtained to evaluate the vascular anatomy. COMPARISON:  Renal artery Doppler 06/24/2023. CT AP, 05/04/2023. US  Renal, 04/28/2023. FINDINGS: VASCULAR Nonaneurysmal abdominal aorta. Patent splanchnic arterial (celiac, SMA and IMA) origins. Two (2) renal arteries are present. Widely patent origins without MRA evidence of renal artery stenosis. Patent aortic bifurcation, with nonaneurysmal and patent pelvic arteries. NONVASCULAR Lower chest: No pleural effusion Hepatobiliary: No mass or discrete parenchymal abnormality. No biliary ductal dilatation. Cholelithiasis within a nondistended gallbladder. Pancreas: No pancreatic ductal dilatation or surrounding inflammatory changes. Spleen:  Within normal limits in size and appearance. Adrenals/Urinary Tract: BILATERAL exophytic renal cysts, largest measuring 3.3 cm RIGHT and 3.6 cm LEFT. No hydronephrosis. Stomach/Bowel: Imaged bowel is nonobstructed. Lymphatic:  No pathologically enlarged lymph nodes. Other:  No ascites. Musculoskeletal: Dextroconvex thoracolumbar curvature. No discrete osseous abnormality IMPRESSION: 1. Widely patent renal artery origins without MRA evidence of stenosis. 2. BILATERAL renal cysts and cholelithiasis. Additional incidental, chronic and senescent findings as above. Electronically Signed   By: Thom Hall M.D.   On: 12/01/2023 15:09         ASSESSMENT & PLAN   Assessment & Plan Primary hypertension Blood pressure readings have improved, mostly in the 130s with occasional readings in the 120s. The goal is to maintain blood pressure around 130 mmHg. He is sensitive to antihypertensive medications  and has declined spironolactone  and minoxidil  due to concerns about potassium levels and side effects. Currently, he is on metoprolol  25 mg once daily, which he tolerates well. Heart rate is stable in the higher 50s to low 60s. Metoprolol  has been increased to 50 mg to achieve tighter blood pressure  control, with the option to revert to 25 mg if not tolerated. It is important to monitor heart rate and blood pressure closely. Blood pressure management will be discussed with a nephrologist in February.  Check pra due to prior abnormal still suspicious for secondary hypertension. Prediabetes He reports stable blood glucose levels with fingerstick monitoring. Comprehensive blood work has been ordered to assess diabetes status. Chronic kidney disease, stage 3b (HCC) Kidney function has improved significantly over the past 4-5 months, with GFR readings consistently over 50. Previous concerns about stage 4 kidney disease have resolved. Current medications have been adjusted to avoid nephrotoxic agents like losartan  and spironolactone . Kidney function is well-managed, with no immediate concern for progression. Continue the current medication regimen and monitor kidney function regularly. Kidney stones Presence of kidney stones is likely related to past dehydration. No recent episodes of kidney stones have been reported. He is encouraged to increase water intake to prevent kidney stones. Hyperlipidemia, acquired Cholesterol levels have improved significantly with rosuvastatin , and recent cholesterol levels are well-controlled. Continue rosuvastatin  as prescribed. Healthcare maintenance Discussed the importance of recognizing heart attack symptoms and the need for immediate action, including taking aspirin  and seeking emergency care. Recommended considering a smartwatch for health monitoring, especially given his age and health conditions. Ensure a health alert system is set up for emergency situations and consider using a smartwatch for health monitoring.  ORDER ASSOCIATIONS  #   DIAGNOSIS / CONDITION ICD-10 ENCOUNTER ORDER     ICD-10-CM   1. Primary hypertension  I10 Protein / creatinine ratio, urine    metoprolol  succinate (TOPROL -XL) 50 MG 24 hr tablet    HgB A1c    Comprehensive metabolic panel  with GFR    CBC w/Diff    Aldosterone + renin activity w/ ratio    Protein / creatinine ratio, urine    2. Prediabetes  R73.03 Protein / creatinine ratio, urine    HgB A1c    Comprehensive metabolic panel with GFR    CBC w/Diff    Protein / creatinine ratio, urine    3. Chronic kidney disease, stage 3b (HCC)  N18.32 Protein / creatinine ratio, urine    Protein / creatinine ratio, urine    4. Kidney stones  N20.0     5. Hyperlipidemia, acquired  E78.5     6. Healthcare maintenance  Z00.00            Orders Placed in Encounter:   Lab Orders         Protein / creatinine ratio, urine         HgB A1c         Comprehensive metabolic panel with GFR         CBC w/Diff         Aldosterone + renin activity w/ ratio     Meds ordered this encounter  Medications   metoprolol  succinate (TOPROL -XL) 50 MG 24 hr tablet    Sig: Take 1 tablet (50 mg total) by mouth daily. Replaces 25 mg dose.    Dispense:  90 tablet    Refill:  3   Orders Placed This Encounter  Procedures   Protein / creatinine ratio, urine  Standing Status:   Future    Number of Occurrences:   1    Expiration Date:   01/27/2025   HgB A1c   Comprehensive metabolic panel with GFR   CBC w/Diff   Aldosterone + renin activity w/ ratio      This document was synthesized by artificial intelligence ([employer]bridge) using HIPAA-compliant recording of the clinical interaction;   We discussed the use of AI scribe software for clinical note transcription with the patient, who gave verbal consent to proceed. additional Info: This encounter employed state-of-the-art, real-time, collaborative documentation. The patient actively reviewed and assisted in updating their electronic medical record on a shared screen, ensuring transparency and facilitating joint problem-solving for the problem list, overview, and plan. This approach promotes accurate, informed care. The treatment plan was discussed and reviewed in detail, including medication  safety, potential side effects, and all patient questions. We confirmed understanding and comfort with the plan. Follow-up instructions were established, including contacting the office for any concerns, returning if symptoms worsen, persist, or new symptoms develop, and precautions for potential emergency department visits.

## 2024-01-29 NOTE — Patient Instructions (Signed)
 It was a pleasure seeing you today! Your health and satisfaction are our top priorities.  Bernardino Cone, MD  VISIT SUMMARY: Today, you visited to discuss your blood pressure management and overall health. We reviewed your current medications and made some adjustments to better control your blood pressure. We also discussed your kidney function, prediabetes, history of kidney stones, and cholesterol levels. Additionally, we talked about general health maintenance and emergency preparedness.  YOUR PLAN: -PRIMARY HYPERTENSION: Primary hypertension means high blood pressure without a known secondary cause. Your blood pressure readings have improved, but to achieve tighter control, we have increased your metoprolol  dose to 50 mg daily. If you experience any side effects, you can revert to 25 mg. Please continue to monitor your blood pressure and heart rate closely. We will discuss your blood pressure management with a nephrologist in February.  -CHRONIC KIDNEY DISEASE, STAGE 3B: Chronic kidney disease stage 3b means your kidneys are moderately damaged, but your kidney function has improved significantly. Your GFR readings are consistently over 50, and we will continue your current medication regimen while monitoring your kidney function regularly.  -PREDIABETES: Prediabetes means your blood sugar levels are higher than normal but not high enough to be classified as diabetes. Your blood glucose levels have been stable, and we have ordered comprehensive blood work to assess your diabetes status.  -CALCULUS OF KIDNEY: Kidney stones are hard deposits made of minerals and salts that form inside your kidneys. You have not had recent episodes, but to prevent future stones, please increase your water intake.  -HYPERLIPIDEMIA: Hyperlipidemia means having high levels of fats (lipids) in your blood. Your cholesterol levels have improved with rosuvastatin , so please continue taking it as prescribed.  -GENERAL HEALTH  MAINTENANCE: We discussed the importance of recognizing heart attack symptoms and taking immediate action, including taking aspirin  and seeking emergency care. Consider using a smartwatch for health monitoring and ensure you have a health alert system set up for emergencies.  INSTRUCTIONS: Please follow up with the nephrologist in February to discuss your blood pressure management. Continue monitoring your blood pressure and heart rate closely, and report any significant changes or side effects. Complete the comprehensive blood work as ordered to assess your diabetes status. Increase your water intake to prevent kidney stones, and continue taking your medications as prescribed.  Your Providers PCP: Cone Bernardino MATSU, MD,  [phone]) Referring Provider: Cone Bernardino MATSU, MD,  [phone]) Care Team Provider: Jerri Kay HERO, MD,  [phone]) Care Team Provider: [name], MD,  [phone]) Care Team Provider: Persons, [name], [place],  [phone]) Care Team Provider: [name], MD,  [phone])  NEXT STEPS: [x]  Early Intervention: Schedule sooner appointment, call our on-call services, or go to emergency room if there is any significant Increase in pain or discomfort New or worsening symptoms Sudden or severe changes in your health [x]  Flexible Follow-Up: We recommend a No follow-ups on file. for optimal routine care. This allows for progress monitoring and treatment adjustments. [x]  Preventive Care: Schedule your annual preventive care visit! It's typically covered by insurance and helps identify potential health issues early. [x]  Lab & X-ray Appointments: Incomplete tests scheduled today, or call to schedule. X-rays: [facility] (M-F, 8:30am-noon or 1pm-5pm). [x]  Medical Information Release: Sign a release form at front desk to obtain relevant medical information we don't have.  MAKING THE MOST OF OUR FOCUSED 20 MINUTE APPOINTMENTS: [x]   Clearly state  your top concerns at the beginning of the visit to focus our  discussion [x]   If you anticipate you will need more time, please inform the front desk during scheduling - we can book multiple appointments in the same week. [x]   If you have transportation problems- use our convenient video appointments or ask about transportation support. [x]   We can get down to business faster if you use MyChart to update information before the visit and submit non-urgent questions before your visit. Thank you for taking the time to provide details through MyChart.  Let our nurse know and she can import this information into your encounter documents.  Arrival and Wait Times: [x]   Arriving on time ensures that everyone receives prompt attention. [x]   Early morning (8a) and afternoon (1p) appointments tend to have shortest wait times. [x]   Unfortunately, we cannot delay appointments for late arrivals or hold slots during phone calls.  Getting Answers and Following Up [x]   Simple Questions & Concerns: For quick questions or basic follow-up after your visit, reach us  at [phone] or MyChart messaging. [x]   Complex Concerns: If your concern is more complex, scheduling an appointment might be best. Discuss this with the staff to find the most suitable option. [x]   Lab & Imaging Results: We'll contact you directly if results are abnormal or you don't use MyChart. Most normal results will be on MyChart within 2-3 business days, with a review message from Dr. Jesus. Haven't heard back in 2 weeks? Need results sooner? Contact us  at [phone]. [x]   Referrals: Our referral coordinator will manage specialist referrals. The specialist's office should contact you within 2 weeks to schedule an appointment. Call us  if you haven't heard from them after 2 weeks.  Staying Connected [x]   MyChart: Activate your MyChart for the fastest way to access results and message us . See the last page of this paperwork for instructions on  how to activate.  Bring to Your Next Appointment [x]   Medications: Please bring all your medication bottles to your next appointment to ensure we have an accurate record of your prescriptions. [x]   Health Diaries: If you're monitoring any health conditions at home, keeping a diary of your readings can be very helpful for discussions at your next appointment.  Billing [x]   X-ray & Lab Orders: These are billed by separate companies. Contact the invoicing company directly for questions or concerns. [x]   Visit Charges: Discuss any billing inquiries with our administrative services team.  Your Satisfaction Matters [x]   Share Your Experience: We strive for your satisfaction! If you have any complaints, or preferably compliments, please let Dr. Jesus know directly or contact our Practice Administrators, Manuelita Rubin or [employer], by asking at the front desk.   Reviewing Your Records [x]   Review this early draft of your clinical encounter notes below and the final encounter summary tomorrow on MyChart after its been completed.  All orders placed so far are visible here: Primary hypertension -     Protein / creatinine ratio, urine; Future -     Metoprolol  Succinate ER; Take 1 tablet (50 mg total) by mouth daily. Replaces 25 mg dose.  Dispense: 90 tablet; Refill: 3 -     Hemoglobin A1c -     Comprehensive metabolic panel with GFR -     CBC with Differential/Platelet -     Aldosterone + renin activity w/ ratio  Prediabetes -     Protein / creatinine ratio, urine; Future -     Hemoglobin A1c -     Comprehensive metabolic panel with GFR -  CBC with Differential/Platelet  Chronic kidney disease, stage 3b (HCC) -     Protein / creatinine ratio, urine; Future  Kidney stones  Hyperlipidemia, acquired  Healthcare maintenance

## 2024-01-29 NOTE — Assessment & Plan Note (Signed)
 Kidney function has improved significantly over the past 4-5 months, with GFR readings consistently over 50. Previous concerns about stage 4 kidney disease have resolved. Current medications have been adjusted to avoid nephrotoxic agents like losartan  and spironolactone . Kidney function is well-managed, with no immediate concern for progression. Continue the current medication regimen and monitor kidney function regularly.

## 2024-01-29 NOTE — Assessment & Plan Note (Signed)
 Blood pressure readings have improved, mostly in the 130s with occasional readings in the 120s. The goal is to maintain blood pressure around 130 mmHg. He is sensitive to antihypertensive medications and has declined spironolactone  and minoxidil  due to concerns about potassium levels and side effects. Currently, he is on metoprolol  25 mg once daily, which he tolerates well. Heart rate is stable in the higher 50s to low 60s. Metoprolol  has been increased to 50 mg to achieve tighter blood pressure control, with the option to revert to 25 mg if not tolerated. It is important to monitor heart rate and blood pressure closely. Blood pressure management will be discussed with a nephrologist in February.  Check pra due to prior abnormal still suspicious for secondary hypertension.

## 2024-01-29 NOTE — Assessment & Plan Note (Signed)
 Cholesterol levels have improved significantly with rosuvastatin , and recent cholesterol levels are well-controlled. Continue rosuvastatin  as prescribed.

## 2024-01-29 NOTE — Assessment & Plan Note (Signed)
 He reports stable blood glucose levels with fingerstick monitoring. Comprehensive blood work has been ordered to assess diabetes status.

## 2024-01-30 ENCOUNTER — Ambulatory Visit: Payer: Self-pay | Admitting: Internal Medicine

## 2024-01-30 DIAGNOSIS — N179 Acute kidney failure, unspecified: Secondary | ICD-10-CM

## 2024-02-01 LAB — PROTEIN / CREATININE RATIO, URINE
Creatinine, Urine: 23 mg/dL (ref 20–320)
Total Protein, Urine: <4 mg/dL — ABNORMAL LOW (ref 5–25)

## 2024-02-01 LAB — ALDOSTERONE + RENIN ACTIVITY W/ RATIO
ALDO / PRA Ratio: 5 ratio (ref 0.9–28.9)
Aldosterone: 10 ng/dL
Renin Activity: 2 ng/mL/h (ref 0.25–5.82)

## 2024-02-03 ENCOUNTER — Other Ambulatory Visit (HOSPITAL_BASED_OUTPATIENT_CLINIC_OR_DEPARTMENT_OTHER): Payer: Self-pay

## 2024-02-03 ENCOUNTER — Ambulatory Visit: Admitting: Orthopaedic Surgery

## 2024-02-03 MED ORDER — LORAZEPAM 0.5 MG PO TABS
0.5000 mg | ORAL_TABLET | Freq: Every day | ORAL | 0 refills | Status: AC | PRN
  Filled 2024-02-03: qty 15, 15d supply, fill #0

## 2024-02-04 ENCOUNTER — Other Ambulatory Visit (HOSPITAL_BASED_OUTPATIENT_CLINIC_OR_DEPARTMENT_OTHER): Payer: Self-pay

## 2024-02-04 ENCOUNTER — Other Ambulatory Visit (INDEPENDENT_AMBULATORY_CARE_PROVIDER_SITE_OTHER)

## 2024-02-04 DIAGNOSIS — N179 Acute kidney failure, unspecified: Secondary | ICD-10-CM | POA: Diagnosis not present

## 2024-02-04 DIAGNOSIS — N183 Chronic kidney disease, stage 3 unspecified: Secondary | ICD-10-CM | POA: Diagnosis not present

## 2024-02-04 DIAGNOSIS — I1A0 Resistant hypertension: Secondary | ICD-10-CM

## 2024-02-04 LAB — BASIC METABOLIC PANEL WITH GFR
BUN: 19 mg/dL (ref 6–23)
CO2: 25 meq/L (ref 19–32)
Calcium: 8.9 mg/dL (ref 8.4–10.5)
Chloride: 104 meq/L (ref 96–112)
Creatinine, Ser: 1.32 mg/dL (ref 0.40–1.50)
GFR: 56 mL/min — ABNORMAL LOW
Glucose, Bld: 112 mg/dL — ABNORMAL HIGH (ref 70–99)
Potassium: 4 meq/L (ref 3.5–5.1)
Sodium: 136 meq/L (ref 135–145)

## 2024-02-04 LAB — MICROALBUMIN / CREATININE URINE RATIO
Creatinine,U: 26.9 mg/dL
Microalb Creat Ratio: UNDETERMINED mg/g (ref 0.0–30.0)
Microalb, Ur: <0.7 mg/dL

## 2024-02-05 ENCOUNTER — Ambulatory Visit: Payer: Self-pay | Admitting: Internal Medicine

## 2024-02-05 LAB — PROTEIN / CREATININE RATIO, URINE
Creatinine, Urine: 26 mg/dL (ref 20–320)
Total Protein, Urine: <4 mg/dL — ABNORMAL LOW (ref 5–25)

## 2024-02-17 ENCOUNTER — Other Ambulatory Visit (HOSPITAL_BASED_OUTPATIENT_CLINIC_OR_DEPARTMENT_OTHER): Payer: Self-pay

## 2024-02-18 ENCOUNTER — Other Ambulatory Visit (HOSPITAL_BASED_OUTPATIENT_CLINIC_OR_DEPARTMENT_OTHER): Payer: Self-pay

## 2024-03-27 ENCOUNTER — Ambulatory Visit: Admitting: Internal Medicine

## 2024-03-27 ENCOUNTER — Encounter: Payer: Medicare HMO | Admitting: Internal Medicine

## 2024-09-25 ENCOUNTER — Ambulatory Visit
